# Patient Record
Sex: Male | Born: 1995 | Hispanic: No | Marital: Single | Smoking: Never smoker
Health system: Southern US, Community
[De-identification: ages and names within clinical notes are randomized; demographics above are authoritative.]

## PROBLEM LIST (undated history)

## (undated) DIAGNOSIS — K219 Gastro-esophageal reflux disease without esophagitis: Secondary | ICD-10-CM

## (undated) HISTORY — PX: BRAIN SURGERY: SHX531

## (undated) HISTORY — PX: NO PAST SURGERIES: SHX2092

## (undated) HISTORY — PX: WISDOM TOOTH EXTRACTION: SHX21

## (undated) HISTORY — PX: COLONOSCOPY: SHX174

---
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
## 2014-05-23 ENCOUNTER — Emergency Department (HOSPITAL_BASED_OUTPATIENT_CLINIC_OR_DEPARTMENT_OTHER): Payer: Self-pay

## 2014-05-23 ENCOUNTER — Encounter (HOSPITAL_BASED_OUTPATIENT_CLINIC_OR_DEPARTMENT_OTHER): Payer: Self-pay | Admitting: *Deleted

## 2014-05-23 ENCOUNTER — Emergency Department (HOSPITAL_BASED_OUTPATIENT_CLINIC_OR_DEPARTMENT_OTHER)
Admission: EM | Admit: 2014-05-23 | Discharge: 2014-05-23 | Disposition: A | Discharge status: Home / Self Care | Payer: Self-pay | Attending: Emergency Medicine | Admitting: Emergency Medicine

## 2014-05-23 DIAGNOSIS — Z880 Allergy status to penicillin: Secondary | ICD-10-CM | POA: Insufficient documentation

## 2014-05-23 DIAGNOSIS — S93401A Sprain of unspecified ligament of right ankle, initial encounter: Secondary | ICD-10-CM | POA: Insufficient documentation

## 2014-05-23 DIAGNOSIS — T1490XA Injury, unspecified, initial encounter: Secondary | ICD-10-CM

## 2014-05-23 DIAGNOSIS — Y9289 Other specified places as the place of occurrence of the external cause: Secondary | ICD-10-CM | POA: Insufficient documentation

## 2014-05-23 DIAGNOSIS — Y9367 Activity, basketball: Secondary | ICD-10-CM | POA: Insufficient documentation

## 2014-05-23 DIAGNOSIS — Y9389 Activity, other specified: Secondary | ICD-10-CM | POA: Insufficient documentation

## 2014-05-23 DIAGNOSIS — Y998 Other external cause status: Secondary | ICD-10-CM | POA: Insufficient documentation

## 2014-05-23 DIAGNOSIS — Z791 Long term (current) use of non-steroidal anti-inflammatories (NSAID): Secondary | ICD-10-CM | POA: Insufficient documentation

## 2014-05-23 DIAGNOSIS — X58XXXA Exposure to other specified factors, initial encounter: Secondary | ICD-10-CM | POA: Insufficient documentation

## 2014-05-23 MED ORDER — NAPROXEN 500 MG PO TABS: 500.0000 mg | ORAL_TABLET | Freq: Two times a day (BID) | ORAL | Status: DC

## 2014-05-23 NOTE — ED Provider Notes (Signed)
CSN: [id number]     Arrival date & time 05/23/14  1246 History   First MD Initiated Contact with Patient 05/23/14 1249     Chief Complaint  Patient presents with  . Foot Injury     (Consider location/radiation/quality/duration/timing/severity/associated sxs/prior Treatment) HPI Comments: Patient presents with complaint of right ankle and foot injury sustained yesterday at 3 PM while playing basketball. Patient states that he was going for layup when he came down he stepped on a brick and twisted his ankle. He denies other injury. Patient complains of pain, swelling, bruising to the area on the outside of the ankle. He is having difficulty bearing weight on the foot because of pain. He has been using crutches that he has had from previous injury. No other treatments prior to arrival. Onset of symptoms acute. Course is constant.  Patient is a 18 y.o. male presenting with foot injury. The history is provided by the patient.  Foot Injury Associated symptoms: no back pain and no neck pain     History reviewed. No pertinent past medical history. History reviewed. No pertinent past surgical history. History reviewed. No pertinent family history. History  Substance Use Topics  . Smoking status: Never Smoker   . Smokeless tobacco: Not on file  . Alcohol Use: No    Review of Systems  Constitutional: Negative for activity change.  Musculoskeletal: Positive for joint swelling and arthralgias. Negative for back pain, gait problem and neck pain.  Skin: Negative for wound.  Neurological: Negative for weakness and numbness.      Allergies  Penicillins  Home Medications   Prior to Admission medications   Medication Sig Start Date End Date Taking? Authorizing Provider  naproxen (NAPROSYN) 500 MG tablet Take 1 tablet (500 mg total) by mouth 2 (two) times daily. 05/23/14   Renne CriglerJoshua Mehlani Blankenburg, PA-C   BP 152/93 mmHg  Pulse 86  Temp(Src) 98.3 F (36.8 C)  Resp 16  Ht 5\' 11"  (1.803 m)  Wt 202 lb  (91.627 kg)  BMI 28.19 kg/m2  SpO2 99% Physical Exam  Constitutional: He appears well-developed and well-nourished.  HENT:  Head: Normocephalic and atraumatic.  Eyes: Conjunctivae are normal.  Neck: Normal range of motion. Neck supple.  Cardiovascular:  Pulses:      Dorsalis pedis pulses are 2+ on the right side, and 2+ on the left side.       Posterior tibial pulses are 2+ on the right side, and 2+ on the left side.  Pulmonary/Chest: No respiratory distress.  Musculoskeletal: He exhibits edema and tenderness.  Patient complains of pain with palpation of the lateral right ankle. Swelling over lateral malleolus. Mild laxity noted. He denies pain with palpation over the fibular head of the affected side. He denies pain in the hip of the affected side.  Neurological: He is alert.  Distal motor, sensation, and vascular intact.  Skin: Skin is warm and dry.  Psychiatric: He has a normal mood and affect.  Vitals reviewed.   ED Course  Procedures (including critical care time) Labs Review Labs Reviewed - No data to display  Imaging Review Dg Ankle Complete Right  05/23/2014   CLINICAL DATA:  Ankle injury, trauma  EXAM: RIGHT ANKLE - COMPLETE 3+ VIEW  COMPARISON:  None.  FINDINGS: There is no evidence of fracture, dislocation, or joint effusion. There is no evidence of arthropathy or other focal bone abnormality. Lateral greater than medial soft tissue swelling noted.  IMPRESSION: Lateral greater than medial soft tissue swelling which may  indicate ligamentous injury or occult fracture.   Electronically Signed   By: Christiana PellantGretchen  Green M.D.   On: 05/23/2014 13:23     EKG Interpretation None       1:43 PM Patient seen and examined.    Vital signs reviewed and are as follows: BP 152/93 mmHg  Pulse 86  Temp(Src) 98.3 F (36.8 C)  Resp 16  Ht 5\' 11"  (1.803 m)  Wt 202 lb (91.627 kg)  BMI 28.19 kg/m2  SpO2 99%  Patient was counseled on RICE protocol and told to rest injury, use ice for  no longer than 15 minutes every hour, compress the area, and elevate above the level of their heart as much as possible to reduce swelling. Questions answered. Patient verbalized understanding.    Encouraged to continue crutches. ASO given in emergency department. Patient encouraged to follow-up with orthopedic referral if no improvement in one week.   MDM   Final diagnoses:  Ankle sprain, right, initial encounter   Patient with ankle injury, foot is neurovascularly intact, x-rays are negative. Will treat as sprain. Orthopedic follow-up given if no improvement in one week.    Renne CriglerJoshua Tyra Michelle, PA-C 05/23/14 1346  Enid SkeensJoshua M Zavitz, MD 05/23/14 [phone]

## 2014-05-23 NOTE — Discharge Instructions (Signed)
Please read and follow all provided instructions.  Your diagnoses today include:  1. Ankle sprain, right, initial encounter   2. Injury    Tests performed today include:  An x-ray of your ankle - does NOT show any broken bones  Vital signs. See below for your results today.   Medications prescribed:   Naproxen - anti-inflammatory pain medication  Do not exceed 500mg  naproxen every 12 hours, take with food  You have been prescribed an anti-inflammatory medication or NSAID. Take with food. Take smallest effective dose for the shortest duration needed for your pain. Stop taking if you experience stomach pain or vomiting.   Take any prescribed medications only as directed.  Home care instructions:   Follow any educational materials contained in this packet  Follow R.I.C.E. Protocol:  R - rest your injury   I  - use ice on injury without applying directly to skin  C - compress injury with bandage or splint  E - elevate the injury as much as possible  Follow-up instructions: Please follow-up with your primary care provider or the provided orthopedic (bone specialist) if you continue to have significant pain or trouble walking in 1 week. In this case you may have a severe sprain that requires further care.   Return instructions:   Please return if your toes are numb or tingling, appear gray or blue, or you have severe pain (also elevate leg and loosen splint or wrap)  Please return to the Emergency Department if you experience worsening symptoms.   Please return if you have any other emergent concerns.  Additional Information:  Your vital signs today were: BP 152/93 mmHg   Pulse 86   Temp(Src) 98.3 F (36.8 C)   Resp 16   Ht 5\' 11"  (1.803 m)   Wt 202 lb (91.627 kg)   BMI 28.19 kg/m2   SpO2 99% If your blood pressure (BP) was elevated above 135/85 this visit, please have this repeated by your doctor within one month. -------------- Your caregiver has diagnosed you as  suffering from an ankle sprain. Ankle sprain occurs when the ligaments that hold the ankle joint together are stretched or torn. It may take 4 to 6 weeks to heal.  For Activity: If prescribed crutches, use crutches with non-weight bearing for the first few days. Then, you may walk on your ankle as the pain allows, or as instructed. Start gradually with weight bearing on the affected ankle. Once you can walk pain free, then try jogging. When you can run forwards, then you can try moving side-to-side. If you cannot walk without crutches in one week, you need a re-check. --------------

## 2014-05-23 NOTE — ED Notes (Signed)
Pt c/o right ankle injury x 1 day ago 

## 2016-11-04 ENCOUNTER — Emergency Department (HOSPITAL_BASED_OUTPATIENT_CLINIC_OR_DEPARTMENT_OTHER): Payer: Self-pay

## 2016-11-04 ENCOUNTER — Emergency Department (HOSPITAL_BASED_OUTPATIENT_CLINIC_OR_DEPARTMENT_OTHER)
Admission: EM | Admit: 2016-11-04 | Discharge: 2016-11-04 | Disposition: A | Discharge status: Home / Self Care | Payer: Self-pay | Attending: Emergency Medicine | Admitting: Emergency Medicine

## 2016-11-04 ENCOUNTER — Encounter (HOSPITAL_BASED_OUTPATIENT_CLINIC_OR_DEPARTMENT_OTHER): Payer: Self-pay | Admitting: *Deleted

## 2016-11-04 DIAGNOSIS — Y9367 Activity, basketball: Secondary | ICD-10-CM | POA: Insufficient documentation

## 2016-11-04 DIAGNOSIS — X501XXA Overexertion from prolonged static or awkward postures, initial encounter: Secondary | ICD-10-CM | POA: Insufficient documentation

## 2016-11-04 DIAGNOSIS — Y929 Unspecified place or not applicable: Secondary | ICD-10-CM | POA: Insufficient documentation

## 2016-11-04 DIAGNOSIS — Y999 Unspecified external cause status: Secondary | ICD-10-CM | POA: Insufficient documentation

## 2016-11-04 DIAGNOSIS — S8992XA Unspecified injury of left lower leg, initial encounter: Secondary | ICD-10-CM | POA: Insufficient documentation

## 2016-11-04 MED ORDER — IBUPROFEN 800 MG PO TABS: 800.0000 mg | ORAL_TABLET | Freq: Three times a day (TID) | ORAL | 0 refills | Status: DC | PRN

## 2016-11-04 MED ORDER — DOCUSATE SODIUM 100 MG PO CAPS: 100.0000 mg | ORAL_CAPSULE | Freq: Two times a day (BID) | ORAL | 0 refills | Status: DC

## 2016-11-04 MED ORDER — OXYCODONE-ACETAMINOPHEN 5-325 MG PO TABS: 1.0000 | ORAL_TABLET | Freq: Four times a day (QID) | ORAL | 0 refills | Status: DC | PRN

## 2016-11-04 MED ORDER — OXYCODONE-ACETAMINOPHEN 5-325 MG PO TABS
2.0000 | ORAL_TABLET | Freq: Once | ORAL | Status: AC
  Administered 2016-11-04: 2 via ORAL
  Filled 2016-11-04: qty 2

## 2016-11-04 MED ORDER — IBUPROFEN 800 MG PO TABS
800.0000 mg | ORAL_TABLET | Freq: Once | ORAL | Status: AC
  Administered 2016-11-04: 800 mg via ORAL
  Filled 2016-11-04: qty 1

## 2016-11-04 NOTE — Discharge Instructions (Signed)
You are being provided a prescription for opiates (also known as narcotics) for pain control.  Opiates can be addictive and should only be used when absolutely necessary for pain control when other alternatives do not work.  We recommend you only use them for the recommended amount of time and only as prescribed.  Please do not take with other sedative medications or alcohol.  Please do not drive, operate machinery, make important decisions while taking opiates.  Please note that these medications can be addictive and have high abuse potential.  Please keep these medications locked away from children, teenagers or any family members with history of substance abuse.     Narcotics may also make you constipated. This is why we are prescribing you a stool softener to take twice a day. If this does not help with bowel movements, you may use over-the-counter MiraLAX as well.   Your x-ray showed no fracture. I am concerned however for a possible anterior cruciate ligament injury and have recommended close follow-up with orthopedic doctor for further evaluation, possible MRI.

## 2016-11-04 NOTE — ED Provider Notes (Signed)
TIME SEEN: 3:48 AM  CHIEF COMPLAINT: Left knee pain  HPI: Patient is a 21 year old male with no significant past medical history who presents to the emergency department with left knee pain. Reports he was playing basketball prior to arrival and jumped up for a shot and then came down, planted his foot and twisted his left knee. States he felt a pop and had immediate swelling to the left knee and has now been able to ambulate since. No ankle or hip pain. Did not follow the ground or strike his head. No neck or back pain.  ROS: See HPI Constitutional: no fever  Eyes: no drainage  ENT: no runny nose   Cardiovascular:  no chest pain  Resp: no SOB  GI: no vomiting GU: no dysuria Integumentary: no rash  Allergy: no hives  Musculoskeletal: no leg swelling  Neurological: no slurred speech ROS otherwise negative  PAST MEDICAL HISTORY/PAST SURGICAL HISTORY:  History reviewed. No pertinent past medical history.  MEDICATIONS:  Prior to Admission medications   Medication Sig Start Date End Date Taking? Authorizing Provider  naproxen (NAPROSYN) 500 MG tablet Take 1 tablet (500 mg total) by mouth 2 (two) times daily. 05/23/14   Renne Crigler, PA-C    ALLERGIES:  Allergies  Allergen Reactions  . Penicillins     SOCIAL HISTORY:  Social History  Substance Use Topics  . Smoking status: Never Smoker  . Smokeless tobacco: Never Used  . Alcohol use No    FAMILY HISTORY: No family history on file.  EXAM: BP 134/64 (BP Location: Right Arm)   Pulse 79   Temp 98.3 F (36.8 C) (Oral)   Resp 16   Ht 6' (1.829 m)   Wt 225 lb (102.1 kg)   SpO2 98%   BMI 30.52 kg/m  CONSTITUTIONAL: Alert and oriented and responds appropriately to questions. Well-appearing; well-nourished; GCS 15 HEAD: Normocephalic; atraumatic EYES: Conjunctivae clear, PERRL, EOMI ENT: normal nose; no rhinorrhea; moist mucous membranes; pharynx without lesions noted; no dental injury; no septal hematoma NECK: Supple,  no meningismus, no LAD; no midline spinal tenderness, step-off or deformity; trachea midline CARD: RRR; S1 and S2 appreciated; no murmurs, no clicks, no rubs, no gallops RESP: Normal chest excursion without splinting or tachypnea; breath sounds clear and equal bilaterally; no wheezes, no rhonchi, no rales; no hypoxia or respiratory distress CHEST:  chest wall stable, no crepitus or ecchymosis or deformity, nontender to palpation; no flail chest ABD/GI: Normal bowel sounds; non-distended; soft, non-tender, no rebound, no guarding; no ecchymosis or other lesions noted PELVIS:  stable, nontender to palpation BACK:  The back appears normal and is non-tender to palpation, there is no CVA tenderness; no midline spinal tenderness, step-off or deformity EXT: Tender to palpation diffusely throughout the anterior left knee with associated mild-to-moderate joint effusion. He seems to have some ligamentous laxity with Lachman's test on the left. No other ligamentous laxity noted. 2+ DP pulses bilaterally. No tenderness over the left hip, left ankle. Patient is able to fully flex and extend this knee although it is painful. Normal ROM in all joints; otherwise extremities are non-tender to palpation; no edema; normal capillary refill; no cyanosis, otherwise no bony tenderness or bony deformity of patient's extremities, otherwise no joint effusion, compartments are soft, extremities are warm and well-perfused, no ecchymosis SKIN: Normal color for age and race; warm NEURO: Moves all extremities equally, sensation to light touch intact diffusely, cranial nerves II through XII intact, normal speech PSYCH: The patient's mood and manner are  appropriate. Grooming and personal hygiene are appropriate.  MEDICAL DECISION MAKING: Patient here with left knee injury. Concern for possible anterior cruciate ligament take a given mechanism and that he has some ligamentous laxity with Lachman's test compared to the right side. X-ray  shows no acute abnormality. We'll place him in a knee immobilizer and discharge with prescription for pain medication. We'll keep him nonweightbearing and provided crutches. Have recommended close follow-up with orthopedic surgeon as he may need an MRI of this joint. Discussed return precautions. He is neurovascular intact distally. No other sign of trauma on exam.   At this time, I do not feel there is any life-threatening condition present. I have reviewed and discussed all results (EKG, imaging, lab, urine as appropriate) and exam findings with patient/family. I have reviewed nursing notes and appropriate previous records.  I feel the patient is safe to be discharged home without further emergent workup and can continue workup as an outpatient as needed. Discussed usual and customary return precautions. Patient/family verbalize understanding and are comfortable with this plan.  Outpatient follow-up has been provided if needed. All questions have been answered.    SPLINT APPLICATION Authorized by: Raelyn NumberWARD, Theran Vandergrift N Consent: Verbal consent obtained. Risks and benefits: risks, benefits and alternatives were discussed Consent given by: patient Splint applied by: technician Location details: left leg Splint type: Knee immobilizer  Supplies used: Knee immobilizer  Post-procedure: The splinted body part was neurovascularly unchanged following the procedure. Patient tolerance: Patient tolerated the procedure well with no immediate complications.          Abia Monaco, Layla MawKristen N, DO 11/04/16 [phone]

## 2016-11-04 NOTE — ED Notes (Signed)
PMS intact before and after. Pt tolerated well. All questions answered. 

## 2016-11-08 ENCOUNTER — Other Ambulatory Visit: Payer: Self-pay | Admitting: Orthopedic Surgery

## 2016-11-08 DIAGNOSIS — S8992XA Unspecified injury of left lower leg, initial encounter: Secondary | ICD-10-CM

## 2016-11-13 ENCOUNTER — Ambulatory Visit: Payer: Self-pay | Attending: Internal Medicine | Admitting: Internal Medicine

## 2016-11-13 ENCOUNTER — Encounter: Payer: Self-pay | Admitting: Internal Medicine

## 2016-11-13 VITALS — BP 120/80 | HR 73 | Temp 97.4°F | Resp 16 | Wt 222.4 lb

## 2016-11-13 DIAGNOSIS — M25562 Pain in left knee: Secondary | ICD-10-CM | POA: Insufficient documentation

## 2016-11-13 DIAGNOSIS — S8992XA Unspecified injury of left lower leg, initial encounter: Secondary | ICD-10-CM | POA: Insufficient documentation

## 2016-11-13 DIAGNOSIS — Z880 Allergy status to penicillin: Secondary | ICD-10-CM | POA: Insufficient documentation

## 2016-11-13 DIAGNOSIS — M25462 Effusion, left knee: Secondary | ICD-10-CM | POA: Insufficient documentation

## 2016-11-13 NOTE — Progress Notes (Signed)
Patient ID: Karl Flores, male    DOB: 11-26-1995  MRN: [id number]  CC:  [email or web address]   Subjective:  Karl Flores is a 21 y.o. male who presents for new patient visit and follow-up ER visit. Mother is with him. Patient speaks [place]. His concerns today include: Patient was seen in the ER on 11/04/2016 with complaint of left knee pain. Pain started after he jumped up to make her short in basketball and then came down with a twisting of the knee. Knee was noted to be swollen and he did have some ligamentous laxity with Lachman's test.   -x-ray negative for fracture -Patient sent home with knee brace, crutches and pain medication. Told to follow up with orthopedics.  -swelling decreased slightly -saw ortho 5 days later and was told he most likely has torn ACL.   Told to use brace PRN, and slight wgh bearing with crutches.  Told he will most likely need surgery -MRI 11/21/2016 -Patient is uninsured  2.  BP noted to be elevated in the ER and also today. Fhx of BP in dad -no HA/dizziness/CP/SOB    Current Outpatient Prescriptions on File Prior to Visit  Medication Sig Dispense Refill  . docusate sodium (COLACE) 100 MG capsule Take 1 capsule (100 mg total) by mouth every 12 (twelve) hours. (Patient not taking: Reported on 11/13/2016) 60 capsule 0  . ibuprofen (ADVIL,MOTRIN) 800 MG tablet Take 1 tablet (800 mg total) by mouth every 8 (eight) hours as needed for mild pain. (Patient not taking: Reported on 11/13/2016) 30 tablet 0  . oxyCODONE-acetaminophen (PERCOCET/ROXICET) 5-325 MG tablet Take 1-2 tablets by mouth every 6 (six) hours as needed for severe pain. (Patient not taking: Reported on 11/13/2016) 20 tablet 0   No current facility-administered medications on file prior to visit.     Allergies  Allergen Reactions  . Penicillins      ROS: Review of Systems  Constitutional: Negative.   Cardiovascular: Negative for chest pain.  Neurological: Negative for dizziness and  light-headedness.     PHYSICAL EXAM: BP 120/80   Pulse 73   Temp 97.4 F (36.3 C) (Oral)   Resp 16   Wt 222 lb 6.4 oz (100.9 kg)   SpO2 98%   BMI 30.16 kg/m   Physical Exam  General appearance - alert, well appearing, young male and in no distress Mental status - normal mood, behavior, speech, dress, motor activity, and thought processes Chest - clear to auscultation, no wheezes, rales or rhonchi, symmetric air entry Heart - normal rate, regular rhythm, normal S1, S2, no murmurs, rubs, clicks or gallops Musculoskeletal - LT knee: mild edema. Mild point tenderness medial jt line.  Slight discomfort with passive flexion/extension Extremities - no edema in LE below knee  ASSESSMENT AND PLAN: 1. Knee injury, left, initial encounter -Assessed to have possible torn anterior cruciate ligament by orthopedics. He needs an appointment with financial counselor to [facility]. -Otherwise he will follow instructions provided by orthopedics.  2.  Repeat blood pressure reading was normal. DASH diet still advised. Patient was given the opportunity to ask questions.  Patient verbalized understanding of the plan and was able to repeat key elements of the plan.   No orders of the defined types were placed in this encounter.    Requested Prescriptions    No prescriptions requested or ordered in this encounter    Future Appointments Date Time Provider Department Center  11/14/2016 2:30 PM [facility] FINANCIAL COUNSELOR  2 [facility] None  11/21/2016 3:50 PM [facility] MR 3 [id number] [address] [facility]    Jonah Blue, MD, FACP

## 2016-11-13 NOTE — Patient Instructions (Signed)
Please give appointment with Ms. Karl Flores.   Normal blood pressure is 120/80. You can check your blood pressure about once a week to make sure it is staying 120/80 or lower.

## 2016-11-14 ENCOUNTER — Ambulatory Visit: Payer: Self-pay | Attending: Internal Medicine

## 2016-11-21 ENCOUNTER — Ambulatory Visit
Admission: RE | Admit: 2016-11-21 | Discharge: 2016-11-21 | Disposition: A | Discharge status: Home / Self Care | Payer: Self-pay | Source: Ambulatory Visit | Attending: Orthopedic Surgery | Admitting: Orthopedic Surgery

## 2016-11-21 DIAGNOSIS — S8992XA Unspecified injury of left lower leg, initial encounter: Secondary | ICD-10-CM

## 2016-11-28 ENCOUNTER — Encounter (INDEPENDENT_AMBULATORY_CARE_PROVIDER_SITE_OTHER): Payer: Self-pay | Admitting: Orthopedic Surgery

## 2016-11-28 ENCOUNTER — Ambulatory Visit (INDEPENDENT_AMBULATORY_CARE_PROVIDER_SITE_OTHER): Payer: Self-pay | Admitting: Orthopedic Surgery

## 2016-11-28 ENCOUNTER — Other Ambulatory Visit (INDEPENDENT_AMBULATORY_CARE_PROVIDER_SITE_OTHER): Payer: Self-pay | Admitting: Orthopedic Surgery

## 2016-11-28 DIAGNOSIS — M25562 Pain in left knee: Secondary | ICD-10-CM

## 2016-11-28 DIAGNOSIS — S83512A Sprain of anterior cruciate ligament of left knee, initial encounter: Secondary | ICD-10-CM

## 2016-11-28 MED ORDER — HYDROCODONE-ACETAMINOPHEN 5-325 MG PO TABS: 1.0000 | ORAL_TABLET | Freq: Three times a day (TID) | ORAL | 0 refills | Status: DC | PRN

## 2016-12-01 NOTE — Progress Notes (Signed)
Office Visit Note   Patient: Karl BlancoKhaled H Sollers           Date of Birth: 09/21/1995           MRN: [id number] Visit Date: 11/28/2016 Requested by: Marcine MatarJohnson, Deborah B, MD [address] [place], [place] [address] PCP: Marcine MatarJohnson, Deborah B, MD  Subjective: Chief Complaint  Patient presents with  . Left Knee - Injury    HPI: Complete is a patient who injured his left knee 11/04/2016 playing basketball.  He went to the emergency room.  Has had no prior injury.  [facility] which shows potential meniscal root avulsion laterally anterior cruciate ligament tear as well as some essential accessory ligament injury.  He reports symptomatic instability.  His mother and brother are at home.  He is going [place] in [place].  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  No family or personal history of DVT or pulmonary embolism              ROS: All systems reviewed are negative as they relate to the chief complaint within the history of present illness.  Patient denies  fevers or chills.   Assessment & Plan: Visit Diagnoses:  1. Acute pain of left knee     Plan: Impression is left knee anterior cruciate ligament tear with meniscal root avulsion and possible posterior lateral corner injury.  He does have increased external rotation on the left compared to the right at 30 of flexion.  Plan is anterior cruciate ligament reconstruction using semitendinosus autograft along with possible fibular based popliteal fibular ligament reconstruction.  Risks and benefits discussed including not limited to infection the stiffness nerve vessel damage as well as the potential for arthritis if meniscal root repair cannot be achieved.  Patient understands the risks and benefits and wishes to proceed with surgery.  All questions answered.  f  Follow-Up Instructions: No Follow-up on file.   Orders:  No orders of the defined types were placed in this encounter.  Meds ordered this  encounter  Medications  . HYDROcodone-acetaminophen (NORCO/VICODIN) 5-325 MG tablet    Sig: Take 1 tablet by mouth every 8 (eight) hours as needed for moderate pain.    Dispense:  30 tablet    Refill:  0      Procedures: No procedures performed   Clinical Data: No additional findings.  Objective: Vital Signs: There were no vitals taken for this visit.  Physical Exam:   Constitutional: Patient appears well-developed HEENT:  Head: Normocephalic Eyes:EOM are normal Neck: Normal range of motion Cardiovascular: Normal rate Pulmonary/chest: Effort normal Neurologic: Patient is alert Skin: Skin is warm Psychiatric: Patient has normal mood and affect    Ortho Exam: Orthopedic exam demonstrates antalgic gait to the left with mild left knee effusion he does have flexion to about 105 and extension nearly full active only full extension by about 5.  Collaterals feel stable at 0 and 30 to varus and valgus stress but he does have increased posterior lateral rotatory instability on the left knee at 30 of flexion compared to the right.  PCL is intact bilaterally but anterior cruciate ligament is out on that left hand side.  Extensor mechanism is intact.  Skin is intact in the left knee region.  Specialty Comments:  No specialty comments available.  Imaging: No results found.   PMFS History: There are no active problems to display for this patient.  No past medical history on file.  No family history on file.  No past surgical history on file. Social History   Occupational History  . Not on file.   Social History Main Topics  . Smoking status: Never Smoker  . Smokeless tobacco: Never Used  . Alcohol use No  . Drug use: No  . Sexual activity: No

## 2016-12-03 ENCOUNTER — Other Ambulatory Visit (HOSPITAL_COMMUNITY): Payer: Self-pay | Admitting: *Deleted

## 2016-12-03 ENCOUNTER — Encounter (HOSPITAL_COMMUNITY): Payer: Self-pay | Admitting: *Deleted

## 2016-12-03 MED ORDER — CLINDAMYCIN PHOSPHATE 900 MG/50ML IV SOLN
900.0000 mg | INTRAVENOUS | Status: AC
  Administered 2016-12-04: 900 mg via INTRAVENOUS
  Filled 2016-12-03: qty 50

## 2016-12-04 ENCOUNTER — Ambulatory Visit (HOSPITAL_COMMUNITY): Payer: Self-pay | Admitting: Anesthesiology

## 2016-12-04 ENCOUNTER — Encounter (HOSPITAL_COMMUNITY): Payer: Self-pay

## 2016-12-04 ENCOUNTER — Encounter (HOSPITAL_COMMUNITY)
Admission: RE | Disposition: A | Discharge status: Home / Self Care | Payer: Self-pay | Source: Ambulatory Visit | Attending: Orthopedic Surgery

## 2016-12-04 ENCOUNTER — Ambulatory Visit (HOSPITAL_COMMUNITY)
Admission: RE | Admit: 2016-12-04 | Discharge: 2016-12-04 | Disposition: A | Discharge status: Home / Self Care | Payer: Self-pay | Source: Ambulatory Visit | Attending: Orthopedic Surgery | Admitting: Orthopedic Surgery

## 2016-12-04 DIAGNOSIS — S83282A Other tear of lateral meniscus, current injury, left knee, initial encounter: Secondary | ICD-10-CM | POA: Insufficient documentation

## 2016-12-04 DIAGNOSIS — S83272D Complex tear of lateral meniscus, current injury, left knee, subsequent encounter: Secondary | ICD-10-CM

## 2016-12-04 DIAGNOSIS — S83512D Sprain of anterior cruciate ligament of left knee, subsequent encounter: Secondary | ICD-10-CM

## 2016-12-04 DIAGNOSIS — S83512A Sprain of anterior cruciate ligament of left knee, initial encounter: Secondary | ICD-10-CM | POA: Insufficient documentation

## 2016-12-04 DIAGNOSIS — Z880 Allergy status to penicillin: Secondary | ICD-10-CM | POA: Insufficient documentation

## 2016-12-04 DIAGNOSIS — S83522D Sprain of posterior cruciate ligament of left knee, subsequent encounter: Secondary | ICD-10-CM

## 2016-12-04 DIAGNOSIS — X58XXXA Exposure to other specified factors, initial encounter: Secondary | ICD-10-CM | POA: Insufficient documentation

## 2016-12-04 DIAGNOSIS — Z79891 Long term (current) use of opiate analgesic: Secondary | ICD-10-CM | POA: Insufficient documentation

## 2016-12-04 DIAGNOSIS — S83422A Sprain of lateral collateral ligament of left knee, initial encounter: Secondary | ICD-10-CM | POA: Insufficient documentation

## 2016-12-04 DIAGNOSIS — K219 Gastro-esophageal reflux disease without esophagitis: Secondary | ICD-10-CM | POA: Insufficient documentation

## 2016-12-04 HISTORY — DX: Gastro-esophageal reflux disease without esophagitis: K21.9

## 2016-12-04 HISTORY — PX: KNEE ARTHROSCOPY WITH ANTERIOR CRUCIATE LIGAMENT (ACL) REPAIR WITH HAMSTRING GRAFT: SHX5645

## 2016-12-04 HISTORY — PX: KNEE ARTHROSCOPY WITH MENISCAL REPAIR: SHX5653

## 2016-12-04 SURGERY — KNEE ARTHROSCOPY WITH ANTERIOR CRUCIATE LIGAMENT (ACL) REPAIR WITH HAMSTRING GRAFT
Anesthesia: General | Site: Knee | Laterality: Left

## 2016-12-04 MED ORDER — FENTANYL CITRATE (PF) 250 MCG/5ML IJ SOLN
INTRAMUSCULAR | Status: AC
Start: 2016-12-04 — End: 2016-12-04
  Filled 2016-12-04: qty 5

## 2016-12-04 MED ORDER — LACTATED RINGERS IV SOLN
INTRAVENOUS | Status: DC
  Administered 2016-12-04 (×3): via INTRAVENOUS

## 2016-12-04 MED ORDER — FENTANYL CITRATE (PF) 100 MCG/2ML IJ SOLN
100.0000 ug | Freq: Once | INTRAMUSCULAR | Status: AC
  Administered 2016-12-04: 100 ug via INTRAVENOUS
  Filled 2016-12-04 (×2): qty 2

## 2016-12-04 MED ORDER — LIDOCAINE 2% (20 MG/ML) 5 ML SYRINGE
INTRAMUSCULAR | Status: AC
  Filled 2016-12-04: qty 5

## 2016-12-04 MED ORDER — MIDAZOLAM HCL 2 MG/2ML IJ SOLN
INTRAMUSCULAR | Status: AC
  Filled 2016-12-04: qty 2

## 2016-12-04 MED ORDER — EPINEPHRINE PF 1 MG/ML IJ SOLN
INTRAMUSCULAR | Status: AC
  Filled 2016-12-04: qty 3

## 2016-12-04 MED ORDER — BUPIVACAINE HCL (PF) 0.25 % IJ SOLN
INTRAMUSCULAR | Status: AC
  Filled 2016-12-04: qty 30

## 2016-12-04 MED ORDER — ROCURONIUM BROMIDE 10 MG/ML (PF) SYRINGE
PREFILLED_SYRINGE | INTRAVENOUS | Status: AC
  Filled 2016-12-04: qty 5

## 2016-12-04 MED ORDER — HYDROMORPHONE HCL 1 MG/ML IJ SOLN
0.2500 mg | INTRAMUSCULAR | Status: DC | PRN
  Administered 2016-12-04 (×2): 0.5 mg via INTRAVENOUS

## 2016-12-04 MED ORDER — BUPIVACAINE-EPINEPHRINE (PF) 0.5% -1:200000 IJ SOLN
INTRAMUSCULAR | Status: AC
  Filled 2016-12-04: qty 30

## 2016-12-04 MED ORDER — DEXAMETHASONE SODIUM PHOSPHATE 10 MG/ML IJ SOLN
INTRAMUSCULAR | Status: DC | PRN
  Administered 2016-12-04: 10 mg via INTRAVENOUS

## 2016-12-04 MED ORDER — SODIUM CHLORIDE 0.9 % IR SOLN
Status: DC | PRN
  Administered 2016-12-04: 12000 mL

## 2016-12-04 MED ORDER — KETOROLAC TROMETHAMINE 30 MG/ML IJ SOLN
30.0000 mg | Freq: Once | INTRAMUSCULAR | Status: AC
  Administered 2016-12-04: 30 mg via INTRAVENOUS

## 2016-12-04 MED ORDER — ONDANSETRON HCL 4 MG/2ML IJ SOLN
INTRAMUSCULAR | Status: DC | PRN
  Administered 2016-12-04: 4 mg via INTRAVENOUS

## 2016-12-04 MED ORDER — MIDAZOLAM HCL 5 MG/5ML IJ SOLN
INTRAMUSCULAR | Status: DC | PRN
  Administered 2016-12-04: 2 mg via INTRAVENOUS

## 2016-12-04 MED ORDER — KETOROLAC TROMETHAMINE 30 MG/ML IJ SOLN
INTRAMUSCULAR | Status: AC
  Administered 2016-12-04: 30 mg via INTRAVENOUS
  Filled 2016-12-04: qty 1

## 2016-12-04 MED ORDER — ACETAMINOPHEN 10 MG/ML IV SOLN
1000.0000 mg | Freq: Once | INTRAVENOUS | Status: AC
  Administered 2016-12-04: 1000 mg via INTRAVENOUS

## 2016-12-04 MED ORDER — FENTANYL CITRATE (PF) 250 MCG/5ML IJ SOLN
INTRAMUSCULAR | Status: DC | PRN
  Administered 2016-12-04: 25 ug via INTRAVENOUS
  Administered 2016-12-04 (×6): 50 ug via INTRAVENOUS
  Administered 2016-12-04: 100 ug via INTRAVENOUS
  Administered 2016-12-04: 75 ug via INTRAVENOUS

## 2016-12-04 MED ORDER — ACETAMINOPHEN 10 MG/ML IV SOLN
INTRAVENOUS | Status: AC
  Administered 2016-12-04: 1000 mg via INTRAVENOUS
  Filled 2016-12-04: qty 100

## 2016-12-04 MED ORDER — EPINEPHRINE PF 1 MG/ML IJ SOLN
INTRAMUSCULAR | Status: AC
  Filled 2016-12-04: qty 1

## 2016-12-04 MED ORDER — MORPHINE SULFATE (PF) 4 MG/ML IV SOLN
INTRAVENOUS | Status: AC
  Filled 2016-12-04: qty 1

## 2016-12-04 MED ORDER — HYDROMORPHONE HCL 1 MG/ML IJ SOLN
INTRAMUSCULAR | Status: AC
  Administered 2016-12-04: 0.5 mg via INTRAVENOUS
  Filled 2016-12-04: qty 1

## 2016-12-04 MED ORDER — CHLORHEXIDINE GLUCONATE 4 % EX LIQD: 60.0000 mL | Freq: Once | CUTANEOUS | Status: DC

## 2016-12-04 MED ORDER — SUGAMMADEX SODIUM 200 MG/2ML IV SOLN
INTRAVENOUS | Status: DC | PRN
  Administered 2016-12-04: 200 mg via INTRAVENOUS

## 2016-12-04 MED ORDER — PROPOFOL 10 MG/ML IV BOLUS
INTRAVENOUS | Status: AC
  Filled 2016-12-04: qty 20

## 2016-12-04 MED ORDER — MIDAZOLAM HCL 2 MG/2ML IJ SOLN
2.0000 mg | Freq: Once | INTRAMUSCULAR | Status: AC
  Administered 2016-12-04: 2 mg via INTRAVENOUS
  Filled 2016-12-04 (×2): qty 2

## 2016-12-04 MED ORDER — LIDOCAINE 2% (20 MG/ML) 5 ML SYRINGE
INTRAMUSCULAR | Status: DC | PRN
  Administered 2016-12-04: 100 mg via INTRAVENOUS

## 2016-12-04 MED ORDER — ROCURONIUM BROMIDE 10 MG/ML (PF) SYRINGE
PREFILLED_SYRINGE | INTRAVENOUS | Status: DC | PRN
Start: 2016-12-04 — End: 2016-12-04
  Administered 2016-12-04 (×2): 20 mg via INTRAVENOUS
  Administered 2016-12-04: 50 mg via INTRAVENOUS
  Administered 2016-12-04: 20 mg via INTRAVENOUS
  Administered 2016-12-04 (×2): 10 mg via INTRAVENOUS

## 2016-12-04 MED ORDER — BUPIVACAINE HCL (PF) 0.25 % IJ SOLN
INTRAMUSCULAR | Status: DC | PRN
  Administered 2016-12-04: 20 mL

## 2016-12-04 MED ORDER — BUPIVACAINE-EPINEPHRINE (PF) 0.5% -1:200000 IJ SOLN
INTRAMUSCULAR | Status: DC | PRN
  Administered 2016-12-04: 10 mL

## 2016-12-04 MED ORDER — MORPHINE SULFATE (PF) 4 MG/ML IV SOLN
INTRAVENOUS | Status: DC | PRN
  Administered 2016-12-04: 8 ug

## 2016-12-04 MED ORDER — BUPIVACAINE-EPINEPHRINE (PF) 0.5% -1:200000 IJ SOLN
INTRAMUSCULAR | Status: DC | PRN
  Administered 2016-12-04: 30 mL via PERINEURAL

## 2016-12-04 MED ORDER — FENTANYL CITRATE (PF) 250 MCG/5ML IJ SOLN
INTRAMUSCULAR | Status: AC
  Filled 2016-12-04: qty 5

## 2016-12-04 MED ORDER — CLONIDINE HCL (ANALGESIA) 100 MCG/ML EP SOLN
150.0000 ug | Freq: Once | EPIDURAL | Status: AC
  Administered 2016-12-04: 100 ug via INTRA_ARTICULAR
  Filled 2016-12-04: qty 1.5

## 2016-12-04 MED ORDER — SODIUM CHLORIDE 0.9 % IR SOLN
Status: DC | PRN
  Administered 2016-12-04: 6000 mL

## 2016-12-04 MED ORDER — PROPOFOL 10 MG/ML IV BOLUS
INTRAVENOUS | Status: DC | PRN
  Administered 2016-12-04: 200 mg via INTRAVENOUS

## 2016-12-04 SURGICAL SUPPLY — 92 items
ALCOHOL 70% 16 OZ (MISCELLANEOUS) ×4 IMPLANT
ANCHOR BUTTON TIGHTROPE ACL RT (Orthopedic Implant) ×8 IMPLANT
ANCHOR SUT BIO SW 4.75X19.1 (Anchor) ×8 IMPLANT
BANDAGE ACE 4X5 VEL STRL LF (GAUZE/BANDAGES/DRESSINGS) ×4 IMPLANT
BANDAGE ACE 6X5 VEL STRL LF (GAUZE/BANDAGES/DRESSINGS) ×4 IMPLANT
BANDAGE ESMARK 6X9 LF (GAUZE/BANDAGES/DRESSINGS) ×2 IMPLANT
BLADE CLIPPER SURG (BLADE) ×4 IMPLANT
BLADE CUTTER GATOR 3.5 (BLADE) IMPLANT
BLADE GREAT WHITE 4.2 (BLADE) ×21 IMPLANT
BLADE GREAT WHITE 4.2MM (BLADE) ×7
BLADE SURG 10 STRL SS (BLADE) ×4 IMPLANT
BLADE SURG 15 STRL LF DISP TIS (BLADE) ×2 IMPLANT
BLADE SURG 15 STRL SS (BLADE) ×2
BNDG ESMARK 6X9 LF (GAUZE/BANDAGES/DRESSINGS) ×4
BUR OVAL 6.0 (BURR) IMPLANT
CLOSURE WOUND 1/2 X4 (GAUZE/BANDAGES/DRESSINGS) ×1
COVER MAYO STAND STRL (DRAPES) ×8 IMPLANT
COVER SURGICAL LIGHT HANDLE (MISCELLANEOUS) ×4 IMPLANT
CUFF TOURNIQUET SINGLE 34IN LL (TOURNIQUET CUFF) ×4 IMPLANT
CUFF TOURNIQUET SINGLE 44IN (TOURNIQUET CUFF) IMPLANT
DRAPE ARTHROSCOPY W/POUCH 114 (DRAPES) ×4 IMPLANT
DRAPE HALF SHEET 40X57 (DRAPES) IMPLANT
DRAPE INCISE IOBAN 66X45 STRL (DRAPES) ×4 IMPLANT
DRAPE U-SHAPE 47X51 STRL (DRAPES) ×4 IMPLANT
DRILL FLIPCUTTER II 8.0MM (INSTRUMENTS) ×2 IMPLANT
DRSG TEGADERM 4X4.75 (GAUZE/BANDAGES/DRESSINGS) ×20 IMPLANT
DURAPREP 26ML APPLICATOR (WOUND CARE) ×4 IMPLANT
ELECT REM PT RETURN 9FT ADLT (ELECTROSURGICAL) ×4
ELECTRODE REM PT RTRN 9FT ADLT (ELECTROSURGICAL) ×2 IMPLANT
FLIPCUTTER II 8.0MM (INSTRUMENTS) ×4
GAUZE SPONGE 4X4 12PLY STRL (GAUZE/BANDAGES/DRESSINGS) ×4 IMPLANT
GAUZE XEROFORM 1X8 LF (GAUZE/BANDAGES/DRESSINGS) IMPLANT
GAUZE XEROFORM 5X9 LF (GAUZE/BANDAGES/DRESSINGS) ×4 IMPLANT
GEL DBM ALLOFUSE 1CC INJECT (Bone Implant) ×8 IMPLANT
GLOVE BIOGEL PI IND STRL 7.5 (GLOVE) ×2 IMPLANT
GLOVE BIOGEL PI IND STRL 8 (GLOVE) ×2 IMPLANT
GLOVE BIOGEL PI INDICATOR 7.5 (GLOVE) ×2
GLOVE BIOGEL PI INDICATOR 8 (GLOVE) ×2
GLOVE ECLIPSE 7.0 STRL STRAW (GLOVE) ×4 IMPLANT
GLOVE SURG ORTHO 8.0 STRL STRW (GLOVE) ×4 IMPLANT
GOWN STRL REUS W/ TWL LRG LVL3 (GOWN DISPOSABLE) ×4 IMPLANT
GOWN STRL REUS W/ TWL XL LVL3 (GOWN DISPOSABLE) ×2 IMPLANT
GOWN STRL REUS W/TWL LRG LVL3 (GOWN DISPOSABLE) ×4
GOWN STRL REUS W/TWL XL LVL3 (GOWN DISPOSABLE) ×2
IMMOBILIZER KNEE 22 UNIV (SOFTGOODS) ×4 IMPLANT
KIT BASIN OR (CUSTOM PROCEDURE TRAY) ×4 IMPLANT
KIT BIO-TENODESIS 3X8 DISP (MISCELLANEOUS) ×2
KIT BIOCARTILAGE LG JOINT MIX (KITS) ×4 IMPLANT
KIT IMPLANT TIGHTROPE ABS OPEN (Anchor) IMPLANT
KIT INSRT BABSR STRL DISP BTN (MISCELLANEOUS) ×2 IMPLANT
KIT RETRO BUTTON TIGHTROPE ABS (Anchor) ×4 IMPLANT
KIT ROOM TURNOVER OR (KITS) ×4 IMPLANT
LOOP VESSEL MAXI BLUE (MISCELLANEOUS) ×4 IMPLANT
MANIFOLD NEPTUNE II (INSTRUMENTS) IMPLANT
NEEDLE 18GX1X1/2 (RX/OR ONLY) (NEEDLE) IMPLANT
NEEDLE HYPO 25GX1X1/2 BEV (NEEDLE) ×4 IMPLANT
NEEDLE SUT 2-0 SCORPION KNEE (NEEDLE) IMPLANT
NS IRRIG 1000ML POUR BTL (IV SOLUTION) ×4 IMPLANT
PACK ARTHROSCOPY DSU (CUSTOM PROCEDURE TRAY) ×4 IMPLANT
PAD ARMBOARD 7.5X6 YLW CONV (MISCELLANEOUS) ×8 IMPLANT
PADDING CAST COTTON 6X4 STRL (CAST SUPPLIES) IMPLANT
PENCIL BUTTON HOLSTER BLD 10FT (ELECTRODE) ×4 IMPLANT
PK GRAFTLINK AUTO IMPLANT SYST (Anchor) ×4 IMPLANT
SET ARTHROSCOPY TUBING (MISCELLANEOUS) ×2
SET ARTHROSCOPY TUBING LN (MISCELLANEOUS) ×2 IMPLANT
SOL PREP POV-IOD 4OZ 10% (MISCELLANEOUS) ×4 IMPLANT
SPONGE LAP 18X18 X RAY DECT (DISPOSABLE) ×4 IMPLANT
SPONGE LAP 4X18 X RAY DECT (DISPOSABLE) ×12 IMPLANT
STRIP CLOSURE SKIN 1/2X4 (GAUZE/BANDAGES/DRESSINGS) ×3 IMPLANT
SUCTION FRAZIER HANDLE 10FR (MISCELLANEOUS) ×2
SUCTION TUBE FRAZIER 10FR DISP (MISCELLANEOUS) ×2 IMPLANT
SUT ETHILON 3 0 FSL (SUTURE) ×4 IMPLANT
SUT ETHILON 3 0 PS 1 (SUTURE) IMPLANT
SUT FIBERWIRE 2-0 18 17.9 3/8 (SUTURE)
SUT MENISCAL KIT (KITS) IMPLANT
SUT MNCRL AB 3-0 PS2 18 (SUTURE) ×16 IMPLANT
SUT VIC AB 0 CT1 27 (SUTURE) ×2
SUT VIC AB 0 CT1 27XBRD ANBCTR (SUTURE) ×2 IMPLANT
SUT VIC AB 2-0 CT1 27 (SUTURE) ×12
SUT VIC AB 2-0 CT1 TAPERPNT 27 (SUTURE) ×12 IMPLANT
SUTURE FIBERWR 2-0 18 17.9 3/8 (SUTURE) IMPLANT
SYR 20ML ECCENTRIC (SYRINGE) ×4 IMPLANT
SYR CONTROL 10ML LL (SYRINGE) IMPLANT
SYR TB 1ML LUER SLIP (SYRINGE) ×4 IMPLANT
SYSTEM GRAFT IMPLANT AUTOGRAFT (Anchor) ×2 IMPLANT
TOWEL OR 17X24 6PK STRL BLUE (TOWEL DISPOSABLE) ×4 IMPLANT
TOWEL OR 17X26 10 PK STRL BLUE (TOWEL DISPOSABLE) ×4 IMPLANT
TUBE CONNECTING 12'X1/4 (SUCTIONS) ×1
TUBE CONNECTING 12X1/4 (SUCTIONS) ×3 IMPLANT
WAND HAND CNTRL MULTIVAC 90 (MISCELLANEOUS) IMPLANT
WATER STERILE IRR 1000ML POUR (IV SOLUTION) ×4 IMPLANT
WRAP KNEE MAXI GEL POST OP (GAUZE/BANDAGES/DRESSINGS) ×4 IMPLANT

## 2016-12-04 NOTE — Anesthesia Procedure Notes (Signed)
Anesthesia Regional Block: Adductor canal block   Pre-Anesthetic Checklist: ,, timeout performed, Correct Patient, Correct Site, Correct Laterality, Correct Procedure, Correct Position, site marked, Risks and benefits discussed, pre-op evaluation,  At surgeon's request and post-op pain management  Laterality: Left  Prep: Maximum Sterile Barrier Precautions used, chloraprep       Needles:  Injection technique: Single-shot  Needle Type: Echogenic Stimulator Needle     Needle Length: 9cm  Needle Gauge: 21     Additional Needles:   Procedures: ultrasound guided,,,,,,,,  Narrative:  Start time: 12/04/2016 10:21 AM End time: 12/04/2016 10:31 AM Injection made incrementally with aspirations every 5 mL. Anesthesiologist: Gaynelle AduFITZGERALD, Prudie Guthridge  Additional Notes: 2% Lidocaine skin wheel.

## 2016-12-04 NOTE — H&P (Signed)
Karl Flores is an 21 y.o. male.   Chief Complaint: Left knee pain HPI: Patient is a 21 year old patient with left knee pain.  He injured his left knee proximally 6 weeks ago.  MRI scan shows lateral meniscal tear and anterior cruciate ligament tear.  On my examination in the office the patient had some increased external rotation at approximately 30 of flexion on the left compared to the right.  He does report symptomatically instability in the left knee.  He desires surgical correction.  He denies any personal or family history of DVT or pulmonary embolism.  He does need to travel in September.  Past Medical History:  Diagnosis Date  . GERD (gastroesophageal reflux disease)    occ    Past Surgical History:  Procedure Laterality Date  . NO PAST SURGERIES      History reviewed. No pertinent family history. Social History:  reports that he has never smoked. He has never used smokeless tobacco. He reports that he does not drink alcohol or use drugs.  Allergies:  Allergies  Allergen Reactions  . Penicillins Other (See Comments)    Has patient had a PCN reaction causing immediate rash, facial/tongue/throat swelling, SOB or lightheadedness with hypotension: Unknown Has patient had a PCN reaction causing severe rash involving mucus membranes or skin necrosis: Unknown Has patient had a PCN reaction that required hospitalization: Unknown Has patient had a PCN reaction occurring within the last 10 years: No Unknown Childhood reaction  If all of the above answers are "NO", then may proceed with Cephalosporin use.      Medications Prior to Admission  Medication Sig Dispense Refill  . HYDROcodone-acetaminophen (NORCO/VICODIN) 5-325 MG tablet Take 1 tablet by mouth every 8 (eight) hours as needed for moderate pain. 30 tablet 0  . docusate sodium (COLACE) 100 MG capsule Take 1 capsule (100 mg total) by mouth every 12 (twelve) hours. (Patient not taking: Reported on 11/13/2016) 60 capsule 0  .  ibuprofen (ADVIL,MOTRIN) 800 MG tablet Take 1 tablet (800 mg total) by mouth every 8 (eight) hours as needed for mild pain. (Patient not taking: Reported on 11/13/2016) 30 tablet 0  . oxyCODONE-acetaminophen (PERCOCET/ROXICET) 5-325 MG tablet Take 1-2 tablets by mouth every 6 (six) hours as needed for severe pain. (Patient not taking: Reported on 11/13/2016) 20 tablet 0    No results found for this or any previous visit (from the past 48 hour(s)). No results found.  Review of Systems  Musculoskeletal: Positive for joint pain.  All other systems reviewed and are negative.   Blood pressure (!) 141/90, pulse 73, temperature 98.4 F (36.9 C), temperature source Oral, resp. rate 16, height 6' (1.829 m), weight 220 lb (99.8 kg), SpO2 100 %. Physical Exam  Constitutional: He appears well-developed.  HENT:  Head: Normocephalic.  Eyes: Pupils are equal, round, and reactive to light.  Neck: Normal range of motion.  Cardiovascular: Normal rate.   Respiratory: Effort normal.  Neurological: He is alert.  Skin: Skin is warm.  Psychiatric: He has a normal mood and affect.   Examination the left knee demonstrates reasonable range of motion with nearly full extension lacking only less than 5 as well as flexion to about 105.  Collaterals feel stable but there is increased posterior lateral rotational instability on the left compared to the right by about 15.  Anterior cruciate ligament is out the PCL is intact on the left-hand side.  Medial side also is intact.  Trace effusion is present.  Ankle  dorsiflexion plantar flexion intact with intact motor sensation and sensory function in the left foot.  No groin pain with internal/external rotation of the leg  Assessment/Plan Impression is left knee pain and instability with anterior cruciate ligament tear and lateral meniscal tear which may or may not be repairable as well as increased external rotation at 15 to 30 of flexion on the left compared to the  right.  Plan is arthroscopy with autograft anterior cruciate ligament reconstruction meniscal debridement versus repair and possible fibular based posterior lateral corner reconstruction using Karl Flores's.  Risks and benefits are discussed.  All questions answered.  Risks including not limited to infection or vessel damage knee stiffness as well as a period of nonweightbearing all discussed.  Patient understands and wishes to proceed.  Burnard Bunting, MD 12/04/2016, 12:10 PM

## 2016-12-04 NOTE — Transfer of Care (Signed)
Immediate Anesthesia Transfer of Care Note  Patient: Karl Flores  Procedure(s) Performed: Procedure(s): KNEE ARTHROSCOPY WITH ANTERIOR CRUCIATE LIGAMENT (ACL) REPAIR WITH HAMSTRING GRAFT WITH POSSIBLE POSTROLATERAL CORNER RECONSTRUCTION AND LATERAL MENISCAL REPAIR VERSUS RESECTION (Left) KNEE ARTHROSCOPY WITH MENISCAL REPAIR (Left)  Patient Location: PACU  Anesthesia Type:General, Regional and GA combined with regional for post-op pain  Level of Consciousness: awake, oriented, sedated, patient cooperative and responds to stimulation  Airway & Oxygen Therapy: Patient Spontanous Breathing and Patient connected to nasal cannula oxygen  Post-op Assessment: Report given to RN, Post -op Vital signs reviewed and stable, Patient moving all extremities and Patient moving all extremities X 4  Post vital signs: Reviewed and stable  Last Vitals:  Vitals:   12/04/16 0743  BP: (!) 141/90  Pulse: 73  Resp: 16  Temp: 36.9 C    Last Pain:  Vitals:   12/04/16 0743  TempSrc: Oral      Patients Stated Pain Goal: 5 (12/04/16 0743)  Complications: No apparent anesthesia complications

## 2016-12-04 NOTE — Anesthesia Preprocedure Evaluation (Signed)
Anesthesia Evaluation  Patient identified by MRN, date of birth, ID band Patient awake    Reviewed: Allergy & Precautions, H&P , NPO status , Patient's Chart, lab work & pertinent test results  Airway Mallampati: I  TM Distance: >3 FB Neck ROM: Full    Dental no notable dental hx. (+) Teeth Intact, Dental Advisory Given   Pulmonary neg pulmonary ROS,    Pulmonary exam normal breath sounds clear to auscultation       Cardiovascular negative cardio ROS   Rhythm:Regular Rate:Normal     Neuro/Psych negative neurological ROS  negative psych ROS   GI/Hepatic negative GI ROS, Neg liver ROS, Controlled,  Endo/Other  negative endocrine ROS  Renal/GU negative Renal ROS  negative genitourinary   Musculoskeletal   Abdominal   Peds  Hematology negative hematology ROS (+)   Anesthesia Other Findings   Reproductive/Obstetrics negative OB ROS                             Anesthesia Physical Anesthesia Plan  ASA: I  Anesthesia Plan: General   Post-op Pain Management:  Regional for Post-op pain   Induction: Intravenous  PONV Risk Score and Plan: 3 and Ondansetron, Dexamethasone, Propofol and Midazolam  Airway Management Planned: Oral ETT  Additional Equipment:   Intra-op Plan:   Post-operative Plan: Extubation in OR  Informed Consent: I have reviewed the patients History and Physical, chart, labs and discussed the procedure including the risks, benefits and alternatives for the proposed anesthesia with the patient or authorized representative who has indicated his/her understanding and acceptance.   Dental advisory given  Plan Discussed with: CRNA  Anesthesia Plan Comments:         Anesthesia Quick Evaluation

## 2016-12-04 NOTE — Brief Op Note (Signed)
12/04/2016  4:19 PM  PATIENT:  Marcille BlancoKhaled H Fergeson  21 y.o. male  PRE-OPERATIVE DIAGNOSIS:  LEFT KNEE ANTERIOR CRUICATE LIGAMENT TEAR, LATERAL MENISCUS TEAR, POSTEROLATERAL CORNER INJURY  POST-OPERATIVE DIAGNOSIS:  LEFT KNEE ANTERIOR CRUICATE LIGAMENT TEAR, LATERAL MENISCUS TEAR, POSTEROLATERAL CORNER INJURY  PROCEDURE:  Procedure(s): KNEE ARTHROSCOPY WITH ANTERIOR CRUCIATE LIGAMENT (ACL)  reconstruction WITH HAMSTRING GRAFT WITH  POSTROLATERAL CORNER RECONSTRUCTION AND LATERAL MENISCAL   RESECTION KNEE ARTHROSCOPY WITH MENISCAL REPAIR  SURGEON:  Surgeon(s): Cammy Copaean, Scott Britlee Skolnik, MD  ASSISTANT: April Green RNFA  ANESTHESIA:   general  EBL: 25 ml    Total I/O In: 2000 [I.V.:2000] Out: 5 [Blood:5]  BLOOD ADMINISTERED: none  DRAINS: none   LOCAL MEDICATIONS USED:  Marcaine morphine clonidine  SPECIMEN:  No Specimen  COUNTS:  YES  TOURNIQUET:   Total Tourniquet Time Documented: Thigh (Left) - 53 minutes Total: Thigh (Left) - 53 minutes   DICTATION: .Other Dictation: Dictation Number [id number] PLAN OF CARE: Discharge to home after PACU  PATIENT DISPOSITION:  PACU - hemodynamically stable

## 2016-12-04 NOTE — Anesthesia Postprocedure Evaluation (Signed)
Anesthesia Post Note  Patient: Karl Flores  Procedure(s) Performed: Procedure(s) (LRB): KNEE ARTHROSCOPY WITH ANTERIOR CRUCIATE LIGAMENT (ACL) REPAIR WITH HAMSTRING GRAFT WITH POSSIBLE POSTROLATERAL CORNER RECONSTRUCTION AND LATERAL MENISCAL REPAIR VERSUS RESECTION (Left) KNEE ARTHROSCOPY WITH MENISCAL REPAIR (Left)     Patient location during evaluation: PACU Anesthesia Type: General Level of consciousness: awake and alert Pain management: pain level controlled Vital Signs Assessment: post-procedure vital signs reviewed and stable Respiratory status: spontaneous breathing, nonlabored ventilation and respiratory function stable Cardiovascular status: blood pressure returned to baseline and stable Postop Assessment: no signs of nausea or vomiting Anesthetic complications: no    Last Vitals:  Vitals:   12/04/16 1745 12/04/16 1755  BP: (!) 153/71 (!) 152/86  Pulse: 74   Resp: (!) 25   Temp:  36.4 C    Last Pain:  Vitals:   12/04/16 1755  TempSrc:   PainSc: 6                  Natallie Ravenscroft,W. EDMOND

## 2016-12-04 NOTE — Anesthesia Procedure Notes (Signed)
Procedure Name: Intubation Date/Time: 12/04/2016 12:49 PM Performed by: Army FossaPULLIAM, Eleora Sutherland DANE Pre-anesthesia Checklist: Patient identified, Emergency Drugs available, Suction available and Patient being monitored Patient Re-evaluated:Patient Re-evaluated prior to inductionOxygen Delivery Method: Circle System Utilized Preoxygenation: Pre-oxygenation with 100% oxygen Intubation Type: IV induction Ventilation: Mask ventilation without difficulty Laryngoscope Size: Miller and 2 Grade View: Grade I Tube type: Oral Tube size: 7.5 mm Number of attempts: 1 Airway Equipment and Method: Stylet and Oral airway Placement Confirmation: ETT inserted through vocal cords under direct vision,  positive ETCO2 and breath sounds checked- equal and bilateral Secured at: 23 cm Tube secured with: Tape Dental Injury: Teeth and Oropharynx as per pre-operative assessment

## 2016-12-05 ENCOUNTER — Encounter (HOSPITAL_COMMUNITY): Payer: Self-pay | Admitting: Orthopedic Surgery

## 2016-12-05 ENCOUNTER — Telehealth (INDEPENDENT_AMBULATORY_CARE_PROVIDER_SITE_OTHER): Payer: Self-pay | Admitting: Radiology

## 2016-12-05 NOTE — Op Note (Signed)
NAME:  Karl Flores, Karl Flores                       ACCOUNT NO.:  MEDICAL RECORD NO.:  [id number]  LOCATION:                                 FACILITY:  PHYSICIAN:  Burnard Bunting, M.D.    DATE OF BIRTH:  [birth year]  DATE OF PROCEDURE: DATE OF DISCHARGE:                              OPERATIVE REPORT   PREOPERATIVE DIAGNOSES:  Left knee ACL tear, lateral meniscal tear, posterolateral corner injury.  POSTOPERATIVE DIAGNOSES:  Left knee ACL tear, lateral meniscal tear, posterolateral corner injury.  PROCEDURE:  Left knee ACL reconstruction, hamstring autograft 8 mm with partial lateral meniscectomy and fibular-based popliteofibular ligament reconstruction with gracilis autograft.  SURGEON:  Burnard Bunting, M.D.  ASSISTANT:  Karl Chilton Si, RNFA.  INDICATIONS:  Karl Flores is a 21 year old patient with left knee pain and instability, who presents for operative management after explanation of risks and benefits.  PROCEDURE IN DETAIL:  The patient was brought to the operating room where a general anesthetic was induced.  Preoperative antibiotics administered.  Time-out was called.  Left leg was examined under anesthesia, found to have full extension, full flexion, trace effusion, PCL intact, medial and lateral collateral ligaments stable to varus and valgus stress at 0 and 30 degrees.  The patient did have increased external rotation about 15 degrees at 30 degrees of flexion, left versus right.  This was not maintained as a side-to-side difference when the knee was flexed to 90 degrees.  ACL was out on the left, intact on the right.  Following examination under anesthesia, the leg was prescrubbed with alcohol and Betadine, allowed to air dry, prepped with DuraPrep solution, and draped in sterile manner.  Time-out was called. Semitendinosus graft was harvested with care being taken to avoid injury to the saphenous nerve.  Semitendinosus was then harvested and prepared on the back table by Karl Flores  using dual Endobutton, Arthrex technique to an 8-mm graft.  Concurrently, the harvest incision was irrigated, and then, the anterior inferolateral, anterior inferomedial portals were established using diagnostic arthroscopy.  Portals were created.  Diagnostic arthroscopy demonstrated intact medial compartment, articular cartilage, and meniscus and intact patellofemoral compartment. Intact lateral compartment, but with tear of the posterior horn lateral meniscus involving about 40% anterior-posterior width of the meniscus. The ACL was also torn.  PCL was intact.  Following arthroscopic assessment, the meniscal tear was debrided back to a stable rim.  There was nothing repairable about the meniscus.  The lateral meniscus was otherwise stable.  ACL stump debrided.  Notchplasty performed. Posterior back wall identified.  Guide was then placed, and an 8-mm FlipCutter was then utilized to drill a tunnel in the 3 o'clock position.  This created an excellent tunnel.  Tibial tunnel was then created in the posterior aspect of the native ACL footprint.  Graft was then passed, StimuBlast placed into each tunnel.  The graft was secured in full extension with good isometry noted.  At this time, repeat evaluation of the posterolateral corner was made, and there was still a side-to-side difference, left versus right by about 10 to 15 degrees.  I elected at this time to proceed with autograft popliteal  fibular ligament reconstruction because there was no varus recurvatum deformity. There was also no instability to varus stress at 0 and 30 degrees, but there was rotational instability.  Lateral approach to the knee was made.  Skin and subcutaneous tissue were sharply divided.  The fibular head was palpated.  The peroneal nerve was then identified and dissected free and protected at all times during the case.  The tissue window above the hamstring attachment was opened.  In addition, an incision was made  longitudinally through the iliotibial band into the capsule.  The popliteal hiatus was visualized.  This was drilled to 20 mm with a 5-mm drill bit.  Concurrently, the gracilis graft was harvested from the donor site on the left knee and prepared on the back table.  The tunnel was then created from the popliteal hiatus down to the posterior aspect of the fibular head.  With the peroneal nerve all protected, the tunnel was drilled from anterolateral to posteromedial.  The graft was then passed and secured with the knee in internal rotation and 30 degrees of flexion, and this was secured with a SwiveLock interference screw, 4.75 mm.  At this time, the tourniquet was released.  Thorough irrigation was performed.  The arthrotomy proximally over the popliteal hiatus was closed using #1 Vicryl suture.  The window of tissue between the inferior aspect of the iliotibial band and the hamstring tendon was also closed.  About 2 cm of the hamstring tendon from the anterior aspect of the fibular head was sutured into surrounding soft tissue for added backup fixation.  The incision was then closed using 0 Vicryl suture, 2- 0 Vicryl suture, and 3-0 Monocryl.  The knee joint thoroughly irrigated, and the portals were closed using 2-0 Vicryl and 3-0 nylon and then, the harvest incision was closed using 0 Vicryl suture, 2-0 Vicryl suture, and 3-0 Monocryl along with Steri-Strips.  Impervious dressing was placed.  The patient then placed into a knee immobilizer.  Bulky dressing applied.  He tolerated the procedure well without immediate complication.     Burnard BuntingG. Scott Dean, M.D.     GSD/MEDQ  D:  12/04/2016  T:  12/04/2016  Job:  [id number]

## 2016-12-05 NOTE — Telephone Encounter (Signed)
Patient calling today complaining of severe pain. Patient status post Knee scope ACL repair. He is taking oxycodone 1-2 1 po q 6 hours, robaxin and aspirin. He states this is not touching his pain at all, he is wanting percocet, I explained to him this is the same as oxycodone, he states that the percocet is what helps him not the oxycodone.. Can you please advise at [phone].

## 2016-12-05 NOTE — Telephone Encounter (Signed)
IC s/w patient and he stated he was doing ok now and did not need anything at this time.

## 2016-12-12 ENCOUNTER — Encounter (INDEPENDENT_AMBULATORY_CARE_PROVIDER_SITE_OTHER): Payer: Self-pay | Admitting: Orthopedic Surgery

## 2016-12-12 ENCOUNTER — Ambulatory Visit (INDEPENDENT_AMBULATORY_CARE_PROVIDER_SITE_OTHER): Payer: Self-pay | Admitting: Orthopedic Surgery

## 2016-12-12 DIAGNOSIS — Z9889 Other specified postprocedural states: Secondary | ICD-10-CM

## 2016-12-13 ENCOUNTER — Encounter: Payer: Self-pay | Admitting: Physical Therapy

## 2016-12-13 ENCOUNTER — Ambulatory Visit: Payer: Self-pay | Attending: Orthopedic Surgery | Admitting: Physical Therapy

## 2016-12-13 DIAGNOSIS — M25562 Pain in left knee: Secondary | ICD-10-CM | POA: Insufficient documentation

## 2016-12-13 DIAGNOSIS — R262 Difficulty in walking, not elsewhere classified: Secondary | ICD-10-CM | POA: Insufficient documentation

## 2016-12-13 DIAGNOSIS — R2242 Localized swelling, mass and lump, left lower limb: Secondary | ICD-10-CM | POA: Insufficient documentation

## 2016-12-13 DIAGNOSIS — M25662 Stiffness of left knee, not elsewhere classified: Secondary | ICD-10-CM | POA: Insufficient documentation

## 2016-12-13 NOTE — Progress Notes (Signed)
   Post-Op Visit Note   Patient: Karl Flores           Date of Birth: [birth year]           MRN: [id number] Visit Date: 12/12/2016 PCP: Marcine MatarJohnson, Deborah B, MD   Assessment & Plan:  Chief Complaint:  Chief Complaint  Patient presents with  . Left Knee - Routine Post Op   Visit Diagnoses:  1. S/P ACL repair     Plan: To lead is a patient who is now a week out left knee anterior cruciate ligament reconstruction with hamstring autograft along with fibular based posterior lateral corner reconstruction.  He's been doing well.  Taking oxycodone for pain.  He is on aspirin daily for DVT prophylaxis.  Sutures removed.  Incisions intact.  He is at 3369 on CPM machine.  [facility]  Okay for knee range of motion as well as partial weightbearing 50% in the knee immobilizer.  Related to that 3 times a week for 6 weeks.  In general his knee looks very good at this time.  He does have full extension.  Follow-up in 3 weeks for clinical recheck and advance to full weightbearing.  Follow-Up Instructions: Return in about 3 weeks (around 01/02/2017).   Orders:  Orders Placed This Encounter  Procedures  . Ambulatory referral to Physical Therapy   No orders of the defined types were placed in this encounter.   Imaging: No results found.  PMFS History: There are no active problems to display for this patient.  Past Medical History:  Diagnosis Date  . GERD (gastroesophageal reflux disease)    occ    No family history on file.  Past Surgical History:  Procedure Laterality Date  . KNEE ARTHROSCOPY WITH ANTERIOR CRUCIATE LIGAMENT (ACL) REPAIR WITH HAMSTRING GRAFT Left 12/04/2016   Procedure: KNEE ARTHROSCOPY WITH ANTERIOR CRUCIATE LIGAMENT (ACL) REPAIR WITH HAMSTRING GRAFT WITH POSSIBLE POSTROLATERAL CORNER RECONSTRUCTION AND LATERAL MENISCAL REPAIR VERSUS RESECTION;  Surgeon: Cammy Copaean, Scott Itxel Wickard, MD;  Location: [facility] OR;  Service: Orthopedics;  Laterality: Left;  . KNEE  ARTHROSCOPY WITH MENISCAL REPAIR Left 12/04/2016   Procedure: KNEE ARTHROSCOPY WITH MENISCAL REPAIR;  Surgeon: Cammy Copaean, Scott Jozee Hammer, MD;  [facility];  Service: Orthopedics;  Laterality: Left;  . NO PAST SURGERIES     Social History   Occupational History  . Not on file.   Social History Main Topics  . Smoking status: Never Smoker  . Smokeless tobacco: Never Used     Comment: hookah once a week  . Alcohol use No  . Drug use: No  . Sexual activity: No

## 2016-12-13 NOTE — Therapy (Signed)
[facility] [place] [address] [place], [place], [address] Phone: [phone]   Fax:  [phone]  Physical Therapy Evaluation  Patient Details  Name: Karl Flores MRN: [id number] Date of Birth: 01-01-1996 Referring Provider: August Saucer  Encounter Date: 12/13/2016      PT End of Session - 12/13/16 1142    Visit Number 1   Date for PT Re-Evaluation 02/12/17   PT Start Time 1022   PT Stop Time 1120   PT Time Calculation (min) 58 min   Activity Tolerance Patient limited by pain   Behavior During Therapy [facility] for tasks assessed/performed      Past Medical History:  Diagnosis Date  . GERD (gastroesophageal reflux disease)    occ    Past Surgical History:  Procedure Laterality Date  . KNEE ARTHROSCOPY WITH ANTERIOR CRUCIATE LIGAMENT (ACL) REPAIR WITH HAMSTRING GRAFT Left 12/04/2016   Procedure: KNEE ARTHROSCOPY WITH ANTERIOR CRUCIATE LIGAMENT (ACL) REPAIR WITH HAMSTRING GRAFT WITH POSSIBLE POSTROLATERAL CORNER RECONSTRUCTION AND LATERAL MENISCAL REPAIR VERSUS RESECTION;  Surgeon: Cammy Copa, MD;  [facility] OR;  Service: Orthopedics;  Laterality: Left;  . KNEE ARTHROSCOPY WITH MENISCAL REPAIR Left 12/04/2016   Procedure: KNEE ARTHROSCOPY WITH MENISCAL REPAIR;  Surgeon: Cammy Copa, MD;  Location: [facility] OR;  Service: Orthopedics;  Laterality: Left;  . NO PAST SURGERIES      There were no vitals filed for this visit.       Subjective Assessment - 12/13/16 1025    Subjective Patient reports that he hurt his left knee playing basketball about 5 weeks ago.  He underwent a left ACL repair with HS graft, reports a meniscus repair as well as a posteriolateral compartment repair.  Reports that he has been in a lot of pain, but saw the MD yesterday and the DR was pleased with the amount of work that they did.   Limitations Standing;Walking;House hold activities   Patient Stated Goals walk and have less pain   Currently  in Pain? Yes   Pain Score 2    Pain Location Knee   Pain Orientation Left   Pain Descriptors / Indicators Aching;Throbbing   Pain Type Surgical pain   Pain Onset In the past 7 days   Pain Frequency Constant   Aggravating Factors  standing, bending pain up to 8/10   Pain Relieving Factors ice and rest   Effect of Pain on Daily Activities difficulty iwth all activtiy            [facility] PT Assessment - 12/13/16 0001      Assessment   Medical Diagnosis s/p left ACL repair with meniscus repair    Referring Provider Dean   Onset Date/Surgical Date 12/04/16   Prior Therapy no     Precautions   Precautions None     Balance Screen   Has the patient fallen in the past 6 months No   Has the patient had a decrease in activity level because of a fear of falling?  No   Is the patient reluctant to leave their home because of a fear of falling?  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   Leisure played basketball, some gym stuff daily     Observation/Other Assessments-Edema    Edema  Circumferential     Circumferential Edema   Circumferential - Right 42.5cm mid patella   Circumferential - Left  46cm     ROM / Strength   AROM / PROM / Strength PROM;AROM;Strength     AROM   Overall AROM Comments very weak quads, difficulty with quad activation   AROM Assessment Site Knee   Right/Left Knee Left   Left Knee Extension 26   Left Knee Flexion 60     PROM   Overall PROM Comments very painful with flexion   PROM Assessment Site Knee   Right/Left Knee Left   Left Knee Extension 10   Left Knee Flexion 80     Strength   Overall Strength Comments not tested due to protocol     Palpation   Palpation comment scars are healing well, he is tender to these, swelling behind the patella     Ambulation/Gait   Gait Comments  using two crutches, he comes in non weightbearing, slow            Objective measurements completed on examination: See above findings.          [facility] Adult PT Treatment/Exercise - 12/13/16 0001      Modalities   Modalities Vasopneumatic     Vasopneumatic   Number Minutes Vasopneumatic  15 minutes   Vasopnuematic Location  Knee   Vasopneumatic Pressure Medium   Vasopneumatic Temperature  34                PT Education - 12/13/16 1141    Education provided Yes   Education Details went over the 50% weightbearing and gait with crutches, demonstrated on a scale of 75#, also started QS and SAQ for quad activation   Person(s) Educated Patient   Methods Demonstration;Explanation;Handout;Verbal cues   Comprehension Verbalized understanding;Returned demonstration          PT Short Term Goals - 12/13/16 1148      PT SHORT TERM GOAL #1   Title independent with initial HEP   Time 2   Period Weeks   Status New           PT Long Term Goals - 12/13/16 1148      PT LONG TERM GOAL #1   Title decrease pain 50%   Time 12   Period Weeks   Status New     PT LONG TERM GOAL #2   Title increase AROM of the left knee to 0-120 degrees flexion   Time 12   Period Weeks   Status New     PT LONG TERM GOAL #3   Title walk all distances without device with minimal deviation   Time 12   Period Weeks   Status New     PT LONG TERM GOAL #4   Title increase strength of the left knee to 4+/5   Time 12   Period Weeks   Status New     PT LONG TERM GOAL #5   Title go up and down stairs step over step   Time 12   Period Weeks   Status New                Plan - 12/13/16 1143    Clinical Impression Statement Patient injured his left knee while playing basketball about 5 weeks ago, he underwent a left knee ACL reconstruction with HS graft and a posteriolateral compartment reconstruction.  He is to be 50% weight bearing if he tolerates it.  He  had ROM of  23-65 degrees flexion.  Swelling around the knee.  Very poor quad activation   Clinical Presentation Evolving   Clinical Decision Making Low   Rehab Potential Good   PT Frequency 3x / week   PT Duration 12 weeks   PT Treatment/Interventions ADLs/Self Care Home Management;Cryotherapy;Electrical Stimulation;Gait training;Stair training;Functional mobility training;Patient/family education;Neuromuscular re-education;Balance training;Therapeutic exercise;Therapeutic activities;Manual techniques;Vasopneumatic Device   PT Next Visit Plan Follow ACL protocol however he is 50% WBing with the brace on as he had a posteriolaterel compartment reconstruction as well   Consulted and Agree with Plan of Care Patient      Patient will benefit from skilled therapeutic intervention in order to improve the following deficits and impairments:  Abnormal gait, Decreased activity tolerance, Decreased balance, Decreased mobility, Decreased strength, Increased edema, Pain, Decreased endurance, Decreased range of motion, Difficulty walking  Visit Diagnosis: Acute pain of left knee - Plan: PT plan of care cert/re-cert  Stiffness of left knee, not elsewhere classified - Plan: PT plan of care cert/re-cert  Localized swelling, mass and lump, left lower limb - Plan: PT plan of care cert/re-cert  Difficulty in walking, not elsewhere classified - Plan: PT plan of care cert/re-cert     Problem List There are no active problems to display for this patient.   Jearld Lesch., PT 12/13/2016, 11:56 AM  [facility]- [address] [place] [address] [place], [place], [address] Phone: [phone]   Fax:  [phone]  Name: Karl Flores MRN: [id number] Date of Birth: 15-Feb-1996

## 2016-12-17 ENCOUNTER — Encounter: Payer: Self-pay | Admitting: Physical Therapy

## 2016-12-17 ENCOUNTER — Ambulatory Visit: Payer: Self-pay | Attending: Orthopedic Surgery | Admitting: Physical Therapy

## 2016-12-17 DIAGNOSIS — R2242 Localized swelling, mass and lump, left lower limb: Secondary | ICD-10-CM | POA: Insufficient documentation

## 2016-12-17 DIAGNOSIS — M25562 Pain in left knee: Secondary | ICD-10-CM | POA: Insufficient documentation

## 2016-12-17 DIAGNOSIS — R262 Difficulty in walking, not elsewhere classified: Secondary | ICD-10-CM | POA: Insufficient documentation

## 2016-12-17 DIAGNOSIS — M25662 Stiffness of left knee, not elsewhere classified: Secondary | ICD-10-CM | POA: Insufficient documentation

## 2016-12-17 NOTE — Therapy (Signed)
[facility] [place] [address] [place], [place], [address] Phone: [phone]   Fax:  [phone]  Physical Therapy Treatment  Patient Details  Name: Karl Flores MRN: [id number] Date of Birth: 12/06/1995 Referring Provider: August Saucer  Encounter Date: 12/17/2016      PT End of Session - 12/17/16 1011    Visit Number 2   Date for PT Re-Evaluation 02/12/17   PT Start Time 0932   PT Stop Time 1025   PT Time Calculation (min) 53 min   Activity Tolerance Patient limited by pain;Patient tolerated treatment well   Behavior During Therapy [facility] for tasks assessed/performed      Past Medical History:  Diagnosis Date  . GERD (gastroesophageal reflux disease)    occ    Past Surgical History:  Procedure Laterality Date  . KNEE ARTHROSCOPY WITH ANTERIOR CRUCIATE LIGAMENT (ACL) REPAIR WITH HAMSTRING GRAFT Left 12/04/2016   Procedure: KNEE ARTHROSCOPY WITH ANTERIOR CRUCIATE LIGAMENT (ACL) REPAIR WITH HAMSTRING GRAFT WITH POSSIBLE POSTROLATERAL CORNER RECONSTRUCTION AND LATERAL MENISCAL REPAIR VERSUS RESECTION;  Surgeon: Cammy Copa, MD;  Location: [facility] OR;  Service: Orthopedics;  Laterality: Left;  . KNEE ARTHROSCOPY WITH MENISCAL REPAIR Left 12/04/2016   Procedure: KNEE ARTHROSCOPY WITH MENISCAL REPAIR;  Surgeon: Cammy Copa, MD;  Location: [facility] OR;  Service: Orthopedics;  Laterality: Left;  . NO PAST SURGERIES      There were no vitals filed for this visit.      Subjective Assessment - 12/17/16 0939    Subjective Pt reports compliance with HEP and that he is all right   Currently in Pain? Yes   Pain Score 4    Pain Location Knee   Pain Orientation Left                         [facility] Adult PT Treatment/Exercise - 12/17/16 0001      Exercises   Exercises Knee/Hip     Knee/Hip Exercises: Supine   Quad Sets 2 sets;Left;10 reps   Short Arc Quad Sets Left;2 sets;10 reps   Heel Slides 2 sets;15  reps;Left;AROM     Modalities   Modalities Vasopneumatic     Vasopneumatic   Number Minutes Vasopneumatic  15 minutes   Vasopnuematic Location  Knee   Vasopneumatic Pressure Medium   Vasopneumatic Temperature  34     Manual Therapy   Manual Therapy Passive ROM   Passive ROM L knee flex and ext                  PT Short Term Goals - 12/13/16 1148      PT SHORT TERM GOAL #1   Title independent with initial HEP   Time 2   Period Weeks   Status New           PT Long Term Goals - 12/13/16 1148      PT LONG TERM GOAL #1   Title decrease pain 50%   Time 12   Period Weeks   Status New     PT LONG TERM GOAL #2   Title increase AROM of the left knee to 0-120 degrees flexion   Time 12   Period Weeks   Status New     PT LONG TERM GOAL #3   Title walk all distances without device with minimal deviation   Time 12   Period Weeks   Status New     PT  LONG TERM GOAL #4   Title increase strength of the left knee to 4+/5   Time 12   Period Weeks   Status New     PT LONG TERM GOAL #5   Title go up and down stairs step over step   Time 12   Period Weeks   Status New               Plan - 12/17/16 1012    Clinical Impression Statement Pt demos good quad contraction with quad sets and SAQ but does reports pain. Sliding board and sheet used during heel slides. Does reports some pain with MT at end range of flexion.   Rehab Potential Good   PT Frequency 3x / week   PT Duration 12 weeks   PT Treatment/Interventions ADLs/Self Care Home Management;Cryotherapy;Electrical Stimulation;Gait training;Stair training;Functional mobility training;Patient/family education;Neuromuscular re-education;Balance training;Therapeutic exercise;Therapeutic activities;Manual techniques;Vasopneumatic Device   PT Next Visit Plan Follow ACL protocol however he is 50% WBing with the brace on as he had a posteriolaterel compartment reconstruction as well      Patient will benefit  from skilled therapeutic intervention in order to improve the following deficits and impairments:  Abnormal gait, Decreased activity tolerance, Decreased balance, Decreased mobility, Decreased strength, Increased edema, Pain, Decreased endurance, Decreased range of motion, Difficulty walking  Visit Diagnosis: Acute pain of left knee  Stiffness of left knee, not elsewhere classified  Localized swelling, mass and lump, left lower limb  Difficulty in walking, not elsewhere classified     Problem List There are no active problems to display for this patient.   Grayce Sessionsonald G Jeyden Coffelt, PTA 12/17/2016, 10:14 AM  [facility] [place] [place] [address] [place], [place], [address] Phone: [phone]   Fax:  [phone]  Name: Karl Flores MRN: [id number] Date of Birth: 01-14-1996

## 2016-12-20 ENCOUNTER — Encounter: Payer: Self-pay | Admitting: Physical Therapy

## 2016-12-20 ENCOUNTER — Ambulatory Visit: Payer: Self-pay | Admitting: Physical Therapy

## 2016-12-20 DIAGNOSIS — M25662 Stiffness of left knee, not elsewhere classified: Secondary | ICD-10-CM

## 2016-12-20 DIAGNOSIS — R262 Difficulty in walking, not elsewhere classified: Secondary | ICD-10-CM

## 2016-12-20 DIAGNOSIS — M25562 Pain in left knee: Secondary | ICD-10-CM

## 2016-12-20 DIAGNOSIS — R2242 Localized swelling, mass and lump, left lower limb: Secondary | ICD-10-CM

## 2016-12-20 NOTE — Therapy (Signed)
[facility] [place] [address] [place], [place], [address] Phone: [phone]   Fax:  [phone]  Physical Therapy Treatment  Patient Details  Name: Karl Flores MRN: [id number] Date of Birth: 10-Feb-1996 Referring Provider: August Saucer  Encounter Date: 12/20/2016      PT End of Session - 12/20/16 1226    Visit Number 3   Date for PT Re-Evaluation 02/12/17   PT Start Time 1145   PT Stop Time 1239   PT Time Calculation (min) 54 min   Activity Tolerance Patient tolerated treatment well   Behavior During Therapy [facility] for tasks assessed/performed      Past Medical History:  Diagnosis Date  . GERD (gastroesophageal reflux disease)    occ    Past Surgical History:  Procedure Laterality Date  . KNEE ARTHROSCOPY WITH ANTERIOR CRUCIATE LIGAMENT (ACL) REPAIR WITH HAMSTRING GRAFT Left 12/04/2016   Procedure: KNEE ARTHROSCOPY WITH ANTERIOR CRUCIATE LIGAMENT (ACL) REPAIR WITH HAMSTRING GRAFT WITH POSSIBLE POSTROLATERAL CORNER RECONSTRUCTION AND LATERAL MENISCAL REPAIR VERSUS RESECTION;  Surgeon: Cammy Copa, MD;  Location: [facility] OR;  Service: Orthopedics;  Laterality: Left;  . KNEE ARTHROSCOPY WITH MENISCAL REPAIR Left 12/04/2016   Procedure: KNEE ARTHROSCOPY WITH MENISCAL REPAIR;  Surgeon: Cammy Copa, MD;  Location: [facility] OR;  Service: Orthopedics;  Laterality: Left;  . NO PAST SURGERIES      There were no vitals filed for this visit.      Subjective Assessment - 12/20/16 1145    Subjective "Felt good after last time" "Walking is easier with the crutches"   Currently in Pain? No/denies   Pain Score 0-No pain            [facility] PT Assessment - 12/20/16 0001      AROM   AROM Assessment Site Knee   Right/Left Knee Left   Left Knee Flexion 101  Supine with pillow case and sliding board                     [facility] Adult PT Treatment/Exercise - 12/20/16 0001      Ambulation/Gait   Ambulation/Gait Yes   Ambulation/Gait Assistance 6: Modified independent (Device/Increase time)   Ambulation Distance (Feet) 200 Feet   Assistive device R Axillary Crutch;L Axillary Crutch   Gait Pattern Step-through pattern   Ambulation Surface Level;Indoor     Knee/Hip Exercises: Aerobic   Nustep L5 x 6 min      Knee/Hip Exercises: Supine   Quad Sets 2 sets;Left;10 reps   Short Arc Quad Sets Left;2 sets;10 reps   Heel Slides 2 sets;Left;AROM;10 reps   Straight Leg Raises Left;2 sets;10 reps     Knee/Hip Exercises: Sidelying   Hip ABduction AROM;Strengthening;Left;2 sets;10 reps   Hip ADduction Left;2 sets;5 reps     Modalities   Modalities Vasopneumatic     Vasopneumatic   Number Minutes Vasopneumatic  15 minutes   Vasopnuematic Location  Knee   Vasopneumatic Pressure Medium   Vasopneumatic Temperature  34     Manual Therapy   Manual Therapy Passive ROM   Passive ROM L knee flex and ext                  PT Short Term Goals - 12/13/16 1148      PT SHORT TERM GOAL #1   Title independent with initial HEP   Time 2   Period Weeks   Status New  PT Long Term Goals - 12/13/16 1148      PT LONG TERM GOAL #1   Title decrease pain 50%   Time 12   Period Weeks   Status New     PT LONG TERM GOAL #2   Title increase AROM of the left knee to 0-120 degrees flexion   Time 12   Period Weeks   Status New     PT LONG TERM GOAL #3   Title walk all distances without device with minimal deviation   Time 12   Period Weeks   Status New     PT LONG TERM GOAL #4   Title increase strength of the left knee to 4+/5   Time 12   Period Weeks   Status New     PT LONG TERM GOAL #5   Title go up and down stairs step over step   Time 12   Period Weeks   Status New               Plan - 12/20/16 1227    Clinical Impression Statement All supine interventions performed well. Pt often times amb swinging LLE and avoid weightbearing. Informed pt on the importance on  bearing some weight in LLE.    Rehab Potential Good   PT Frequency 3x / week   PT Duration 12 weeks   PT Treatment/Interventions ADLs/Self Care Home Management;Cryotherapy;Electrical Stimulation;Gait training;Stair training;Functional mobility training;Patient/family education;Neuromuscular re-education;Balance training;Therapeutic exercise;Therapeutic activities;Manual techniques;Vasopneumatic Device   PT Next Visit Plan Follow ACL protocol however he is 50% WBing with the brace on as he had a posteriolaterel compartment reconstruction as well      Patient will benefit from skilled therapeutic intervention in order to improve the following deficits and impairments:  Abnormal gait, Decreased activity tolerance, Decreased balance, Decreased mobility, Decreased strength, Increased edema, Pain, Decreased endurance, Decreased range of motion, Difficulty walking  Visit Diagnosis: Acute pain of left knee  Stiffness of left knee, not elsewhere classified  Localized swelling, mass and lump, left lower limb  Difficulty in walking, not elsewhere classified     Problem List There are no active problems to display for this patient.   Grayce Sessionsonald G Pemberton, PTA 12/20/2016, 12:31 PM  [facility] [place] [address] [place], [place], [address] Phone: [phone]   Fax:  [phone]  Name: Marcille BlancoKhaled H Heroux MRN: [id number] Date of Birth: Jul 21, 1995

## 2016-12-21 ENCOUNTER — Ambulatory Visit: Payer: Self-pay | Admitting: Physical Therapy

## 2016-12-24 ENCOUNTER — Ambulatory Visit: Payer: Self-pay | Admitting: Physical Therapy

## 2016-12-24 ENCOUNTER — Encounter: Payer: Self-pay | Admitting: Physical Therapy

## 2016-12-24 DIAGNOSIS — M25662 Stiffness of left knee, not elsewhere classified: Secondary | ICD-10-CM

## 2016-12-24 DIAGNOSIS — R2242 Localized swelling, mass and lump, left lower limb: Secondary | ICD-10-CM

## 2016-12-24 DIAGNOSIS — R262 Difficulty in walking, not elsewhere classified: Secondary | ICD-10-CM

## 2016-12-24 DIAGNOSIS — M25562 Pain in left knee: Secondary | ICD-10-CM

## 2016-12-24 NOTE — Therapy (Signed)
[facility] [place] [address] [place], [place], [address] Phone: [phone]   Fax:  [phone]  Physical Therapy Treatment  Patient Details  Name: Karl Flores MRN: [id number] Date of Birth: [birth year] Referring Provider: August Saucer  Encounter Date: 12/24/2016      PT End of Session - 12/24/16 1126    Visit Number 4   Date for PT Re-Evaluation 02/12/17   PT Start Time 1056   PT Stop Time 1153   PT Time Calculation (min) 57 min   Activity Tolerance Patient tolerated treatment well   Behavior During Therapy [facility] for tasks assessed/performed      Past Medical History:  Diagnosis Date  . GERD (gastroesophageal reflux disease)    occ    Past Surgical History:  Procedure Laterality Date  . KNEE ARTHROSCOPY WITH ANTERIOR CRUCIATE LIGAMENT (ACL) REPAIR WITH HAMSTRING GRAFT Left 12/04/2016   Procedure: KNEE ARTHROSCOPY WITH ANTERIOR CRUCIATE LIGAMENT (ACL) REPAIR WITH HAMSTRING GRAFT WITH POSSIBLE POSTROLATERAL CORNER RECONSTRUCTION AND LATERAL MENISCAL REPAIR VERSUS RESECTION;  Surgeon: Cammy Copa, MD;  Location: [facility] OR;  Service: Orthopedics;  Laterality: Left;  . KNEE ARTHROSCOPY WITH MENISCAL REPAIR Left 12/04/2016   Procedure: KNEE ARTHROSCOPY WITH MENISCAL REPAIR;  Surgeon: Cammy Copa, MD;  Location: [facility] OR;  Service: Orthopedics;  Laterality: Left;  . NO PAST SURGERIES      There were no vitals filed for this visit.      Subjective Assessment - 12/24/16 1101    Subjective Reports no pain, he is walking intoday without the brace on the leg   Currently in Pain? No/denies   Aggravating Factors  pain and soreness after treatment                         [facility] Adult PT Treatment/Exercise - 12/24/16 0001      Knee/Hip Exercises: Aerobic   Nustep L5 x 6 min      Knee/Hip Exercises: Standing   Heel Raises 2 sets;Both;10 reps   Hip Flexion Left;2 sets;10 reps   Hip Abduction 2 sets;Left;10  reps   Hip Extension Left;2 sets;10 reps   Other Standing Knee Exercises standing SLR, stnading 8" toe clears     Knee/Hip Exercises: Supine   Short Arc Quad Sets Left;2 sets;10 reps   Short Arc Quad Sets Limitations 2#   Heel Slides 2 sets;Left;AROM;10 reps   Other Supine Knee/Hip Exercises feet on ball K2C, bridges     Modalities   Modalities Vasopneumatic     Vasopneumatic   Number Minutes Vasopneumatic  15 minutes   Vasopnuematic Location  Knee   Vasopneumatic Pressure Medium   Vasopneumatic Temperature  34     Manual Therapy   Manual Therapy Passive ROM   Passive ROM L knee flex and ext                  PT Short Term Goals - 12/24/16 1128      PT SHORT TERM GOAL #1   Title independent with initial HEP   Status Achieved           PT Long Term Goals - 12/13/16 1148      PT LONG TERM GOAL #1   Title decrease pain 50%   Time 12   Period Weeks   Status New     PT LONG TERM GOAL #2   Title increase AROM of the left knee to 0-120 degrees  flexion   Time 12   Period Weeks   Status New     PT LONG TERM GOAL #3   Title walk all distances without device with minimal deviation   Time 12   Period Weeks   Status New     PT LONG TERM GOAL #4   Title increase strength of the left knee to 4+/5   Time 12   Period Weeks   Status New     PT LONG TERM GOAL #5   Title go up and down stairs step over step   Time 12   Period Weeks   Status New               Plan - 12/24/16 1126    Clinical Impression Statement Patient 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  He has to put forth great effort for the exercises due to weakness but is able to do them, no c/o pain just fatigue and weakness   PT Next Visit Plan Continue with working on mm activation.   Consulted and Agree with Plan of Care Patient      Patient will benefit from skilled therapeutic intervention in order to improve the following deficits and impairments:   Abnormal gait, Decreased activity tolerance, Decreased balance, Decreased mobility, Decreased strength, Increased edema, Pain, Decreased endurance, Decreased range of motion, Difficulty walking  Visit Diagnosis: Acute pain of left knee  Stiffness of left knee, not elsewhere classified  Localized swelling, mass and lump, left lower limb  Difficulty in walking, not elsewhere classified     Problem List There are no active problems to display for this patient.   Jearld LeschALBRIGHT,Annina Piotrowski W., PT 12/24/2016, 11:29 AM  [facility] [address] [address] [place], [place], [address] Phone: [phone]   Fax:  [phone]  Name: Marcille BlancoKhaled H Mcnamara MRN: [id number] Date of Birth: [birth year]

## 2016-12-26 ENCOUNTER — Encounter: Payer: Self-pay | Admitting: Physical Therapy

## 2016-12-26 ENCOUNTER — Ambulatory Visit: Payer: Self-pay | Admitting: Physical Therapy

## 2016-12-26 ENCOUNTER — Ambulatory Visit (INDEPENDENT_AMBULATORY_CARE_PROVIDER_SITE_OTHER): Payer: Self-pay | Admitting: Orthopedic Surgery

## 2016-12-26 DIAGNOSIS — M25562 Pain in left knee: Secondary | ICD-10-CM

## 2016-12-26 DIAGNOSIS — R262 Difficulty in walking, not elsewhere classified: Secondary | ICD-10-CM

## 2016-12-26 DIAGNOSIS — M25662 Stiffness of left knee, not elsewhere classified: Secondary | ICD-10-CM

## 2016-12-26 DIAGNOSIS — R2242 Localized swelling, mass and lump, left lower limb: Secondary | ICD-10-CM

## 2016-12-26 NOTE — Therapy (Signed)
[place], [place], [address] Phone: [phone]   Fax:  [phone]  Physical Therapy Treatment  Patient Details  Name: Karl Flores MRN: [id number] Date of Birth: October 10, 1995 Referring Provider: Marlou Sa  Encounter Date: 12/26/2016      PT End of Session - 12/26/16 1309    Visit Number 5   Date for PT Re-Evaluation 02/12/17   PT Start Time 1118   PT Stop Time 1210   PT Time Calculation (min) 52 min   Activity Tolerance Patient tolerated treatment well   Behavior During Therapy [facility] for tasks assessed/performed      Past Medical History:  Diagnosis Date  . GERD (gastroesophageal reflux disease)    occ    Past Surgical History:  Procedure Laterality Date  . KNEE ARTHROSCOPY WITH ANTERIOR CRUCIATE LIGAMENT (ACL) REPAIR WITH HAMSTRING GRAFT Left 12/04/2016   Procedure: KNEE ARTHROSCOPY WITH ANTERIOR CRUCIATE LIGAMENT (ACL) REPAIR WITH HAMSTRING GRAFT WITH POSSIBLE POSTROLATERAL CORNER RECONSTRUCTION AND LATERAL MENISCAL REPAIR VERSUS RESECTION;  Surgeon: Meredith Pel, MD;  Location: [place];  Service: Orthopedics;  Laterality: Left;  . KNEE ARTHROSCOPY WITH MENISCAL REPAIR Left 12/04/2016   Procedure: KNEE ARTHROSCOPY WITH MENISCAL REPAIR;  Surgeon: Meredith Pel, MD;  Location: [place];  Service: Orthopedics;  Laterality: Left;  . NO PAST SURGERIES      There were no vitals filed for this visit.      Subjective Assessment - 12/26/16 1120    Subjective Reports that he has been a little stiff.  He is late today, forgot what time appointment was.   Currently in Pain? Yes   Pain Score 1    Pain Location Knee   Pain Orientation Left   Aggravating Factors  bending the knee                         [facility] Adult PT Treatment/Exercise - 12/26/16 0001      Ambulation/Gait   Gait Comments worked on 50% weight bearing used a scale to see how much pressure it takes to get to  50% of his body weight     Knee/Hip Exercises: Stretches   Gastroc Stretch 3 reps;20 seconds     Knee/Hip Exercises: Aerobic   Recumbent Bike 5 minutes   Nustep L5 x 6 min    Other Aerobic UBE constant work 45 watts x 4 minutes     Knee/Hip Exercises: Standing   Heel Raises 2 sets;Both;10 reps   Hip Flexion Left;2 sets;10 reps   Hip Flexion Limitations 2#   Hip Abduction 2 sets;Left;10 reps   Abduction Limitations 2#   Hip Extension Left;2 sets;10 reps   Extension Limitations 2#   Other Standing Knee Exercises standing SLR, standing 8" toe clears     Knee/Hip Exercises: Supine   Other Supine Knee/Hip Exercises feet on ball K2C, bridges     Modalities   Modalities Vasopneumatic     Vasopneumatic   Number Minutes Vasopneumatic  15 minutes   Vasopnuematic Location  Knee   Vasopneumatic Pressure Medium   Vasopneumatic Temperature  34                  PT Short Term Goals - 12/24/16 1128      PT SHORT TERM GOAL #1   Title independent with initial HEP   Status Achieved           PT Long  Term Goals - 12/26/16 1310      PT LONG TERM GOAL #1   Title decrease pain 50%   Status Partially Met               Plan - 12/26/16 1309    Clinical Impression Statement Patient was late today, gives very good effort, his strength and ROM is improving   PT Next Visit Plan Continue with working on mm activation.  Sees MD next week   Consulted and Agree with Plan of Care Patient      Patient will benefit from skilled therapeutic intervention in order to improve the following deficits and impairments:  Abnormal gait, Decreased activity tolerance, Decreased balance, Decreased mobility, Decreased strength, Increased edema, Pain, Decreased endurance, Decreased range of motion, Difficulty walking  Visit Diagnosis: Acute pain of left knee  Stiffness of left knee, not elsewhere classified  Localized swelling, mass and lump, left lower limb  Difficulty in walking,  not elsewhere classified     Problem List There are no active problems to display for this patient.   Sumner Boast., PT 12/26/2016, 1:56 PM  [place], [place], [address] Phone: [phone]   Fax:  [phone]  Name: KELDEN LAVALLEE MRN: [id number] Date of Birth: [birth year]

## 2016-12-27 ENCOUNTER — Ambulatory Visit: Payer: Self-pay | Admitting: Physical Therapy

## 2016-12-27 ENCOUNTER — Encounter: Payer: Self-pay | Admitting: Physical Therapy

## 2016-12-27 DIAGNOSIS — R2242 Localized swelling, mass and lump, left lower limb: Secondary | ICD-10-CM

## 2016-12-27 DIAGNOSIS — M25562 Pain in left knee: Secondary | ICD-10-CM

## 2016-12-27 DIAGNOSIS — R262 Difficulty in walking, not elsewhere classified: Secondary | ICD-10-CM

## 2016-12-27 DIAGNOSIS — M25662 Stiffness of left knee, not elsewhere classified: Secondary | ICD-10-CM

## 2016-12-27 NOTE — Therapy (Signed)
[place], [place], [address] Phone: [phone]   Fax:  [phone]  Physical Therapy Treatment  Patient Details  Name: Karl Flores MRN: [id number] Date of Birth: Jan 15, 1996 Referring Provider: Marlou Sa  Encounter Date: 12/27/2016      PT End of Session - 12/27/16 1228    Visit Number 6   Date for PT Re-Evaluation 02/12/17   PT Start Time 1200   PT Stop Time 1241   PT Time Calculation (min) 41 min   Activity Tolerance Patient tolerated treatment well   Behavior During Therapy [facility] for tasks assessed/performed      Past Medical History:  Diagnosis Date  . GERD (gastroesophageal reflux disease)    occ    Past Surgical History:  Procedure Laterality Date  . KNEE ARTHROSCOPY WITH ANTERIOR CRUCIATE LIGAMENT (ACL) REPAIR WITH HAMSTRING GRAFT Left 12/04/2016   Procedure: KNEE ARTHROSCOPY WITH ANTERIOR CRUCIATE LIGAMENT (ACL) REPAIR WITH HAMSTRING GRAFT WITH POSSIBLE POSTROLATERAL CORNER RECONSTRUCTION AND LATERAL MENISCAL REPAIR VERSUS RESECTION;  Surgeon: Meredith Pel, MD;  Location: [place];  Service: Orthopedics;  Laterality: Left;  . KNEE ARTHROSCOPY WITH MENISCAL REPAIR Left 12/04/2016   Procedure: KNEE ARTHROSCOPY WITH MENISCAL REPAIR;  Surgeon: Meredith Pel, MD;  Location: [place];  Service: Orthopedics;  Laterality: Left;  . NO PAST SURGERIES      There were no vitals filed for this visit.      Subjective Assessment - 12/27/16 1203    Subjective ~15 minutes late, Feeling good no pain   Currently in Pain? No/denies   Pain Score 0-No pain                         [facility] Adult PT Treatment/Exercise - 12/27/16 0001      Knee/Hip Exercises: Aerobic   Recumbent Bike 5 minutes   Nustep L5 x 6 min      Knee/Hip Exercises: Standing   Hip Flexion Left;2 sets;10 reps   Hip Flexion Limitations 2#   Hip Abduction 2 sets;Left;10 reps   Abduction Limitations 2#   Hip Extension Left;2 sets;10 reps   Extension Limitations 2#   Other Standing Knee Exercises standing SLR, standing 8" toe clears     Modalities   Modalities Vasopneumatic     Vasopneumatic   Number Minutes Vasopneumatic  15 minutes   Vasopnuematic Location  Knee   Vasopneumatic Pressure Medium   Vasopneumatic Temperature  34                  PT Short Term Goals - 12/24/16 1128      PT SHORT TERM GOAL #1   Title independent with initial HEP   Status Achieved           PT Long Term Goals - 12/26/16 1310      PT LONG TERM GOAL #1   Title decrease pain 50%   Status Partially Met               Plan - 12/27/16 1229    Clinical Impression Statement Pt ~ 15 minutes for today's treatment, no reports of increase pain with today's exercises. Reports that his knee wants to stay straight with standing hip flexion instead of bending.   Rehab Potential Good   PT Frequency 3x / week   PT Duration 12 weeks   PT Treatment/Interventions ADLs/Self Care Home Management;Cryotherapy;Electrical Stimulation;Gait training;Stair training;Functional mobility training;Patient/family education;Neuromuscular re-education;Balance  training;Therapeutic exercise;Therapeutic activities;Manual techniques;Vasopneumatic Device   PT Next Visit Plan Continue with working on mm activation.  Sees MD next week      Patient will benefit from skilled therapeutic intervention in order to improve the following deficits and impairments:  Abnormal gait, Decreased activity tolerance, Decreased balance, Decreased mobility, Decreased strength, Increased edema, Pain, Decreased endurance, Decreased range of motion, Difficulty walking  Visit Diagnosis: Acute pain of left knee  Stiffness of left knee, not elsewhere classified  Localized swelling, mass and lump, left lower limb  Difficulty in walking, not elsewhere classified     Problem List There are no active problems to display for this  patient.   Scot Jun, PTA 12/27/2016, 12:34 PM  [address] [place], [place], [address] Phone: [phone]   Fax:  [phone]  Name: Karl Flores MRN: [id number] Date of Birth: 10-22-1995

## 2016-12-31 ENCOUNTER — Ambulatory Visit: Payer: Self-pay | Admitting: Physical Therapy

## 2016-12-31 ENCOUNTER — Encounter: Payer: Self-pay | Admitting: Physical Therapy

## 2016-12-31 DIAGNOSIS — R2242 Localized swelling, mass and lump, left lower limb: Secondary | ICD-10-CM

## 2016-12-31 DIAGNOSIS — M25562 Pain in left knee: Secondary | ICD-10-CM

## 2016-12-31 DIAGNOSIS — M25662 Stiffness of left knee, not elsewhere classified: Secondary | ICD-10-CM

## 2016-12-31 DIAGNOSIS — R262 Difficulty in walking, not elsewhere classified: Secondary | ICD-10-CM

## 2016-12-31 NOTE — Therapy (Signed)
[place] [address] [place], [place], [address] Phone: [phone]   Fax:  [phone]  Physical Therapy Treatment  Patient Details  Name: Karl Flores MRN: [id number] Date of Birth: December 17, 1995 Referring Provider: Marlou Sa  Encounter Date: 12/31/2016      PT End of Session - 12/31/16 1230    Visit Number 7   Date for PT Re-Evaluation 02/12/17   PT Start Time 1154   PT Stop Time 1238   PT Time Calculation (min) 44 min   Activity Tolerance Patient tolerated treatment well   Behavior During Therapy [facility] for tasks assessed/performed      Past Medical History:  Diagnosis Date  . GERD (gastroesophageal reflux disease)    occ    Past Surgical History:  Procedure Laterality Date  . KNEE ARTHROSCOPY WITH ANTERIOR CRUCIATE LIGAMENT (ACL) REPAIR WITH HAMSTRING GRAFT Left 12/04/2016   Procedure: KNEE ARTHROSCOPY WITH ANTERIOR CRUCIATE LIGAMENT (ACL) REPAIR WITH HAMSTRING GRAFT WITH POSSIBLE POSTROLATERAL CORNER RECONSTRUCTION AND LATERAL MENISCAL REPAIR VERSUS RESECTION;  Surgeon: Meredith Pel, MD;  Location: [place];  Service: Orthopedics;  Laterality: Left;  . KNEE ARTHROSCOPY WITH MENISCAL REPAIR Left 12/04/2016   Procedure: KNEE ARTHROSCOPY WITH MENISCAL REPAIR;  Surgeon: Meredith Pel, MD;  Location: [place];  Service: Orthopedics;  Laterality: Left;  . NO PAST SURGERIES      There were no vitals filed for this visit.      Subjective Assessment - 12/31/16 1155    Subjective ~8 minutes late "I feel good, I feel really good, I feel like I have made a lot of improvement"   Currently in Pain? No/denies   Pain Score 0-No pain                         [facility] Adult PT Treatment/Exercise - 12/31/16 0001      Knee/Hip Exercises: Aerobic   Recumbent Bike 5 minutes   Nustep L5 x 6 min      Knee/Hip Exercises: Standing   Hip Flexion Left;2 sets;10 reps   Hip Flexion Limitations 2#   Hip  Abduction 2 sets;Left;10 reps   Abduction Limitations 2#   Hip Extension Left;2 sets;10 reps   Extension Limitations 2#   Other Standing Knee Exercises Standing HC curls 2lb 2x10      Knee/Hip Exercises: Supine   Short Arc Quad Sets Left;2 sets;10 reps   Straight Leg Raises Left;2 sets;10 reps   Other Supine Knee/Hip Exercises Green Tband ankle DF x20     Modalities   Modalities Cryotherapy     Cryotherapy   Number Minutes Cryotherapy 10 Minutes   Cryotherapy Location Knee   Type of Cryotherapy Ice pack                  PT Short Term Goals - 12/24/16 1128      PT SHORT TERM GOAL #1   Title independent with initial HEP   Status Achieved           PT Long Term Goals - 12/26/16 1310      PT LONG TERM GOAL #1   Title decrease pain 50%   Status Partially Met               Plan - 12/31/16 1230    Clinical Impression Statement Pt ~ 9 minutes late for today's treatment. Reports overall improvement with ease of motion. able to complete all exercises,  does reports some pain with SAQ and SLR. Reports tightness with stranding HS curls.   Rehab Potential Good   PT Frequency 3x / week   PT Duration 12 weeks   PT Next Visit Plan Continue with working on mm activation.  Sees MD Wednesday      Patient will benefit from skilled therapeutic intervention in order to improve the following deficits and impairments:  Abnormal gait, Decreased activity tolerance, Decreased balance, Decreased mobility, Decreased strength, Increased edema, Pain, Decreased endurance, Decreased range of motion, Difficulty walking  Visit Diagnosis: Acute pain of left knee  Stiffness of left knee, not elsewhere classified  Localized swelling, mass and lump, left lower limb  Difficulty in walking, not elsewhere classified     Problem List There are no active problems to display for this patient.   Scot Jun, PTA 12/31/2016, 12:32 PM  [place] [address] [place], [place], [address] Phone: [phone]   Fax:  [phone]  Name: Karl Flores MRN: [id number] Date of Birth: 12-Feb-1996

## 2017-01-02 ENCOUNTER — Encounter (INDEPENDENT_AMBULATORY_CARE_PROVIDER_SITE_OTHER): Payer: Self-pay | Admitting: Orthopedic Surgery

## 2017-01-02 ENCOUNTER — Ambulatory Visit (INDEPENDENT_AMBULATORY_CARE_PROVIDER_SITE_OTHER): Payer: Self-pay | Admitting: Orthopedic Surgery

## 2017-01-02 ENCOUNTER — Encounter: Payer: Self-pay | Admitting: Physical Therapy

## 2017-01-02 ENCOUNTER — Ambulatory Visit: Payer: Self-pay | Admitting: Physical Therapy

## 2017-01-02 DIAGNOSIS — R262 Difficulty in walking, not elsewhere classified: Secondary | ICD-10-CM

## 2017-01-02 DIAGNOSIS — Z9889 Other specified postprocedural states: Secondary | ICD-10-CM

## 2017-01-02 DIAGNOSIS — M25562 Pain in left knee: Secondary | ICD-10-CM

## 2017-01-02 DIAGNOSIS — M25662 Stiffness of left knee, not elsewhere classified: Secondary | ICD-10-CM

## 2017-01-02 DIAGNOSIS — R2242 Localized swelling, mass and lump, left lower limb: Secondary | ICD-10-CM

## 2017-01-02 NOTE — Therapy (Signed)
[place] [address] [place], [place], [address] Phone: [phone]   Fax:  [phone]  Physical Therapy Treatment  Patient Details  Name: Karl Flores MRN: [id number] Date of Birth: 1996/03/31 Referring Provider: Marlou Sa  Encounter Date: 01/02/2017      PT End of Session - 01/02/17 0930    Visit Number 8   Date for PT Re-Evaluation 02/12/17   PT Start Time 0846   PT Stop Time 0939   PT Time Calculation (min) 53 min   Activity Tolerance Patient tolerated treatment well   Behavior During Therapy [facility] for tasks assessed/performed      Past Medical History:  Diagnosis Date  . GERD (gastroesophageal reflux disease)    occ    Past Surgical History:  Procedure Laterality Date  . KNEE ARTHROSCOPY WITH ANTERIOR CRUCIATE LIGAMENT (ACL) REPAIR WITH HAMSTRING GRAFT Left 12/04/2016   Procedure: KNEE ARTHROSCOPY WITH ANTERIOR CRUCIATE LIGAMENT (ACL) REPAIR WITH HAMSTRING GRAFT WITH POSSIBLE POSTROLATERAL CORNER RECONSTRUCTION AND LATERAL MENISCAL REPAIR VERSUS RESECTION;  Surgeon: Meredith Pel, MD;  Location: [place];  Service: Orthopedics;  Laterality: Left;  . KNEE ARTHROSCOPY WITH MENISCAL REPAIR Left 12/04/2016   Procedure: KNEE ARTHROSCOPY WITH MENISCAL REPAIR;  Surgeon: Meredith Pel, MD;  Location: [place];  Service: Orthopedics;  Laterality: Left;  . NO PAST SURGERIES      There were no vitals filed for this visit.      Subjective Assessment - 01/02/17 0849    Subjective "It is going good man"   Currently in Pain? No/denies   Pain Score 0-No pain            [facility] PT Assessment - 01/02/17 0001      AROM   AROM Assessment Site Knee   Right/Left Knee Left  Supine   Left Knee Extension 5   Left Knee Flexion 113                     [facility] Adult PT Treatment/Exercise - 01/02/17 0001      Knee/Hip Exercises: Aerobic   Recumbent Bike 6 minutes     Knee/Hip Exercises: Standing   Hip Flexion Left;2 sets;15 reps   Hip Flexion Limitations 2lb   Hip Abduction 2 sets;Left;15 reps   Abduction Limitations 2lb   Hip Extension Left;2 sets;15 reps   Extension Limitations 2lb   Other Standing Knee Exercises Standing HS curls 2lb 2x10    Other Standing Knee Exercises Standing TKE ball on wall 2x10      Knee/Hip Exercises: Supine   Quad Sets 2 sets;Left;10 reps   Short Arc Engineer, materials sets;15 reps   Straight Leg Raises Left;10 reps;3 sets     Modalities   Modalities Cryotherapy     Cryotherapy   Number Minutes Cryotherapy 10 Minutes   Cryotherapy Location Knee   Type of Cryotherapy Ice pack                  PT Short Term Goals - 12/24/16 1128      PT SHORT TERM GOAL #1   Title independent with initial HEP   Status Achieved           PT Long Term Goals - 01/02/17 0935      PT LONG TERM GOAL #1   Title decrease pain 50%   Status Achieved     PT LONG TERM GOAL #2   Title increase AROM of the  left knee to 0-120 degrees flexion   Status Partially Met               Plan - 01/02/17 0930    Clinical Impression Statement Pt able to complete all of today's interventions without reports of increase pain. Some difficulty creating quad contraction with supine quad sets, but able to achieve with standing TKE. Minimum lag with SLR. Good effort and strength and effort with all exercises.   Rehab Potential Good   PT Frequency 3x / week   PT Duration 12 weeks   PT Treatment/Interventions ADLs/Self Care Home Management;Cryotherapy;Electrical Stimulation;Gait training;Stair training;Functional mobility training;Patient/family education;Neuromuscular re-education;Balance training;Therapeutic exercise;Therapeutic activities;Manual techniques;Vasopneumatic Device   PT Next Visit Plan Continue with working on mm activation.  Sees MD Today      Patient will benefit from skilled therapeutic intervention in order to improve the following deficits and  impairments:  Abnormal gait, Decreased activity tolerance, Decreased balance, Decreased mobility, Decreased strength, Increased edema, Pain, Decreased endurance, Decreased range of motion, Difficulty walking  Visit Diagnosis: Acute pain of left knee  Stiffness of left knee, not elsewhere classified  Localized swelling, mass and lump, left lower limb  Difficulty in walking, not elsewhere classified     Problem List There are no active problems to display for this patient.   Scot Jun, PTA 01/02/2017, 9:36 AM  [address] [address] [place], [place], [address] Phone: [phone]   Fax:  [phone]  Name: Karl Flores MRN: [id number] Date of Birth: [birth year]

## 2017-01-04 ENCOUNTER — Encounter: Payer: Self-pay | Admitting: Physical Therapy

## 2017-01-04 ENCOUNTER — Ambulatory Visit: Payer: Self-pay | Admitting: Physical Therapy

## 2017-01-04 DIAGNOSIS — R2242 Localized swelling, mass and lump, left lower limb: Secondary | ICD-10-CM

## 2017-01-04 DIAGNOSIS — M25662 Stiffness of left knee, not elsewhere classified: Secondary | ICD-10-CM

## 2017-01-04 DIAGNOSIS — R262 Difficulty in walking, not elsewhere classified: Secondary | ICD-10-CM

## 2017-01-04 DIAGNOSIS — M25562 Pain in left knee: Secondary | ICD-10-CM

## 2017-01-04 NOTE — Therapy (Signed)
[address] [place], [place], [address] Phone: [phone]   Fax:  [phone]  Physical Therapy Treatment  Patient Details  Name: Karl Flores MRN: [id number] Date of Birth: [birth year] Referring Provider: Marlou Sa  Encounter Date: 01/04/2017      PT End of Session - 01/04/17 1146    Visit Number 9   Date for PT Re-Evaluation 02/12/17   PT Start Time 1058   PT Stop Time 1154   PT Time Calculation (min) 56 min      Past Medical History:  Diagnosis Date  . GERD (gastroesophageal reflux disease)    occ    Past Surgical History:  Procedure Laterality Date  . KNEE ARTHROSCOPY WITH ANTERIOR CRUCIATE LIGAMENT (ACL) REPAIR WITH HAMSTRING GRAFT Left 12/04/2016   Procedure: KNEE ARTHROSCOPY WITH ANTERIOR CRUCIATE LIGAMENT (ACL) REPAIR WITH HAMSTRING GRAFT WITH POSSIBLE POSTROLATERAL CORNER RECONSTRUCTION AND LATERAL MENISCAL REPAIR VERSUS RESECTION;  Surgeon: Meredith Pel, MD;  Location: [place];  Service: Orthopedics;  Laterality: Left;  . KNEE ARTHROSCOPY WITH MENISCAL REPAIR Left 12/04/2016   Procedure: KNEE ARTHROSCOPY WITH MENISCAL REPAIR;  Surgeon: Meredith Pel, MD;  Location: [place];  Service: Orthopedics;  Laterality: Left;  . NO PAST SURGERIES      There were no vitals filed for this visit.      Subjective Assessment - 01/04/17 1059    Subjective "Doctor told me that I should be putting more pressure on it"   Currently in Pain? No/denies   Pain Score 0-No pain                         [facility] Adult PT Treatment/Exercise - 01/04/17 0001      Ambulation/Gait   Ambulation/Gait Yes   Ambulation/Gait Assistance 4: Min guard   Ambulation Distance (Feet) 60 Feet   Assistive device R Axillary Crutch   Gait Pattern Step-through pattern   Ambulation Surface Level;Indoor   Gait Comments Pt very fearful to bear weight through LLE.      Knee/Hip Exercises: Aerobic   Recumbent  Bike 6 minutes L1    Nustep L 2 No UE x 6 min      Knee/Hip Exercises: Standing   Other Standing Knee Exercises Standing march 2x10 UE assist   Other Standing Knee Exercises WT shiftd fonr to bacb 2x10, side to side 2x10; Standing TKE red tband 2x10      Knee/Hip Exercises: Seated   Hamstring Curl 2 sets;10 reps;Left                  PT Short Term Goals - 12/24/16 1128      PT SHORT TERM GOAL #1   Title independent with initial HEP   Status Achieved           PT Long Term Goals - 01/02/17 0935      PT LONG TERM GOAL #1   Title decrease pain 50%   Status Achieved     PT LONG TERM GOAL #2   Title increase AROM of the left knee to 0-120 degrees flexion   Status Partially Met               Plan - 01/04/17 1140    Clinical Impression Statement Pt returns from MD with new order to advance WB status. Progressed to wt shifts but pt very fearful to bear with in LLE. Attempted shallow sit to stands from  elevated surface, pt was able to complete with minium compensation. Pt reports no increase in pain just not trusting LLE.   Rehab Potential Good   PT Frequency 3x / week   PT Duration 12 weeks   PT Next Visit Plan Progress WB status and gait      Patient will benefit from skilled therapeutic intervention in order to improve the following deficits and impairments:  Abnormal gait, Decreased activity tolerance, Decreased balance, Decreased mobility, Decreased strength, Increased edema, Pain, Decreased endurance, Decreased range of motion, Difficulty walking  Visit Diagnosis: Acute pain of left knee  Stiffness of left knee, not elsewhere classified  Localized swelling, mass and lump, left lower limb  Difficulty in walking, not elsewhere classified     Problem List There are no active problems to display for this patient.   Scot Jun, PTA 01/04/2017, 11:48 AM  [place], [place], [address] Phone: [phone]   Fax:  [phone]  Name: Karl Flores MRN: [id number] Date of Birth: 03/14/1996

## 2017-01-04 NOTE — Progress Notes (Signed)
   Post-Op Visit Note   Patient: Karl Flores           Date of Birth: 19-Apr-1996           MRN: [id number] Visit Date: 01/02/2017 PCP: Karl MatarJohnson, Deborah B, MD   Assessment & Plan:  Chief Complaint:  Chief Complaint  Patient presents with  . Left Knee - Routine Post Op   Visit Diagnoses: No diagnosis found.  Plan: Karl Flores is a 21 year old patient who is a month out left knee arthroscopy with hamstring anterior cruciate ligament reconstruction doing better.  Therapy 3 times a week.  He's progressed to weightbearing as tolerated.  On exam he has about a 5 flexion contracture on the left knee.  Graft is stable trace effusion is present, given him a heel lift and continue to work on full extension.  Heel at the on the right-hand side.  Gave him a hinge knee brace.  4 week return for clinical recheck.  Continue therapy.  Follow-Up Instructions: Return in about 4 weeks (around 01/30/2017).   Orders:  No orders of the defined types were placed in this encounter.  No orders of the defined types were placed in this encounter.   Imaging: No results found.  PMFS History: There are no active problems to display for this patient.  Past Medical History:  Diagnosis Date  . GERD (gastroesophageal reflux disease)    occ    No family history on file.  Past Surgical History:  Procedure Laterality Date  . KNEE ARTHROSCOPY WITH ANTERIOR CRUCIATE LIGAMENT (ACL) REPAIR WITH HAMSTRING GRAFT Left 12/04/2016   Procedure: KNEE ARTHROSCOPY WITH ANTERIOR CRUCIATE LIGAMENT (ACL) REPAIR WITH HAMSTRING GRAFT WITH POSSIBLE POSTROLATERAL CORNER RECONSTRUCTION AND LATERAL MENISCAL REPAIR VERSUS RESECTION;  Surgeon: Karl Copaean, Scott Gregory, MD;  [facility];  Service: Orthopedics;  Laterality: Left;  . KNEE ARTHROSCOPY WITH MENISCAL REPAIR Left 12/04/2016   Procedure: KNEE ARTHROSCOPY WITH MENISCAL REPAIR;  Surgeon: Karl Copaean, Scott Gregory, MD;  [facility];  Service: Orthopedics;  Laterality: Left;  . NO  PAST SURGERIES     Social History   Occupational History  . Not on file.   Social History Main Topics  . Smoking status: Never Smoker  . Smokeless tobacco: Never Used     Comment: hookah once a week  . Alcohol use No  . Drug use: No  . Sexual activity: No

## 2017-01-07 ENCOUNTER — Encounter: Payer: Self-pay | Admitting: Physical Therapy

## 2017-01-07 ENCOUNTER — Ambulatory Visit: Payer: Self-pay | Admitting: Physical Therapy

## 2017-01-07 DIAGNOSIS — R262 Difficulty in walking, not elsewhere classified: Secondary | ICD-10-CM

## 2017-01-07 DIAGNOSIS — M25662 Stiffness of left knee, not elsewhere classified: Secondary | ICD-10-CM

## 2017-01-07 DIAGNOSIS — M25562 Pain in left knee: Secondary | ICD-10-CM

## 2017-01-07 DIAGNOSIS — R2242 Localized swelling, mass and lump, left lower limb: Secondary | ICD-10-CM

## 2017-01-07 NOTE — Therapy (Signed)
[place] [address] [place], [place], [address] Phone: [phone]   Fax:  [phone]  Physical Therapy Treatment  Patient Details  Name: COLBEY WIRTANEN MRN: [id number] Date of Birth: September 23, 1995 Referring Provider: Marlou Sa  Encounter Date: 01/07/2017      PT End of Session - 01/07/17 0956    Visit Number 10   Date for PT Re-Evaluation 02/12/17   PT Start Time 0918   PT Stop Time 1003   PT Time Calculation (min) 45 min   Activity Tolerance Patient tolerated treatment well   Behavior During Therapy [facility] for tasks assessed/performed      Past Medical History:  Diagnosis Date  . GERD (gastroesophageal reflux disease)    occ    Past Surgical History:  Procedure Laterality Date  . KNEE ARTHROSCOPY WITH ANTERIOR CRUCIATE LIGAMENT (ACL) REPAIR WITH HAMSTRING GRAFT Left 12/04/2016   Procedure: KNEE ARTHROSCOPY WITH ANTERIOR CRUCIATE LIGAMENT (ACL) REPAIR WITH HAMSTRING GRAFT WITH POSSIBLE POSTROLATERAL CORNER RECONSTRUCTION AND LATERAL MENISCAL REPAIR VERSUS RESECTION;  Surgeon: Meredith Pel, MD;  Location: [facility];  Service: Orthopedics;  Laterality: Left;  . KNEE ARTHROSCOPY WITH MENISCAL REPAIR Left 12/04/2016   Procedure: KNEE ARTHROSCOPY WITH MENISCAL REPAIR;  Surgeon: Meredith Pel, MD;  Location: [facility];  Service: Orthopedics;  Laterality: Left;  . NO PAST SURGERIES      There were no vitals filed for this visit.      Subjective Assessment - 01/07/17 0919    Subjective "Feeling good, feeling real good"   Currently in Pain? No/denies   Pain Score 0-No pain                         [facility] Adult PT Treatment/Exercise - 01/07/17 0001      Knee/Hip Exercises: Standing   Other Standing Knee Exercises Standing march 2x10 UE assist   Other Standing Knee Exercises WT shifts front to bacl 2x10, side to side 2x10; Standing TKE red tband 2x10      Knee/Hip Exercises: Seated   Hamstring  Curl 2 sets;Left;15 reps   Sit to Sand 2 sets;10 reps;without UE support     Knee/Hip Exercises: Supine   Short Arc Quad Sets Left;2 sets;15 reps   Short Arc Quad Sets Limitations 2lb   Straight Leg Raises Left;10 reps;2 sets   Straight Leg Raises Limitations 2lb     Modalities   Modalities Cryotherapy     Cryotherapy   Number Minutes Cryotherapy 10 Minutes   Cryotherapy Location Knee   Type of Cryotherapy Ice pack                  PT Short Term Goals - 12/24/16 1128      PT SHORT TERM GOAL #1   Title independent with initial HEP   Status Achieved           PT Long Term Goals - 01/02/17 0935      PT LONG TERM GOAL #1   Title decrease pain 50%   Status Achieved     PT LONG TERM GOAL #2   Title increase AROM of the left knee to 0-120 degrees flexion   Status Partially Met               Plan - 01/07/17 0956    Clinical Impression Statement Pt requested a shorten therapy session today. Pt remains fearful and hesitant to bear full with in LLE.  Does gain a little more confidence as therapy progressed. encouragement required for all WT bearing activities. Good SLR with minium extensor lag.   Rehab Potential Good   PT Frequency 3x / week   PT Duration 12 weeks   PT Treatment/Interventions ADLs/Self Care Home Management;Cryotherapy;Electrical Stimulation;Gait training;Stair training;Functional mobility training;Patient/family education;Neuromuscular re-education;Balance training;Therapeutic exercise;Therapeutic activities;Manual techniques;Vasopneumatic Device   PT Next Visit Plan Progress WB status and gait      Patient will benefit from skilled therapeutic intervention in order to improve the following deficits and impairments:  Abnormal gait, Decreased activity tolerance, Decreased balance, Decreased mobility, Decreased strength, Increased edema, Pain, Decreased endurance, Decreased range of motion, Difficulty walking  Visit Diagnosis: Acute pain of  left knee  Stiffness of left knee, not elsewhere classified  Localized swelling, mass and lump, left lower limb  Difficulty in walking, not elsewhere classified     Problem List There are no active problems to display for this patient.   Scot Jun, PTA 01/07/2017, 9:58 AM  [address] [place], [place], [address] Phone: [phone]   Fax:  [phone]  Name: BRANDEN SHALLENBERGER MRN: [id number] Date of Birth: 1996/01/09

## 2017-01-09 ENCOUNTER — Encounter: Payer: Self-pay | Admitting: Physical Therapy

## 2017-01-09 ENCOUNTER — Ambulatory Visit: Payer: Self-pay | Admitting: Physical Therapy

## 2017-01-09 DIAGNOSIS — M25662 Stiffness of left knee, not elsewhere classified: Secondary | ICD-10-CM

## 2017-01-09 DIAGNOSIS — R2242 Localized swelling, mass and lump, left lower limb: Secondary | ICD-10-CM

## 2017-01-09 DIAGNOSIS — M25562 Pain in left knee: Secondary | ICD-10-CM

## 2017-01-09 DIAGNOSIS — R262 Difficulty in walking, not elsewhere classified: Secondary | ICD-10-CM

## 2017-01-09 NOTE — Therapy (Signed)
[address] [place], [place], [address] Phone: [phone]   Fax:  [phone]  Physical Therapy Treatment  Patient Details  Name: Karl Flores MRN: [id number] Date of Birth: 04/07/1996 Referring Provider: Marlou Sa  Encounter Date: 01/09/2017      PT End of Session - 01/09/17 1226    Visit Number 11   Date for PT Re-Evaluation 02/12/17   PT Start Time 5003   PT Stop Time 1237   PT Time Calculation (min) 52 min   Equipment Utilized During Treatment Gait belt   Activity Tolerance Patient tolerated treatment well   Behavior During Therapy [facility] for tasks assessed/performed      Past Medical History:  Diagnosis Date  . GERD (gastroesophageal reflux disease)    occ    Past Surgical History:  Procedure Laterality Date  . KNEE ARTHROSCOPY WITH ANTERIOR CRUCIATE LIGAMENT (ACL) REPAIR WITH HAMSTRING GRAFT Left 12/04/2016   Procedure: KNEE ARTHROSCOPY WITH ANTERIOR CRUCIATE LIGAMENT (ACL) REPAIR WITH HAMSTRING GRAFT WITH POSSIBLE POSTROLATERAL CORNER RECONSTRUCTION AND LATERAL MENISCAL REPAIR VERSUS RESECTION;  Surgeon: Meredith Pel, MD;  Location: [facility];  Service: Orthopedics;  Laterality: Left;  . KNEE ARTHROSCOPY WITH MENISCAL REPAIR Left 12/04/2016   Procedure: KNEE ARTHROSCOPY WITH MENISCAL REPAIR;  Surgeon: Meredith Pel, MD;  Location: [facility];  Service: Orthopedics;  Laterality: Left;  . NO PAST SURGERIES      There were no vitals filed for this visit.      Subjective Assessment - 01/09/17 1148    Subjective "feeling good"   Currently in Pain? No/denies   Pain Score 0-No pain                         [facility] Adult PT Treatment/Exercise - 01/09/17 0001      Ambulation/Gait   Ambulation/Gait Yes   Ambulation/Gait Assistance 4: Min guard   Ambulation Distance (Feet) 120 Feet   Assistive device None   Gait Pattern Step-to pattern;Step-through pattern;Decreased arm swing -  right;Decreased arm swing - left;Decreased step length - right;Decreased hip/knee flexion - left   Ambulation Surface Level;Indoor   Gait Comments Pt very fearful to bear weight through LLE.      Knee/Hip Exercises: Aerobic   Tread Mill LLE gait simulation.   Recumbent Bike 4 minutes L1   Nustep L 4 No UE x 7 min      Knee/Hip Exercises: Machines for Strengthening   Cybex Leg Press 20lb 2x10 45-0 deg; LLE only 20lb TKE x10      Knee/Hip Exercises: Seated   Sit to Sand 2 sets;10 reps;without UE support     Knee/Hip Exercises: Supine   Short Arc Quad Sets Left;2 sets;10 reps   Short Arc Quad Sets Limitations 3   Straight Leg Raises Left;10 reps;2 sets   Straight Leg Raises Limitations 3     Modalities   Modalities Cryotherapy     Cryotherapy   Number Minutes Cryotherapy 10 Minutes   Cryotherapy Location Knee   Type of Cryotherapy Ice pack                  PT Short Term Goals - 12/24/16 1128      PT SHORT TERM GOAL #1   Title independent with initial HEP   Status Achieved           PT Long Term Goals - 01/02/17 0935      PT  LONG TERM GOAL #1   Title decrease pain 50%   Status Achieved     PT LONG TERM GOAL #2   Title increase AROM of the left knee to 0-120 degrees flexion   Status Partially Met               Plan - 01/09/17 1228    Clinical Impression Statement Pt still fearful to walk on RLE. Gain confidence with CGA and gait belt. Cues not to take too big of step with LLE and to take a bigger step with RLE. Progressed to leg press with little ROM.    Rehab Potential Good   PT Frequency 3x / week   PT Duration 12 weeks   PT Next Visit Plan Stair training with crutch      Patient will benefit from skilled therapeutic intervention in order to improve the following deficits and impairments:  Abnormal gait, Decreased activity tolerance, Decreased balance, Decreased mobility, Decreased strength, Increased edema, Pain, Decreased endurance,  Decreased range of motion, Difficulty walking  Visit Diagnosis: Acute pain of left knee  Stiffness of left knee, not elsewhere classified  Localized swelling, mass and lump, left lower limb  Difficulty in walking, not elsewhere classified     Problem List There are no active problems to display for this patient.   Scot Jun, PTA 01/09/2017, 12:30 PM  [address] [place], [place], [address] Phone: [phone]   Fax:  [phone]  Name: FILIPPO PULS MRN: [id number] Date of Birth: 29-Jun-1995

## 2017-01-11 ENCOUNTER — Encounter: Payer: Self-pay | Admitting: Physical Therapy

## 2017-01-11 ENCOUNTER — Ambulatory Visit: Payer: Self-pay | Admitting: Physical Therapy

## 2017-01-11 DIAGNOSIS — R2242 Localized swelling, mass and lump, left lower limb: Secondary | ICD-10-CM

## 2017-01-11 DIAGNOSIS — M25562 Pain in left knee: Secondary | ICD-10-CM

## 2017-01-11 DIAGNOSIS — M25662 Stiffness of left knee, not elsewhere classified: Secondary | ICD-10-CM

## 2017-01-11 DIAGNOSIS — R262 Difficulty in walking, not elsewhere classified: Secondary | ICD-10-CM

## 2017-01-11 NOTE — Therapy (Signed)
[place] [address] [place], [place], [address] Phone: [phone]   Fax:  [phone]  Physical Therapy Treatment  Patient Details  Name: Karl Flores MRN: [id number] Date of Birth: 08-05-1995 Referring Provider: Marlou Sa  Encounter Date: 01/11/2017      PT End of Session - 01/11/17 1139    Visit Number 12   Date for PT Re-Evaluation 02/12/17   PT Start Time 1100   PT Stop Time 1155   PT Time Calculation (min) 55 min   Activity Tolerance Patient tolerated treatment well   Behavior During Therapy [facility] for tasks assessed/performed      Past Medical History:  Diagnosis Date  . GERD (gastroesophageal reflux disease)    occ    Past Surgical History:  Procedure Laterality Date  . KNEE ARTHROSCOPY WITH ANTERIOR CRUCIATE LIGAMENT (ACL) REPAIR WITH HAMSTRING GRAFT Left 12/04/2016   Procedure: KNEE ARTHROSCOPY WITH ANTERIOR CRUCIATE LIGAMENT (ACL) REPAIR WITH HAMSTRING GRAFT WITH POSSIBLE POSTROLATERAL CORNER RECONSTRUCTION AND LATERAL MENISCAL REPAIR VERSUS RESECTION;  Surgeon: Meredith Pel, MD;  Location: [facility];  Service: Orthopedics;  Laterality: Left;  . KNEE ARTHROSCOPY WITH MENISCAL REPAIR Left 12/04/2016   Procedure: KNEE ARTHROSCOPY WITH MENISCAL REPAIR;  Surgeon: Meredith Pel, MD;  Location: [facility];  Service: Orthopedics;  Laterality: Left;  . NO PAST SURGERIES      There were no vitals filed for this visit.      Subjective Assessment - 01/11/17 1102    Subjective "Feeling good"   Currently in Pain? No/denies   Pain Score 0-No pain                         [facility] Adult PT Treatment/Exercise - 01/11/17 0001      Ambulation/Gait   Ambulation/Gait Yes   Ambulation/Gait Assistance 4: Min guard   Ambulation Distance (Feet) 240 Feet   Assistive device None   Gait Pattern Step-to pattern;Step-through pattern;Decreased arm swing - right;Decreased arm swing - left;Decreased step  length - right;Decreased hip/knee flexion - left   Ambulation Surface Level;Indoor   Stairs Yes   Stairs Assistance 5: Supervision   Stairs Assistance Details (indicate cue type and reason) step to pattern, one axillary crutch R    Stair Management Technique One rail Right;With crutches   Number of Stairs 12   Height of Stairs 6   Gait Comments Pt very fearful to bear weight through LLE.      Knee/Hip Exercises: Aerobic   Recumbent Bike 6 minutes L2      Knee/Hip Exercises: Standing   Heel Raises Both;2 sets;15 reps;2 seconds  black bar    Step Down 2 sets;Right;Hand Hold: 1;Step Height: 4"  3 reps, 5 reps, repors some pain.    Other Standing Knee Exercises Standing TKE ball on wall 2x10     Vasopneumatic   Number Minutes Vasopneumatic  15 minutes   Vasopnuematic Location  Knee   Vasopneumatic Pressure Medium   Vasopneumatic Temperature  34                  PT Short Term Goals - 12/24/16 1128      PT SHORT TERM GOAL #1   Title independent with initial HEP   Status Achieved           PT Long Term Goals - 01/02/17 0935      PT LONG TERM GOAL #1   Title decrease pain  50%   Status Achieved     PT LONG TERM GOAL #2   Title increase AROM of the left knee to 0-120 degrees flexion   Status Partially Met               Plan - 01/11/17 1142    Clinical Impression Statement Pt remains very fearful to walk without AD. Today's treatment focused more on gait, cues given to perform heel strike and toe off. Pt walks with a very tense gait cues given to relax. Does reports some pain with step downs.    Rehab Potential Good   PT Frequency 3x / week   PT Duration 12 weeks   PT Treatment/Interventions ADLs/Self Care Home Management;Cryotherapy;Electrical Stimulation;Gait training;Stair training;Functional mobility training;Patient/family education;Neuromuscular re-education;Balance training;Therapeutic exercise;Therapeutic activities;Manual techniques;Vasopneumatic  Device   PT Next Visit Plan progress gait trial.      Patient will benefit from skilled therapeutic intervention in order to improve the following deficits and impairments:  Abnormal gait, Decreased activity tolerance, Decreased balance, Decreased mobility, Decreased strength, Increased edema, Pain, Decreased endurance, Decreased range of motion, Difficulty walking  Visit Diagnosis: Acute pain of left knee  Stiffness of left knee, not elsewhere classified  Localized swelling, mass and lump, left lower limb  Difficulty in walking, not elsewhere classified     Problem List There are no active problems to display for this patient.   Scot Jun, PTA 01/11/2017, 11:46 AM  [place] [address] [place], [place], [address] Phone: [phone]   Fax:  [phone]  Name: Karl Flores MRN: [id number] Date of Birth: [birth year]

## 2017-01-14 ENCOUNTER — Encounter: Payer: Self-pay | Admitting: Physical Therapy

## 2017-01-14 ENCOUNTER — Ambulatory Visit: Payer: Self-pay | Admitting: Physical Therapy

## 2017-01-14 DIAGNOSIS — R2242 Localized swelling, mass and lump, left lower limb: Secondary | ICD-10-CM

## 2017-01-14 DIAGNOSIS — R262 Difficulty in walking, not elsewhere classified: Secondary | ICD-10-CM

## 2017-01-14 DIAGNOSIS — M25662 Stiffness of left knee, not elsewhere classified: Secondary | ICD-10-CM

## 2017-01-14 DIAGNOSIS — M25562 Pain in left knee: Secondary | ICD-10-CM

## 2017-01-14 NOTE — Therapy (Signed)
[place], [place], [address] Phone: [phone]   Fax:  [phone]  Physical Therapy Treatment  Patient Details  Name: Karl Flores MRN: [id number] Date of Birth: 1995-09-08 Referring Provider: Marlou Sa  Encounter Date: 01/14/2017      PT End of Session - 01/14/17 1516    Visit Number 13   Date for PT Re-Evaluation 02/12/17   PT Start Time 1430   PT Stop Time 1529   PT Time Calculation (min) 59 min   Activity Tolerance Patient tolerated treatment well   Behavior During Therapy [facility] for tasks assessed/performed      Past Medical History:  Diagnosis Date  . GERD (gastroesophageal reflux disease)    occ    Past Surgical History:  Procedure Laterality Date  . KNEE ARTHROSCOPY WITH ANTERIOR CRUCIATE LIGAMENT (ACL) REPAIR WITH HAMSTRING GRAFT Left 12/04/2016   Procedure: KNEE ARTHROSCOPY WITH ANTERIOR CRUCIATE LIGAMENT (ACL) REPAIR WITH HAMSTRING GRAFT WITH POSSIBLE POSTROLATERAL CORNER RECONSTRUCTION AND LATERAL MENISCAL REPAIR VERSUS RESECTION;  Surgeon: Meredith Pel, MD;  Location: [facility];  Service: Orthopedics;  Laterality: Left;  . KNEE ARTHROSCOPY WITH MENISCAL REPAIR Left 12/04/2016   Procedure: KNEE ARTHROSCOPY WITH MENISCAL REPAIR;  Surgeon: Meredith Pel, MD;  Location: [facility];  Service: Orthopedics;  Laterality: Left;  . NO PAST SURGERIES      There were no vitals filed for this visit.      Subjective Assessment - 01/14/17 1433    Subjective "It is going good"   Currently in Pain? No/denies   Pain Score 0-No pain                         [facility] Adult PT Treatment/Exercise - 01/14/17 0001      High Level Balance   High Level Balance Comments SLS LLE 4 X 10'' some ue assist     Knee/Hip Exercises: Aerobic   Recumbent Bike 6 minutes L2      Knee/Hip Exercises: Machines for Strengthening   Cybex Knee Flexion 20lb 2x15    Cybex Leg Press 20lb 2x10 45-0  deg; LLE only 20lb TKE x10      Knee/Hip Exercises: Standing   Heel Raises Both;2 sets;15 reps;2 seconds  black bar    Forward Step Up Left;2 sets;10 reps;Hand Hold: 0;Step Height: 4"   Other Standing Knee Exercises wall sits 4 x15''   Other Standing Knee Exercises Standing TKE ball on wall 2x10     Vasopneumatic   Number Minutes Vasopneumatic  15 minutes   Vasopnuematic Location  Knee   Vasopneumatic Pressure Medium   Vasopneumatic Temperature  34                  PT Short Term Goals - 12/24/16 1128      PT SHORT TERM GOAL #1   Title independent with initial HEP   Status Achieved           PT Long Term Goals - 01/02/17 0935      PT LONG TERM GOAL #1   Title decrease pain 50%   Status Achieved     PT LONG TERM GOAL #2   Title increase AROM of the left knee to 0-120 degrees flexion   Status Partially Met               Plan - 01/14/17 1521    Clinical Impression Statement Pt enters clinic without  AD, amb with brace on LLE. Pt with better tolerance of WT bearing on LLE. Does report a little pain with SLS. Pt with good concentric and eccentric control with step ups. Visible shaking with wall sits for 10 sec.    Rehab Potential Good   PT Frequency 3x / week   PT Duration 12 weeks   PT Treatment/Interventions ADLs/Self Care Home Management;Cryotherapy;Electrical Stimulation;Gait training;Stair training;Functional mobility training;Patient/family education;Neuromuscular re-education;Balance training;Therapeutic exercise;Therapeutic activities;Manual techniques;Vasopneumatic Device   PT Next Visit Plan Progress per protocol      Patient will benefit from skilled therapeutic intervention in order to improve the following deficits and impairments:  Abnormal gait, Decreased activity tolerance, Decreased balance, Decreased mobility, Decreased strength, Increased edema, Pain, Decreased endurance, Decreased range of motion, Difficulty walking  Visit  Diagnosis: Stiffness of left knee, not elsewhere classified  Acute pain of left knee  Localized swelling, mass and lump, left lower limb  Difficulty in walking, not elsewhere classified     Problem List There are no active problems to display for this patient.   Scot Jun, PTA 01/14/2017, 3:24 PM  [address] [place], [place], [address] Phone: [phone]   Fax:  [phone]  Name: Karl Flores MRN: [id number] Date of Birth: Sep 11, 1995

## 2017-01-16 ENCOUNTER — Encounter: Payer: Self-pay | Admitting: Physical Therapy

## 2017-01-16 ENCOUNTER — Ambulatory Visit: Payer: Self-pay | Attending: Orthopedic Surgery | Admitting: Physical Therapy

## 2017-01-16 DIAGNOSIS — R262 Difficulty in walking, not elsewhere classified: Secondary | ICD-10-CM | POA: Insufficient documentation

## 2017-01-16 DIAGNOSIS — M25562 Pain in left knee: Secondary | ICD-10-CM | POA: Insufficient documentation

## 2017-01-16 DIAGNOSIS — R2242 Localized swelling, mass and lump, left lower limb: Secondary | ICD-10-CM | POA: Insufficient documentation

## 2017-01-16 DIAGNOSIS — M25662 Stiffness of left knee, not elsewhere classified: Secondary | ICD-10-CM | POA: Insufficient documentation

## 2017-01-16 NOTE — Therapy (Signed)
[place] [address] [place], [place], [address] Phone: [phone]   Fax:  [phone]  Physical Therapy Treatment  Patient Details  Name: Karl Flores MRN: [id number] Date of Birth: 02-19-1996 Referring Provider: Marlou Sa  Encounter Date: 01/16/2017      PT End of Session - 01/16/17 0925    Visit Number 14   Date for PT Re-Evaluation 02/12/17   PT Start Time 0845   PT Stop Time 0935   PT Time Calculation (min) 50 min   Activity Tolerance Patient tolerated treatment well   Behavior During Therapy [facility] for tasks assessed/performed      Past Medical History:  Diagnosis Date  . GERD (gastroesophageal reflux disease)    occ    Past Surgical History:  Procedure Laterality Date  . KNEE ARTHROSCOPY WITH ANTERIOR CRUCIATE LIGAMENT (ACL) REPAIR WITH HAMSTRING GRAFT Left 12/04/2016   Procedure: KNEE ARTHROSCOPY WITH ANTERIOR CRUCIATE LIGAMENT (ACL) REPAIR WITH HAMSTRING GRAFT WITH POSSIBLE POSTROLATERAL CORNER RECONSTRUCTION AND LATERAL MENISCAL REPAIR VERSUS RESECTION;  Surgeon: Meredith Pel, MD;  Location: [facility];  Service: Orthopedics;  Laterality: Left;  . KNEE ARTHROSCOPY WITH MENISCAL REPAIR Left 12/04/2016   Procedure: KNEE ARTHROSCOPY WITH MENISCAL REPAIR;  Surgeon: Meredith Pel, MD;  Location: [facility];  Service: Orthopedics;  Laterality: Left;  . NO PAST SURGERIES      There were no vitals filed for this visit.      Subjective Assessment - 01/16/17 0848    Subjective Pt reports that everything is good   Currently in Pain? No/denies   Pain Score 0-No pain                         [facility] Adult PT Treatment/Exercise - 01/16/17 0001      High Level Balance   High Level Balance Comments SLS LLE 4x 10''; SLS with ball toss 2x15     Knee/Hip Exercises: Aerobic   Recumbent Bike 6 minutes L2      Knee/Hip Exercises: Machines for Strengthening   Cybex Knee Flexion 25lb 2x15    Cybex  Leg Press 20lb 2x10 45-0 deg; LLE only 20lb TKE x10      Knee/Hip Exercises: Standing   Lateral Step Up 2 sets;Left;10 reps;Step Height: 4";Hand Hold: 0   Forward Step Up Left;2 sets;10 reps;Hand Hold: 0;Step Height: 4"   Step Down 2 sets;Right;Hand Hold: 1;Step Height: 4";10 reps     Modalities   Modalities Cryotherapy     Cryotherapy   Number Minutes Cryotherapy 10 Minutes   Cryotherapy Location Knee   Type of Cryotherapy Ice pack                  PT Short Term Goals - 12/24/16 1128      PT SHORT TERM GOAL #1   Title independent with initial HEP   Status Achieved           PT Long Term Goals - 01/16/17 0926      PT LONG TERM GOAL #2   Title increase AROM of the left knee to 0-120 degrees flexion   Status Partially Met     PT LONG TERM GOAL #3   Title walk all distances without device with minimal deviation   Status On-going     PT LONG TERM GOAL #4   Title increase strength of the left knee to 4+/5   Status On-going  Plan - 01/16/17 0927    Clinical Impression Statement Pt demos better confidence ambulation, continues to ambulate with a slight limp. Does report some R knee pain with step downs. Initial instability with SL activity but got better as he went on. All squatting interventions < 90 deg.   Rehab Potential Good   PT Frequency 3x / week   PT Duration 12 weeks   PT Treatment/Interventions ADLs/Self Care Home Management;Cryotherapy;Electrical Stimulation;Gait training;Stair training;Functional mobility training;Patient/family education;Neuromuscular re-education;Balance training;Therapeutic exercise;Therapeutic activities;Manual techniques;Vasopneumatic Device   PT Next Visit Plan Progress per protocol      Patient will benefit from skilled therapeutic intervention in order to improve the following deficits and impairments:  Abnormal gait, Decreased activity tolerance, Decreased balance, Decreased mobility, Decreased  strength, Increased edema, Pain, Decreased endurance, Decreased range of motion, Difficulty walking  Visit Diagnosis: Stiffness of left knee, not elsewhere classified  Acute pain of left knee  Difficulty in walking, not elsewhere classified  Localized swelling, mass and lump, left lower limb     Problem List There are no active problems to display for this patient.   Scot Jun, PTA 01/16/2017, 9:28 AM  [place], [place], [address] Phone: [phone]   Fax:  [phone]  Name: DEAVON PODGORSKI MRN: [id number] Date of Birth: 04-21-1996

## 2017-01-18 ENCOUNTER — Encounter: Payer: Self-pay | Admitting: Physical Therapy

## 2017-01-18 ENCOUNTER — Ambulatory Visit: Payer: Self-pay | Admitting: Physical Therapy

## 2017-01-18 DIAGNOSIS — R2242 Localized swelling, mass and lump, left lower limb: Secondary | ICD-10-CM

## 2017-01-18 DIAGNOSIS — M25562 Pain in left knee: Secondary | ICD-10-CM

## 2017-01-18 DIAGNOSIS — R262 Difficulty in walking, not elsewhere classified: Secondary | ICD-10-CM

## 2017-01-18 DIAGNOSIS — M25662 Stiffness of left knee, not elsewhere classified: Secondary | ICD-10-CM

## 2017-01-18 NOTE — Therapy (Signed)
[place] [place], [place], [address] Phone: [phone]   Fax:  [phone]  Physical Therapy Treatment  Patient Details  Name: Karl Flores MRN: [id number] Date of Birth: [birth year] Referring Provider: Marlou Sa  Encounter Date: 01/18/2017      PT End of Session - 01/18/17 1141    Visit Number 16   Date for PT Re-Evaluation 02/12/17   PT Start Time 1100   PT Stop Time 1154   PT Time Calculation (min) 54 min   Activity Tolerance Patient tolerated treatment well   Behavior During Therapy [facility] for tasks assessed/performed      Past Medical History:  Diagnosis Date  . GERD (gastroesophageal reflux disease)    occ    Past Surgical History:  Procedure Laterality Date  . KNEE ARTHROSCOPY WITH ANTERIOR CRUCIATE LIGAMENT (ACL) REPAIR WITH HAMSTRING GRAFT Left 12/04/2016   Procedure: KNEE ARTHROSCOPY WITH ANTERIOR CRUCIATE LIGAMENT (ACL) REPAIR WITH HAMSTRING GRAFT WITH POSSIBLE POSTROLATERAL CORNER RECONSTRUCTION AND LATERAL MENISCAL REPAIR VERSUS RESECTION;  Surgeon: Meredith Pel, MD;  Location: [place];  Service: Orthopedics;  Laterality: Left;  . KNEE ARTHROSCOPY WITH MENISCAL REPAIR Left 12/04/2016   Procedure: KNEE ARTHROSCOPY WITH MENISCAL REPAIR;  Surgeon: Meredith Pel, MD;  Location: [place];  Service: Orthopedics;  Laterality: Left;  . NO PAST SURGERIES      There were no vitals filed for this visit.      Subjective Assessment - 01/18/17 1107    Subjective Pt reports that things are going really good.   Currently in Pain? No/denies   Pain Score 0-No pain                         [facility] Adult PT Treatment/Exercise - 01/18/17 0001      High Level Balance   High Level Balance Comments SLS on BOSU and toe taps   HHA X1     Knee/Hip Exercises: Machines for Strengthening   Cybex Leg Press 20lb 2x15 45-0 deg; LLE only 20lb TKE x10      Knee/Hip Exercises: Standing   Lateral Step Up 2 sets;Left;10 reps;Hand Hold: 0;Step Height: 6"   Forward Step Up Left;2 sets;10 reps;Hand Hold: 0;Step Height: 6"   Walking with Sports Cord 20lb 2x10   Other Standing Knee Exercises SLS on airex with ball toss x10     Vasopneumatic   Number Minutes Vasopneumatic  15 minutes   Vasopnuematic Location  Knee   Vasopneumatic Pressure Medium   Vasopneumatic Temperature  34                  PT Short Term Goals - 12/24/16 1128      PT SHORT TERM GOAL #1   Title independent with initial HEP   Status Achieved           PT Long Term Goals - 01/16/17 0926      PT LONG TERM GOAL #2   Title increase AROM of the left knee to 0-120 degrees flexion   Status Partially Met     PT LONG TERM GOAL #3   Title walk all distances without device with minimal deviation   Status On-going     PT LONG TERM GOAL #4   Title increase strength of the left knee to 4+/5   Status On-going               Plan - 01/18/17 1141  Clinical Impression Statement Pt able to advance to more balance activities with BOSU and airex. Pt has also progressed to six inch step up with minium compensation. Pt still walk with a slight limp.   Rehab Potential Good   PT Frequency 3x / week   PT Duration 12 weeks   PT Next Visit Plan Progress per protocol      Patient will benefit from skilled therapeutic intervention in order to improve the following deficits and impairments:  Abnormal gait, Decreased activity tolerance, Decreased balance, Decreased mobility, Decreased strength, Increased edema, Pain, Decreased endurance, Decreased range of motion, Difficulty walking  Visit Diagnosis: Acute pain of left knee  Difficulty in walking, not elsewhere classified  Localized swelling, mass and lump, left lower limb  Stiffness of left knee, not elsewhere classified     Problem List There are no active problems to display for this patient.   Scot Jun, PTA 01/18/2017, 11:44  AM  [address] [place], [place], [address] Phone: [phone]   Fax:  [phone]  Name: Karl Flores MRN: [id number] Date of Birth: [birth year]

## 2017-01-21 ENCOUNTER — Ambulatory Visit: Payer: Self-pay | Admitting: Physical Therapy

## 2017-01-21 ENCOUNTER — Encounter: Payer: Self-pay | Admitting: Physical Therapy

## 2017-01-21 DIAGNOSIS — R262 Difficulty in walking, not elsewhere classified: Secondary | ICD-10-CM

## 2017-01-21 DIAGNOSIS — M25662 Stiffness of left knee, not elsewhere classified: Secondary | ICD-10-CM

## 2017-01-21 DIAGNOSIS — R2242 Localized swelling, mass and lump, left lower limb: Secondary | ICD-10-CM

## 2017-01-21 DIAGNOSIS — M25562 Pain in left knee: Secondary | ICD-10-CM

## 2017-01-21 NOTE — Therapy (Signed)
[place] [address] [place], [place], [address] Phone: [phone]   Fax:  [phone]  Physical Therapy Treatment  Patient Details  Name: Karl Flores MRN: [id number] Date of Birth: 1996/06/03 Referring Provider: Marlou Sa  Encounter Date: 01/21/2017      PT End of Session - 01/21/17 1231    Visit Number 17   Date for PT Re-Evaluation 02/12/17   PT Start Time 1149   PT Stop Time 1245   PT Time Calculation (min) 56 min   Activity Tolerance Patient tolerated treatment well   Behavior During Therapy [facility] for tasks assessed/performed      Past Medical History:  Diagnosis Date  . GERD (gastroesophageal reflux disease)    occ    Past Surgical History:  Procedure Laterality Date  . KNEE ARTHROSCOPY WITH ANTERIOR CRUCIATE LIGAMENT (ACL) REPAIR WITH HAMSTRING GRAFT Left 12/04/2016   Procedure: KNEE ARTHROSCOPY WITH ANTERIOR CRUCIATE LIGAMENT (ACL) REPAIR WITH HAMSTRING GRAFT WITH POSSIBLE POSTROLATERAL CORNER RECONSTRUCTION AND LATERAL MENISCAL REPAIR VERSUS RESECTION;  Surgeon: Meredith Pel, MD;  Location: [place];  Service: Orthopedics;  Laterality: Left;  . KNEE ARTHROSCOPY WITH MENISCAL REPAIR Left 12/04/2016   Procedure: KNEE ARTHROSCOPY WITH MENISCAL REPAIR;  Surgeon: Meredith Pel, MD;  Location: [place];  Service: Orthopedics;  Laterality: Left;  . NO PAST SURGERIES      There were no vitals filed for this visit.      Subjective Assessment - 01/21/17 1151    Subjective "Feeling good man"   Currently in Pain? No/denies   Pain Score 0-No pain                         [facility] Adult PT Treatment/Exercise - 01/21/17 0001      Knee/Hip Exercises: Aerobic   Elliptical I5 R5 x4mn   Recumbent Bike 6 minutes L2      Knee/Hip Exercises: Machines for Strengthening   Cybex Knee Flexion 25lb 2x15    Cybex Leg Press 20lb 2x15 45-0 deg; LLE only 20lb TKE 2x15      Knee/Hip Exercises:  Standing   Hip ADduction Left;1 set;15 reps   Hip ADduction Limitations 5   Hip Abduction Left;15 reps;1 set;Knee straight   Abduction Limitations 5   Lateral Step Up 2 sets;Left;10 reps;Hand Hold: 0;Step Height: 6"   Forward Step Up Left;2 sets;10 reps;Hand Hold: 0;Step Height: 6"  5lb explosive    Other Standing Knee Exercises mini lunges at sink 3x5 each    Other Standing Knee Exercises SLS on airex with ball toss 2x10     Vasopneumatic   Number Minutes Vasopneumatic  15 minutes   Vasopnuematic Location  Knee   Vasopneumatic Pressure Medium   Vasopneumatic Temperature  34                  PT Short Term Goals - 12/24/16 1128      PT SHORT TERM GOAL #1   Title independent with initial HEP   Status Achieved           PT Long Term Goals - 01/16/17 0926      PT LONG TERM GOAL #2   Title increase AROM of the left knee to 0-120 degrees flexion   Status Partially Met     PT LONG TERM GOAL #3   Title walk all distances without device with minimal deviation   Status On-going     PT  LONG TERM GOAL #4   Title increase strength of the left knee to 4+/5   Status On-going               Plan - 01/21/17 1232    Clinical Impression Statement Pt tolerated today's today's treatment well. Progressed to partial lunges with little pain. Some instability with SL on airex with ball toss.   Rehab Potential Good   PT Frequency 3x / week   PT Duration 12 weeks   PT Treatment/Interventions ADLs/Self Care Home Management;Cryotherapy;Electrical Stimulation;Gait training;Stair training;Functional mobility training;Patient/family education;Neuromuscular re-education;Balance training;Therapeutic exercise;Therapeutic activities;Manual techniques;Vasopneumatic Device   PT Next Visit Plan Progress per protocol      Patient will benefit from skilled therapeutic intervention in order to improve the following deficits and impairments:  Abnormal gait, Decreased activity tolerance,  Decreased balance, Decreased mobility, Decreased strength, Increased edema, Pain, Decreased endurance, Decreased range of motion, Difficulty walking  Visit Diagnosis: Acute pain of left knee  Difficulty in walking, not elsewhere classified  Localized swelling, mass and lump, left lower limb  Stiffness of left knee, not elsewhere classified     Problem List There are no active problems to display for this patient.   Scot Jun, PTA 01/21/2017, 12:35 PM  [address] [place], [place], [address] Phone: [phone]   Fax:  [phone]  Name: ZHYON ANTENUCCI MRN: [id number] Date of Birth: 1996/01/01

## 2017-01-23 ENCOUNTER — Ambulatory Visit: Payer: Self-pay | Admitting: Physical Therapy

## 2017-01-23 ENCOUNTER — Encounter: Payer: Self-pay | Admitting: Physical Therapy

## 2017-01-23 DIAGNOSIS — M25662 Stiffness of left knee, not elsewhere classified: Secondary | ICD-10-CM

## 2017-01-23 DIAGNOSIS — R262 Difficulty in walking, not elsewhere classified: Secondary | ICD-10-CM

## 2017-01-23 DIAGNOSIS — R2242 Localized swelling, mass and lump, left lower limb: Secondary | ICD-10-CM

## 2017-01-23 DIAGNOSIS — M25562 Pain in left knee: Secondary | ICD-10-CM

## 2017-01-23 NOTE — Therapy (Signed)
[place] [address] [place], [place], [address] Phone: [phone]   Fax:  [phone]  Physical Therapy Treatment  Patient Details  Name: Karl Flores MRN: [id number] Date of Birth: 10-Apr-1996 Referring Provider: Marlou Sa  Encounter Date: 01/23/2017      PT End of Session - 01/23/17 1228    Visit Number 18   Date for PT Re-Evaluation 02/12/17   PT Start Time 6962   PT Stop Time 1239   PT Time Calculation (min) 54 min   Activity Tolerance Patient tolerated treatment well   Behavior During Therapy [facility] for tasks assessed/performed      Past Medical History:  Diagnosis Date  . GERD (gastroesophageal reflux disease)    occ    Past Surgical History:  Procedure Laterality Date  . KNEE ARTHROSCOPY WITH ANTERIOR CRUCIATE LIGAMENT (ACL) REPAIR WITH HAMSTRING GRAFT Left 12/04/2016   Procedure: KNEE ARTHROSCOPY WITH ANTERIOR CRUCIATE LIGAMENT (ACL) REPAIR WITH HAMSTRING GRAFT WITH POSSIBLE POSTROLATERAL CORNER RECONSTRUCTION AND LATERAL MENISCAL REPAIR VERSUS RESECTION;  Surgeon: Meredith Pel, MD;  Location: [place];  Service: Orthopedics;  Laterality: Left;  . KNEE ARTHROSCOPY WITH MENISCAL REPAIR Left 12/04/2016   Procedure: KNEE ARTHROSCOPY WITH MENISCAL REPAIR;  Surgeon: Meredith Pel, MD;  Location: [place];  Service: Orthopedics;  Laterality: Left;  . NO PAST SURGERIES      There were no vitals filed for this visit.      Subjective Assessment - 01/23/17 1152    Subjective "It feels good"   Currently in Pain? No/denies   Pain Score 0-No pain                         [facility] Adult PT Treatment/Exercise - 01/23/17 0001      Knee/Hip Exercises: Aerobic   Elliptical I10 R5 x63mn   Recumbent Bike 5 minutes L2      Knee/Hip Exercises: Standing   Lateral Step Up 2 sets;Left;10 reps;Hand Hold: 0;Step Height: 8"   Forward Step Up Left;2 sets;10 reps;Hand Hold: 0;Step Height: 8"   Walking  with Sports Cord 40lb 2x10   Other Standing Knee Exercises mini lunges at sink 3x5 each    Other Standing Knee Exercises SLS on airex with ball toss 3x15     Knee/Hip Exercises: Seated   Long Arc Quad Left;3 sets;15 reps;Weights  90-30   Long Arc Quad Weight 2 lbs.     Vasopneumatic   Number Minutes Vasopneumatic  15 minutes   Vasopnuematic Location  Knee   Vasopneumatic Pressure Medium   Vasopneumatic Temperature  34                  PT Short Term Goals - 12/24/16 1128      PT SHORT TERM GOAL #1   Title independent with initial HEP   Status Achieved           PT Long Term Goals - 01/16/17 0926      PT LONG TERM GOAL #2   Title increase AROM of the left knee to 0-120 degrees flexion   Status Partially Met     PT LONG TERM GOAL #3   Title walk all distances without device with minimal deviation   Status On-going     PT LONG TERM GOAL #4   Title increase strength of the left knee to 4+/5   Status On-going  Plan - 01/23/17 1229    Clinical Impression Statement Pt completed all of today's exercises, does reports some pain with mini lunge. Instability with SLS. fatigues quick with today's activity.   Rehab Potential Good   PT Frequency 3x / week   PT Duration 12 weeks   PT Treatment/Interventions ADLs/Self Care Home Management;Cryotherapy;Electrical Stimulation;Gait training;Stair training;Functional mobility training;Patient/family education;Neuromuscular re-education;Balance training;Therapeutic exercise;Therapeutic activities;Manual techniques;Vasopneumatic Device   PT Next Visit Plan Progress per protocol      Patient will benefit from skilled therapeutic intervention in order to improve the following deficits and impairments:  Abnormal gait, Decreased activity tolerance, Decreased balance, Decreased mobility, Decreased strength, Increased edema, Pain, Decreased endurance, Decreased range of motion, Difficulty walking  Visit  Diagnosis: Acute pain of left knee  Difficulty in walking, not elsewhere classified  Localized swelling, mass and lump, left lower limb  Stiffness of left knee, not elsewhere classified     Problem List There are no active problems to display for this patient.   Scot Jun, PTA 01/23/2017, 12:31 PM  [address] [place], [place], [address] Phone: [phone]   Fax:  [phone]  Name: Karl Flores MRN: [id number] Date of Birth: 1996-01-13

## 2017-01-25 ENCOUNTER — Ambulatory Visit: Payer: Self-pay | Admitting: Physical Therapy

## 2017-01-30 ENCOUNTER — Encounter: Payer: Self-pay | Admitting: Rehabilitation

## 2017-01-30 ENCOUNTER — Ambulatory Visit: Payer: Self-pay | Admitting: Rehabilitation

## 2017-01-30 DIAGNOSIS — R2242 Localized swelling, mass and lump, left lower limb: Secondary | ICD-10-CM

## 2017-01-30 DIAGNOSIS — M25562 Pain in left knee: Secondary | ICD-10-CM

## 2017-01-30 DIAGNOSIS — M25662 Stiffness of left knee, not elsewhere classified: Secondary | ICD-10-CM

## 2017-01-30 DIAGNOSIS — R262 Difficulty in walking, not elsewhere classified: Secondary | ICD-10-CM

## 2017-01-30 NOTE — Therapy (Signed)
[address] [place], [place], [address] Phone: [phone]   Fax:  [phone]  Physical Therapy Treatment  Patient Details  Name: Karl Flores MRN: [id number] Date of Birth: 27-May-1996 Referring Provider: Marlou Sa  Encounter Date: 01/30/2017      PT End of Session - 01/30/17 1109    Visit Number 19   Date for PT Re-Evaluation 02/12/17   PT Start Time 1020   PT Stop Time 1117   PT Time Calculation (min) 57 min   Activity Tolerance Patient tolerated treatment well   Behavior During Therapy [facility] for tasks assessed/performed      Past Medical History:  Diagnosis Date  . GERD (gastroesophageal reflux disease)    occ    Past Surgical History:  Procedure Laterality Date  . KNEE ARTHROSCOPY WITH ANTERIOR CRUCIATE LIGAMENT (ACL) REPAIR WITH HAMSTRING GRAFT Left 12/04/2016   Procedure: KNEE ARTHROSCOPY WITH ANTERIOR CRUCIATE LIGAMENT (ACL) REPAIR WITH HAMSTRING GRAFT WITH POSSIBLE POSTROLATERAL CORNER RECONSTRUCTION AND LATERAL MENISCAL REPAIR VERSUS RESECTION;  Surgeon: Meredith Pel, MD;  Location: [place];  Service: Orthopedics;  Laterality: Left;  . KNEE ARTHROSCOPY WITH MENISCAL REPAIR Left 12/04/2016   Procedure: KNEE ARTHROSCOPY WITH MENISCAL REPAIR;  Surgeon: Meredith Pel, MD;  Location: [place];  Service: Orthopedics;  Laterality: Left;  . NO PAST SURGERIES      There were no vitals filed for this visit.      Subjective Assessment - 01/30/17 1026    Subjective no complaints   Currently in Pain? No/denies                         [facility] Adult PT Treatment/Exercise - 01/30/17 0001      Knee/Hip Exercises: Aerobic   Elliptical I10 R5 x20mn   Recumbent Bike 5 minutes L2      Knee/Hip Exercises: Standing   Lateral Step Up 2 sets;Left;10 reps;Hand Hold: 0;Step Height: 8"   Forward Step Up Left;2 sets;10 reps;Hand Hold: 0;Step Height: 8"   Walking with Sports Cord 40lb 2x10  all directions   Other Standing Knee Exercises SLS ball toss at rebounder 3x10, march on rebounder no hands     Knee/Hip Exercises: Seated   Long Arc Quad 3 sets;15 reps   Long Arc Quad Weight 5 lbs.     Vasopneumatic   Number Minutes Vasopneumatic  15 minutes   Vasopnuematic Location  Knee   Vasopneumatic Pressure Medium   Vasopneumatic Temperature  34                  PT Short Term Goals - 12/24/16 1128      PT SHORT TERM GOAL #1   Title independent with initial HEP   Status Achieved           PT Long Term Goals - 01/16/17 0926      PT LONG TERM GOAL #2   Title increase AROM of the left knee to 0-120 degrees flexion   Status Partially Met     PT LONG TERM GOAL #3   Title walk all distances without device with minimal deviation   Status On-going     PT LONG TERM GOAL #4   Title increase strength of the left knee to 4+/5   Status On-going               Plan - 01/30/17 1110    Clinical Impression Statement Tolerated all  well with instability and apprehension present with SL stance  and lateral mobility.  Also quad and hamstring weakness evident.     PT Treatment/Interventions ADLs/Self Care Home Management;Cryotherapy;Electrical Stimulation;Gait training;Stair training;Functional mobility training;Patient/family education;Neuromuscular re-education;Balance training;Therapeutic exercise;Therapeutic activities;Manual techniques;Vasopneumatic Device   PT Next Visit Plan Progress per prtocol      Patient will benefit from skilled therapeutic intervention in order to improve the following deficits and impairments:  Abnormal gait, Decreased activity tolerance, Decreased balance, Decreased mobility, Decreased strength, Increased edema, Pain, Decreased endurance, Decreased range of motion, Difficulty walking  Visit Diagnosis: Acute pain of left knee  Difficulty in walking, not elsewhere classified  Localized swelling, mass and lump, left lower  limb  Stiffness of left knee, not elsewhere classified     Problem List There are no active problems to display for this patient.   Stark Bray, DPT, CMP 01/30/2017, 11:15 AM  [address] [place], [place], [address] Phone: [phone]   Fax:  [phone]  Name: KALEEM SARTWELL MRN: [id number] Date of Birth: 1995-11-22

## 2017-02-01 ENCOUNTER — Ambulatory Visit: Payer: Self-pay | Admitting: Physical Therapy

## 2017-02-01 DIAGNOSIS — M25662 Stiffness of left knee, not elsewhere classified: Secondary | ICD-10-CM

## 2017-02-01 DIAGNOSIS — M25562 Pain in left knee: Secondary | ICD-10-CM

## 2017-02-01 DIAGNOSIS — R2242 Localized swelling, mass and lump, left lower limb: Secondary | ICD-10-CM

## 2017-02-01 DIAGNOSIS — R262 Difficulty in walking, not elsewhere classified: Secondary | ICD-10-CM

## 2017-02-01 NOTE — Therapy (Signed)
[place], [place], [address] Phone: [phone]   Fax:  [phone]  Physical Therapy Treatment  Patient Details  Name: Karl Flores MRN: [id number] Date of Birth: 03/04/1996 Referring Provider: Marlou Sa  Encounter Date: 02/01/2017      PT End of Session - 02/01/17 1111    Visit Number 20   Date for PT Re-Evaluation 02/12/17   PT Start Time 1020   PT Stop Time 1117   PT Time Calculation (min) 57 min   Equipment Utilized During Treatment Gait belt   Activity Tolerance Patient tolerated treatment well      Past Medical History:  Diagnosis Date   GERD (gastroesophageal reflux disease)    occ    Past Surgical History:  Procedure Laterality Date   KNEE ARTHROSCOPY WITH ANTERIOR CRUCIATE LIGAMENT (ACL) REPAIR WITH HAMSTRING GRAFT Left 12/04/2016   Procedure: KNEE ARTHROSCOPY WITH ANTERIOR CRUCIATE LIGAMENT (ACL) REPAIR WITH HAMSTRING GRAFT WITH POSSIBLE POSTROLATERAL CORNER RECONSTRUCTION AND LATERAL MENISCAL REPAIR VERSUS RESECTION;  Surgeon: Meredith Pel, MD;  Location: [place];  Service: Orthopedics;  Laterality: Left;   KNEE ARTHROSCOPY WITH MENISCAL REPAIR Left 12/04/2016   Procedure: KNEE ARTHROSCOPY WITH MENISCAL REPAIR;  Surgeon: Meredith Pel, MD;  Location: [place];  Service: Orthopedics;  Laterality: Left;   NO PAST SURGERIES      There were no vitals filed for this visit.      Subjective Assessment - 02/01/17 1025    Subjective No pain.  Feeling steadier up on his feet.                           [place] Adult PT Treatment/Exercise - 02/01/17 0001      Knee/Hip Exercises: Aerobic   Nustep L 4 No UE x 7 min      Knee/Hip Exercises: Standing   Forward Lunges Left;2 sets;10 reps   Forward Lunges Limitations ant, ant lat   Forward Step Up Left;2 sets;10 reps;Step Height: 8"   Step Down 2 sets;10 reps;Left   Functional Squat 2 sets;10 reps   SLS with  Vectors ant, 2x10     Knee/Hip Exercises: Supine   Bridges Limitations 5"x10 w/ hamstring   Other Supine Knee/Hip Exercises hip abd/ er [id number] b     Modalities   Modalities Cryotherapy     Vasopneumatic   Number Minutes Vasopneumatic  15 minutes   Vasopnuematic Location  Knee   Vasopneumatic Pressure Medium   Vasopneumatic Temperature  34                  PT Short Term Goals - 12/24/16 1128      PT SHORT TERM GOAL #1   Title independent with initial HEP   Status Achieved           PT Long Term Goals - 01/16/17 0926      PT LONG TERM GOAL #2   Title increase AROM of the left knee to 0-120 degrees flexion   Status Partially Met     PT LONG TERM GOAL #3   Title walk all distances without device with minimal deviation   Status On-going     PT LONG TERM GOAL #4   Title increase strength of the left knee to 4+/5   Status On-going               Plan - 02/01/17 1116  Clinical Impression Statement Did well with CKC PREs today.  Challenged with eccentric control for step down and vector reaches.  Obvious quad strength deficit noted with CKC PREs      Patient will benefit from skilled therapeutic intervention in order to improve the following deficits and impairments:     Visit Diagnosis: Acute pain of left knee  Difficulty in walking, not elsewhere classified  Localized swelling, mass and lump, left lower limb  Stiffness of left knee, not elsewhere classified     Problem List There are no active problems to display for this patient.   Olean Ree, PTA 02/01/2017, 11:46 AM  [place], [place], [address] Phone: [phone]   Fax:  [phone]  Name: AZREAL STTHOMAS MRN: [id number] Date of Birth: 1996/06/15

## 2017-02-06 ENCOUNTER — Encounter: Payer: Self-pay | Admitting: Physical Therapy

## 2017-02-06 ENCOUNTER — Encounter (INDEPENDENT_AMBULATORY_CARE_PROVIDER_SITE_OTHER): Payer: Self-pay | Admitting: Orthopedic Surgery

## 2017-02-06 ENCOUNTER — Ambulatory Visit (INDEPENDENT_AMBULATORY_CARE_PROVIDER_SITE_OTHER): Payer: Self-pay | Admitting: Orthopedic Surgery

## 2017-02-06 ENCOUNTER — Ambulatory Visit: Payer: Self-pay | Admitting: Physical Therapy

## 2017-02-06 DIAGNOSIS — R2242 Localized swelling, mass and lump, left lower limb: Secondary | ICD-10-CM

## 2017-02-06 DIAGNOSIS — M25662 Stiffness of left knee, not elsewhere classified: Secondary | ICD-10-CM

## 2017-02-06 DIAGNOSIS — M25562 Pain in left knee: Secondary | ICD-10-CM

## 2017-02-06 DIAGNOSIS — R262 Difficulty in walking, not elsewhere classified: Secondary | ICD-10-CM

## 2017-02-06 DIAGNOSIS — Z9889 Other specified postprocedural states: Secondary | ICD-10-CM

## 2017-02-06 MED ORDER — OXYCODONE HCL 5 MG PO TABS: 5.0000 mg | ORAL_TABLET | Freq: Two times a day (BID) | ORAL | 0 refills | Status: DC | PRN

## 2017-02-06 NOTE — Therapy (Signed)
[place] [address] [place], [place], [address] Phone: [phone]   Fax:  [phone]  Physical Therapy Treatment  Patient Details  Name: Karl Flores MRN: [id number] Date of Birth: 02-Dec-1995 Referring Provider: Marlou Sa  Encounter Date: 02/06/2017      PT End of Session - 02/06/17 1646    Visit Number 21   PT Start Time 0354   PT Stop Time 1700   PT Time Calculation (min) 53 min   Activity Tolerance Patient tolerated treatment well   Behavior During Therapy [facility] for tasks assessed/performed      Past Medical History:  Diagnosis Date  . GERD (gastroesophageal reflux disease)    occ    Past Surgical History:  Procedure Laterality Date  . KNEE ARTHROSCOPY WITH ANTERIOR CRUCIATE LIGAMENT (ACL) REPAIR WITH HAMSTRING GRAFT Left 12/04/2016   Procedure: KNEE ARTHROSCOPY WITH ANTERIOR CRUCIATE LIGAMENT (ACL) REPAIR WITH HAMSTRING GRAFT WITH POSSIBLE POSTROLATERAL CORNER RECONSTRUCTION AND LATERAL MENISCAL REPAIR VERSUS RESECTION;  Surgeon: Meredith Pel, MD;  Location: [place];  Service: Orthopedics;  Laterality: Left;  . KNEE ARTHROSCOPY WITH MENISCAL REPAIR Left 12/04/2016   Procedure: KNEE ARTHROSCOPY WITH MENISCAL REPAIR;  Surgeon: Meredith Pel, MD;  Location: [place];  Service: Orthopedics;  Laterality: Left;  . NO PAST SURGERIES      There were no vitals filed for this visit.      Subjective Assessment - 02/06/17 1608    Subjective "Feels good" Pt reports that he seen the MD today and he was pleased.   Currently in Pain? No/denies   Pain Score 0-No pain                         [facility] Adult PT Treatment/Exercise - 02/06/17 0001      High Level Balance   High Level Balance Comments SLS 3 way with rebound ball toss x10      Knee/Hip Exercises: Aerobic   Recumbent Bike 5 minutes L3     Knee/Hip Exercises: Machines for Strengthening   Cybex Knee Flexion LLE 15lb 2x15    Cybex  Leg Press 40lb 2x15 65-0 deg; LLE only 20lb TKE 2x15      Knee/Hip Exercises: Plyometrics   Broad Jump 2 sets;10 reps  Front/bat side/side     Knee/Hip Exercises: Standing   Forward Step Up Left;2 sets;10 reps;Step Height: 8"  explosive Green tband resistance    Other Standing Knee Exercises BOSU step up and over 2x10     Knee/Hip Exercises: Seated   Long Arc Quad 15 reps;2 sets;Left   Long Arc Quad Weight 5 lbs.   Sit to Sand 2 sets;10 reps;without UE support     Modalities   Modalities Cryotherapy     Vasopneumatic   Number Minutes Vasopneumatic  15 minutes   Vasopnuematic Location  Knee   Vasopneumatic Pressure Medium   Vasopneumatic Temperature  34                  PT Short Term Goals - 12/24/16 1128      PT SHORT TERM GOAL #1   Title independent with initial HEP   Status Achieved           PT Long Term Goals - 01/16/17 0926      PT LONG TERM GOAL #2   Title increase AROM of the left knee to 0-120 degrees flexion   Status Partially Met  PT LONG TERM GOAL #3   Title walk all distances without device with minimal deviation   Status On-going     PT LONG TERM GOAL #4   Title increase strength of the left knee to 4+/5   Status On-going               Plan - 02/06/17 1648    Clinical Impression Statement Progressed to com small bilateral jumping, pt with a little compensation and uncertainly. Pt challenged with rebound ball toss standing perpendicular to trampoline.   Rehab Potential Good   PT Frequency 3x / week   PT Duration 12 weeks   PT Treatment/Interventions ADLs/Self Care Home Management;Cryotherapy;Electrical Stimulation;Gait training;Stair training;Functional mobility training;Patient/family education;Neuromuscular re-education;Balance training;Therapeutic exercise;Therapeutic activities;Manual techniques;Vasopneumatic Device   PT Next Visit Plan Progress per prtocol      Patient will benefit from skilled therapeutic  intervention in order to improve the following deficits and impairments:  Abnormal gait, Decreased activity tolerance, Decreased balance, Decreased mobility, Decreased strength, Increased edema, Pain, Decreased endurance, Decreased range of motion, Difficulty walking  Visit Diagnosis: Acute pain of left knee  Localized swelling, mass and lump, left lower limb  Stiffness of left knee, not elsewhere classified  Difficulty in walking, not elsewhere classified     Problem List There are no active problems to display for this patient.   Scot Jun, PTA 02/06/2017, 4:51 PM  [address] [place], [place], [address] Phone: [phone]   Fax:  [phone]  Name: Karl Flores MRN: [id number] Date of Birth: 16-Jun-1996

## 2017-02-08 ENCOUNTER — Ambulatory Visit: Payer: Self-pay | Admitting: Physical Therapy

## 2017-02-10 NOTE — Progress Notes (Signed)
   Post-Op Visit Note   Patient: Karl Flores           Date of Birth: [birth year]           MRN: [id number] Visit Date: 02/06/2017 PCP: Marcine Matar, MD   Assessment & Plan:  Chief Complaint:  Chief Complaint  Patient presents with  . Left Knee - Routine Post Op   Visit Diagnoses: No diagnosis found.  Plan: Complete is a 21 year old patient who is now 2 months out left knee anterior cruciate ligament reconstruction posterior lateral corner reconstruction.  He is doing well.  He is in physical therapy to 3 times a week.  He's doing the gym plus home exercise program.  He is off medications.  On exam he has excellent range of motion.  Quad strength with about 2 cm of atrophy stable graft good rotational stability plan is to refill his pain meds and see him back in 2 months.  No running and no pivoting.  Continue with strengthening exercises.  Follow-Up Instructions: Return in about 8 weeks (around 04/03/2017).   Orders:  No orders of the defined types were placed in this encounter.  Meds ordered this encounter  Medications  . oxyCODONE (OXY IR/ROXICODONE) 5 MG immediate release tablet    Sig: Take 1 tablet (5 mg total) by mouth every 12 (twelve) hours as needed for severe pain.    Dispense:  30 tablet    Refill:  0    Imaging: No results found.  PMFS History: There are no active problems to display for this patient.  Past Medical History:  Diagnosis Date  . GERD (gastroesophageal reflux disease)    occ    No family history on file.  Past Surgical History:  Procedure Laterality Date  . KNEE ARTHROSCOPY WITH ANTERIOR CRUCIATE LIGAMENT (ACL) REPAIR WITH HAMSTRING GRAFT Left 12/04/2016   Procedure: KNEE ARTHROSCOPY WITH ANTERIOR CRUCIATE LIGAMENT (ACL) REPAIR WITH HAMSTRING GRAFT WITH POSSIBLE POSTROLATERAL CORNER RECONSTRUCTION AND LATERAL MENISCAL REPAIR VERSUS RESECTION;  Surgeon: Cammy Copa, MD;  Location: [facility];  Service: Orthopedics;  Laterality: Left;    . KNEE ARTHROSCOPY WITH MENISCAL REPAIR Left 12/04/2016   Procedure: KNEE ARTHROSCOPY WITH MENISCAL REPAIR;  Surgeon: Cammy Copa, MD;  Location: [facility];  Service: Orthopedics;  Laterality: Left;  . NO PAST SURGERIES     Social History   Occupational History  . Not on file.   Social History Main Topics  . Smoking status: Never Smoker  . Smokeless tobacco: Never Used     Comment: hookah once a week  . Alcohol use No  . Drug use: No  . Sexual activity: No

## 2017-02-11 ENCOUNTER — Ambulatory Visit: Payer: Self-pay | Admitting: Physical Therapy

## 2017-02-13 ENCOUNTER — Ambulatory Visit: Payer: Self-pay | Admitting: Physical Therapy

## 2017-02-13 ENCOUNTER — Encounter: Payer: Self-pay | Admitting: Physical Therapy

## 2017-02-13 DIAGNOSIS — M25662 Stiffness of left knee, not elsewhere classified: Secondary | ICD-10-CM

## 2017-02-13 DIAGNOSIS — R262 Difficulty in walking, not elsewhere classified: Secondary | ICD-10-CM

## 2017-02-13 DIAGNOSIS — R2242 Localized swelling, mass and lump, left lower limb: Secondary | ICD-10-CM

## 2017-02-13 DIAGNOSIS — M25562 Pain in left knee: Secondary | ICD-10-CM

## 2017-02-13 NOTE — Therapy (Signed)
[place] [address] [place], [place], [address] Phone: [phone]   Fax:  [phone]  Physical Therapy Treatment  Patient Details  Name: SKIPPER DACOSTA MRN: [id number] Date of Birth: Nov 14, 1995 Referring Provider: Marlou Sa  Encounter Date: 02/13/2017      PT End of Session - 02/13/17 1347    Visit Number 22   Date for PT Re-Evaluation 02/12/17   PT Start Time 1300   PT Stop Time 1400   PT Time Calculation (min) 60 min   Activity Tolerance Patient tolerated treatment well   Behavior During Therapy [facility] for tasks assessed/performed      Past Medical History:  Diagnosis Date  . GERD (gastroesophageal reflux disease)    occ    Past Surgical History:  Procedure Laterality Date  . KNEE ARTHROSCOPY WITH ANTERIOR CRUCIATE LIGAMENT (ACL) REPAIR WITH HAMSTRING GRAFT Left 12/04/2016   Procedure: KNEE ARTHROSCOPY WITH ANTERIOR CRUCIATE LIGAMENT (ACL) REPAIR WITH HAMSTRING GRAFT WITH POSSIBLE POSTROLATERAL CORNER RECONSTRUCTION AND LATERAL MENISCAL REPAIR VERSUS RESECTION;  Surgeon: Meredith Pel, MD;  Location: [place];  Service: Orthopedics;  Laterality: Left;  . KNEE ARTHROSCOPY WITH MENISCAL REPAIR Left 12/04/2016   Procedure: KNEE ARTHROSCOPY WITH MENISCAL REPAIR;  Surgeon: Meredith Pel, MD;  Location: [place];  Service: Orthopedics;  Laterality: Left;  . NO PAST SURGERIES      There were no vitals filed for this visit.      Subjective Assessment - 02/13/17 1301    Subjective "Awesome and feeling good right now, but my knee has been killing me" Pt reports taking a quick step Saturday to hold the door for someone and felt a sharp pain in his L knee.   Currently in Pain? No/denies   Pain Score 0-No pain                         [facility] Adult PT Treatment/Exercise - 02/13/17 0001      Knee/Hip Exercises: Aerobic   Elliptical I10 R10 x33mn frd/ 2rev   Recumbent Bike 6 minutes L2     Knee/Hip Exercises: Machines for Strengthening   Cybex Knee Extension 5lb 2x15   Cybex Knee Flexion LLE 20lb 2x15    Cybex Leg Press 20lb plyo 2x20      Knee/Hip Exercises: Standing   Other Standing Knee Exercises Lateral hopping with rotation x5 each, lateral hopping x10; fast feet 2x20; bilat 4 square 2x5; SLS LLE medial pull step over 2x10    Other Standing Knee Exercises Jogging in place, high knees, but kicks 30 sec each; SL DL 7lb 2x5      Vasopneumatic   Number Minutes Vasopneumatic  15 minutes   Vasopnuematic Location  Knee   Vasopneumatic Pressure Medium   Vasopneumatic Temperature  34                  PT Short Term Goals - 12/24/16 1128      PT SHORT TERM GOAL #1   Title independent with initial HEP   Status Achieved           PT Long Term Goals - 02/13/17 1354      PT LONG TERM GOAL #3   Title walk all distances without device with minimal deviation   Status Partially Met     PT LONG TERM GOAL #4   Title increase strength of the left knee to 4+/5   Status On-going  PT LONG TERM GOAL #5   Title go up and down stairs step over step   Status On-going               Plan - 02/13/17 1348    Clinical Impression Statement Again pt hesitate with single and bilateral jumping, with no reports of increase pain. Good strength demo ed with machine level interventions. He does reports some pulling in the back if his L knee with SL dead lifts.    Rehab Potential Good   PT Frequency 3x / week   PT Duration 12 weeks   PT Treatment/Interventions ADLs/Self Care Home Management;Cryotherapy;Electrical Stimulation;Gait training;Stair training;Functional mobility training;Patient/family education;Neuromuscular re-education;Balance training;Therapeutic exercise;Therapeutic activities;Manual techniques;Vasopneumatic Device   PT Next Visit Plan Progress per prtocol      Patient will benefit from skilled therapeutic intervention in order to improve the  following deficits and impairments:  Abnormal gait, Decreased activity tolerance, Decreased balance, Decreased mobility, Decreased strength, Increased edema, Pain, Decreased endurance, Decreased range of motion, Difficulty walking  Visit Diagnosis: Acute pain of left knee  Localized swelling, mass and lump, left lower limb  Stiffness of left knee, not elsewhere classified  Difficulty in walking, not elsewhere classified     Problem List There are no active problems to display for this patient.   Scot Jun, PTA 02/13/2017, 1:55 PM  [address] [place], [place], [address] Phone: [phone]   Fax:  [phone]  Name: VALLIE TETERS MRN: [id number] Date of Birth: 14-Jun-1996

## 2017-02-15 ENCOUNTER — Ambulatory Visit: Payer: Self-pay | Admitting: Physical Therapy

## 2017-02-19 ENCOUNTER — Ambulatory Visit: Payer: Self-pay | Attending: Orthopedic Surgery | Admitting: Physical Therapy

## 2017-02-19 ENCOUNTER — Encounter: Payer: Self-pay | Admitting: Physical Therapy

## 2017-02-19 DIAGNOSIS — M25562 Pain in left knee: Secondary | ICD-10-CM

## 2017-02-19 DIAGNOSIS — M25662 Stiffness of left knee, not elsewhere classified: Secondary | ICD-10-CM

## 2017-02-19 DIAGNOSIS — R262 Difficulty in walking, not elsewhere classified: Secondary | ICD-10-CM

## 2017-02-19 DIAGNOSIS — R2242 Localized swelling, mass and lump, left lower limb: Secondary | ICD-10-CM

## 2017-02-19 NOTE — Therapy (Signed)
[address] [place], [place], [address] Phone: [phone]   Fax:  [phone]  Physical Therapy Treatment  Patient Details  Name: Karl Flores MRN: [id number] Date of Birth: 1995/07/03 Referring Provider: Marlou Sa  Encounter Date: 02/19/2017      PT End of Session - 02/19/17 1013    Visit Number 23   PT Start Time 0928   PT Stop Time 1027   PT Time Calculation (min) 59 min   Activity Tolerance Patient tolerated treatment well   Behavior During Therapy [facility] for tasks assessed/performed      Past Medical History:  Diagnosis Date  . GERD (gastroesophageal reflux disease)    occ    Past Surgical History:  Procedure Laterality Date  . KNEE ARTHROSCOPY WITH ANTERIOR CRUCIATE LIGAMENT (ACL) REPAIR WITH HAMSTRING GRAFT Left 12/04/2016   Procedure: KNEE ARTHROSCOPY WITH ANTERIOR CRUCIATE LIGAMENT (ACL) REPAIR WITH HAMSTRING GRAFT WITH POSSIBLE POSTROLATERAL CORNER RECONSTRUCTION AND LATERAL MENISCAL REPAIR VERSUS RESECTION;  Surgeon: Meredith Pel, MD;  Location: [place];  Service: Orthopedics;  Laterality: Left;  . KNEE ARTHROSCOPY WITH MENISCAL REPAIR Left 12/04/2016   Procedure: KNEE ARTHROSCOPY WITH MENISCAL REPAIR;  Surgeon: Meredith Pel, MD;  Location: [facility];  Service: Orthopedics;  Laterality: Left;  . NO PAST SURGERIES      There were no vitals filed for this visit.      Subjective Assessment - 02/19/17 0930    Subjective Pt reports that things are going fine   Currently in Pain? No/denies   Pain Score 0-No pain            [facility] PT Assessment - 02/19/17 0001      AROM   AROM Assessment Site Knee   Right/Left Knee Left   Left Knee Extension 0   Left Knee Flexion 125     Strength   Overall Strength Comments L knee ext 4+/5, Flex 4/5                     [facility] Adult PT Treatment/Exercise - 02/19/17 0001      Ambulation/Gait   Stairs Yes   Stairs Assistance 7:  Independent   Stair Management Technique No rails;Alternating pattern   Number of Stairs 24   Height of Stairs 6   Gait Comments Some eccentric load weakness with LLE     Knee/Hip Exercises: Aerobic   Elliptical I15 R5 x51mn frd/ 3rev     Knee/Hip Exercises: Machines for Strengthening   Cybex Knee Extension 5lb 2x15   Cybex Knee Flexion LLE 20lb 2x15    Cybex Leg Press 30lb plyo 2x20      Knee/Hip Exercises: Standing   Heel Raises 2 sets;2 seconds;Left;10 reps   Other Standing Knee Exercises 3 way lateral hoping x4 each, Lateral hoping with rotation x 5 each, Lateral bouncing in place; high knees, butt kicks, jogginf in place 2x15 sec each    Other Standing Knee Exercises quick feet; Ali shuffle; Lateral quick feet      Vasopneumatic   Number Minutes Vasopneumatic  15 minutes   Vasopnuematic Location  Knee   Vasopneumatic Pressure Medium   Vasopneumatic Temperature  34                  PT Short Term Goals - 12/24/16 1128      PT SHORT TERM GOAL #1   Title independent with initial HEP   Status Achieved  PT Long Term Goals - 02/19/17 1013      PT LONG TERM GOAL #1   Title decrease pain 50%   Status Achieved     PT LONG TERM GOAL #4   Title increase strength of the left knee to 4+/5   Status Partially Met     PT LONG TERM GOAL #5   Title go up and down stairs step over step   Status Achieved               Plan - 02/19/17 1023    Clinical Impression Statement PT very fatigue during today's session, he reports only sleeping 3 hours last night. Most LTG's met, pt demos some eccentric load weakness with stair negotiation. Pt reports some pain and fatigue with all hopping interventions. PT reports that he is leaving for 3 weeks on vacation.    Rehab Potential Good   PT Frequency 3x / week   PT Duration 12 weeks   PT Treatment/Interventions ADLs/Self Care Home Management;Cryotherapy;Electrical Stimulation;Gait training;Stair  training;Functional mobility training;Patient/family education;Neuromuscular re-education;Balance training;Therapeutic exercise;Therapeutic activities;Manual techniques;Vasopneumatic Device   PT Next Visit Plan Progress per prtocol, Pt reports that he has a MD appointment when he return dn my return to therapy.      Patient will benefit from skilled therapeutic intervention in order to improve the following deficits and impairments:  Abnormal gait, Decreased activity tolerance, Decreased balance, Decreased mobility, Decreased strength, Increased edema, Pain, Decreased endurance, Decreased range of motion, Difficulty walking  Visit Diagnosis: Acute pain of left knee  Localized swelling, mass and lump, left lower limb  Stiffness of left knee, not elsewhere classified  Difficulty in walking, not elsewhere classified     Problem List There are no active problems to display for this patient.   Scot Jun, PTA 02/19/2017, 10:24 AM  [place] [address] [place], [place], [address] Phone: [phone]   Fax:  [phone]  Name: Karl Flores MRN: [id number] Date of Birth: [birth year]

## 2017-03-26 ENCOUNTER — Ambulatory Visit: Payer: Self-pay | Admitting: Physical Therapy

## 2017-03-26 ENCOUNTER — Encounter: Payer: Self-pay | Admitting: Physical Therapy

## 2017-03-26 ENCOUNTER — Ambulatory Visit: Payer: Self-pay | Attending: Orthopedic Surgery | Admitting: Physical Therapy

## 2017-03-26 DIAGNOSIS — M25662 Stiffness of left knee, not elsewhere classified: Secondary | ICD-10-CM | POA: Insufficient documentation

## 2017-03-26 DIAGNOSIS — M25562 Pain in left knee: Secondary | ICD-10-CM | POA: Insufficient documentation

## 2017-03-26 DIAGNOSIS — R262 Difficulty in walking, not elsewhere classified: Secondary | ICD-10-CM | POA: Insufficient documentation

## 2017-03-26 DIAGNOSIS — R2242 Localized swelling, mass and lump, left lower limb: Secondary | ICD-10-CM | POA: Insufficient documentation

## 2017-03-26 NOTE — Therapy (Addendum)
[place] [address] [place], [place], [address] Phone: [phone]   Fax:  [phone]  Physical Therapy Treatment  Patient Details  Name: Karl Flores MRN: [id number] Date of Birth: 08-Nov-1995 Referring Provider: Marlou Sa  Encounter Date: 03/26/2017      PT End of Session - 03/26/17 1348    Visit Number 24   PT Start Time 0174   PT Stop Time 1348   PT Time Calculation (min) 41 min   Activity Tolerance Patient tolerated treatment well   Behavior During Therapy [facility] for tasks assessed/performed      Past Medical History:  Diagnosis Date  . GERD (gastroesophageal reflux disease)    occ    Past Surgical History:  Procedure Laterality Date  . KNEE ARTHROSCOPY WITH ANTERIOR CRUCIATE LIGAMENT (ACL) REPAIR WITH HAMSTRING GRAFT Left 12/04/2016   Procedure: KNEE ARTHROSCOPY WITH ANTERIOR CRUCIATE LIGAMENT (ACL) REPAIR WITH HAMSTRING GRAFT WITH POSSIBLE POSTROLATERAL CORNER RECONSTRUCTION AND LATERAL MENISCAL REPAIR VERSUS RESECTION;  Surgeon: Meredith Pel, MD;  Location: [place];  Service: Orthopedics;  Laterality: Left;  . KNEE ARTHROSCOPY WITH MENISCAL REPAIR Left 12/04/2016   Procedure: KNEE ARTHROSCOPY WITH MENISCAL REPAIR;  Surgeon: Meredith Pel, MD;  Location: [place];  Service: Orthopedics;  Laterality: Left;  . NO PAST SURGERIES      There were no vitals filed for this visit.      Subjective Assessment - 03/26/17 1311    Subjective "Im feeling good man not pain at all"   Currently in Pain? No/denies   Pain Score 0-No pain                         [facility] Adult PT Treatment/Exercise - 03/26/17 0001      Ambulation/Gait   Gait Comments light jogging in hall, pt with a mild limp, demos eccentric load weakness with LLE     Knee/Hip Exercises: Aerobic   Elliptical I15 R5 x27mn frd/ 3rev   Tread Mill light jogging up to 555m x5 for 10 sec     Knee/Hip Exercises: Machines for  Strengthening   Cybex Knee Extension 5lb 2x15   Cybex Knee Flexion LLE 25lb 2x10    Cybex Leg Press 40lb 2x15; LLE 20lb 2x10      Knee/Hip Exercises: Standing   Forward Step Up Left;2 sets;10 reps;Hand Hold: 0;Step Height: 8"  explosive holding 8lb dumbbells    Functional Squat 15 reps;3 sets                  PT Short Term Goals - 12/24/16 1128      PT SHORT TERM GOAL #1   Title independent with initial HEP   Status Achieved           PT Long Term Goals - 02/19/17 1013      PT LONG TERM GOAL #1   Title decrease pain 50%   Status Achieved     PT LONG TERM GOAL #4   Title increase strength of the left knee to 4+/5   Status Partially Met     PT LONG TERM GOAL #5   Title go up and down stairs step over step   Status Achieved               Plan - 03/26/17 1349    Clinical Impression Statement Pt returns to therapy after a month from visiting his [place]. Pt LLE is very  weak noted with light jogging. Pt with visible shaking of RLE noted with leg extensions. LLE weak with eccentric loads causing him to land hard on LLE when running/ jogging    Rehab Potential Good   PT Frequency 3x / week   PT Duration 12 weeks   PT Treatment/Interventions ADLs/Self Care Home Management;Cryotherapy;Electrical Stimulation;Gait training;Stair training;Functional mobility training;Patient/family education;Neuromuscular re-education;Balance training;Therapeutic exercise;Therapeutic activities;Manual techniques;Vasopneumatic Device   PT Next Visit Plan 16 wks post ACL, LLE proprioception and strengthening      Patient will benefit from skilled therapeutic intervention in order to improve the following deficits and impairments:  Abnormal gait, Decreased activity tolerance, Decreased balance, Decreased mobility, Decreased strength, Increased edema, Pain, Decreased endurance, Decreased range of motion, Difficulty walking  Visit Diagnosis: Acute pain of left  knee  Difficulty in walking, not elsewhere classified  Stiffness of left knee, not elsewhere classified  Localized swelling, mass and lump, left lower limb     Problem List There are no active problems to display for this patient.  PHYSICAL THERAPY DISCHARGE SUMMARY  Visits from Start of Care: 24  Plan: Patient agrees to discharge.  Patient goals were not met. Patient is being discharged due to being pleased with the current functional level.  ?????     Scot Jun, PTA 03/26/2017, 1:52 PM  [address] [place], [place], [address] Phone: [phone]   Fax:  [phone]  Name: DOMANIQUE LUCKETT MRN: [id number] Date of Birth: 1996/05/09

## 2017-04-02 ENCOUNTER — Emergency Department (HOSPITAL_BASED_OUTPATIENT_CLINIC_OR_DEPARTMENT_OTHER): Payer: Self-pay

## 2017-04-02 ENCOUNTER — Encounter (HOSPITAL_BASED_OUTPATIENT_CLINIC_OR_DEPARTMENT_OTHER): Payer: Self-pay | Admitting: Emergency Medicine

## 2017-04-02 ENCOUNTER — Emergency Department (HOSPITAL_BASED_OUTPATIENT_CLINIC_OR_DEPARTMENT_OTHER)
Admission: EM | Admit: 2017-04-02 | Discharge: 2017-04-02 | Disposition: A | Discharge status: Home / Self Care | Payer: Self-pay | Attending: Emergency Medicine | Admitting: Emergency Medicine

## 2017-04-02 DIAGNOSIS — R0789 Other chest pain: Secondary | ICD-10-CM | POA: Insufficient documentation

## 2017-04-02 DIAGNOSIS — K219 Gastro-esophageal reflux disease without esophagitis: Secondary | ICD-10-CM | POA: Insufficient documentation

## 2017-04-02 LAB — BASIC METABOLIC PANEL
Anion gap: 9 (ref 5–15)
BUN: 20 mg/dL (ref 6–20)
CO2: 27 mmol/L (ref 22–32)
Calcium: 9.5 mg/dL (ref 8.9–10.3)
Chloride: 103 mmol/L (ref 101–111)
Creatinine, Ser: 1.12 mg/dL (ref 0.61–1.24)
GFR calc non Af Amer: >60 mL/min (ref >60)
Glucose, Bld: 113 mg/dL — ABNORMAL HIGH (ref 65–99)
POTASSIUM: 3.5 mmol/L (ref 3.5–5.1)
Sodium: 139 mmol/L (ref 135–145)

## 2017-04-02 LAB — CBC WITH DIFFERENTIAL/PLATELET
Basophils Absolute: 0 10*3/uL (ref 0.0–0.1)
Basophils Relative: 0 %
EOS PCT: 3 %
Eosinophils Absolute: 0.2 10*3/uL (ref 0.0–0.7)
HCT: 42.4 % (ref 39.0–52.0)
Hemoglobin: 13.8 g/dL (ref 13.0–17.0)
LYMPHS ABS: 3.4 10*3/uL (ref 0.7–4.0)
LYMPHS PCT: 56 %
MCH: 27.8 pg (ref 26.0–34.0)
MCHC: 32.5 g/dL (ref 30.0–36.0)
MCV: 85.5 fL (ref 78.0–100.0)
Monocytes Absolute: 0.5 10*3/uL (ref 0.1–1.0)
Monocytes Relative: 8 %
Neutro Abs: 2.1 10*3/uL (ref 1.7–7.7)
Neutrophils Relative %: 33 %
PLATELETS: 242 10*3/uL (ref 150–400)
RBC: 4.96 MIL/uL (ref 4.22–5.81)
RDW: 13.9 % (ref 11.5–15.5)
WBC: 6.2 10*3/uL (ref 4.0–10.5)

## 2017-04-02 LAB — TROPONIN I: Troponin I: <0.03 ng/mL (ref <0.03)

## 2017-04-02 NOTE — ED Triage Notes (Signed)
Pt reports chest pain while laying in bed tonight. Pt reports slight shob and nausea. Pt has hx of GERD but has not taken any medication for this.

## 2017-04-02 NOTE — ED Provider Notes (Signed)
[facility] EMERGENCY DEPARTMENT Provider Note   CSN: [id number] Arrival date & time: 04/02/17  0150     History   Chief Complaint Chief Complaint  Patient presents with  . Chest Pain    HPI Karl Flores is a 21 y.o. male.  Patient is a 21 year old male with past medical history of GERD and anterior cruciate ligament repair. He presents today for evaluation of chest discomfort and dizziness. This began earlier this evening. He states he has been having episodes similar to this for the past year. He tells me it seems to occur when he eats red meat. He denies any exertional symptoms. He denies any fevers, chills, or productive cough. This evening he began to feel dizzy and asked his mother to bring him here to be evaluated.   The history is provided by the patient.  Chest Pain   This is a new problem. Episode onset: one year. Episode frequency: intermittently. The problem has been gradually worsening. The pain is associated with walking. The pain is present in the substernal region. The pain is moderate. The quality of the pain is described as pressure-like.    Past Medical History:  Diagnosis Date  . GERD (gastroesophageal reflux disease)    occ    There are no active problems to display for this patient.   Past Surgical History:  Procedure Laterality Date  . KNEE ARTHROSCOPY WITH ANTERIOR CRUCIATE LIGAMENT (ACL) REPAIR WITH HAMSTRING GRAFT Left 12/04/2016   Procedure: KNEE ARTHROSCOPY WITH ANTERIOR CRUCIATE LIGAMENT (ACL) REPAIR WITH HAMSTRING GRAFT WITH POSSIBLE POSTROLATERAL CORNER RECONSTRUCTION AND LATERAL MENISCAL REPAIR VERSUS RESECTION;  Surgeon: Cammy Copa, MD;  Location: [facility] OR;  Service: Orthopedics;  Laterality: Left;  . KNEE ARTHROSCOPY WITH MENISCAL REPAIR Left 12/04/2016   Procedure: KNEE ARTHROSCOPY WITH MENISCAL REPAIR;  Surgeon: Cammy Copa, MD;  [facility];  Service: Orthopedics;  Laterality: Left;  . NO PAST SURGERIES          Home Medications    Prior to Admission medications   Not on File    Family History No family history on file.  Social History Social History  Substance Use Topics  . Smoking status: Never Smoker  . Smokeless tobacco: Never Used     Comment: hookah once a week  . Alcohol use No     Allergies   Penicillins   Review of Systems Review of Systems  Cardiovascular: Positive for chest pain.  All other systems reviewed and are negative.    Physical Exam Updated Vital Signs BP (!) 171/96 (BP Location: Right Arm)   Pulse 97   Temp 97.9 F (36.6 C) (Oral)   Resp 18   Ht 6' (1.829 m)   Wt 99.8 kg (220 lb)   SpO2 100%   BMI 29.84 kg/m   Physical Exam  Constitutional: He is oriented to person, place, and time. He appears well-developed and well-nourished. No distress.  HENT:  Head: Normocephalic and atraumatic.  Mouth/Throat: Oropharynx is clear and moist.  Neck: Normal range of motion. Neck supple.  Cardiovascular: Normal rate and regular rhythm.  Exam reveals no friction rub.   No murmur heard. Pulmonary/Chest: Effort normal and breath sounds normal. No respiratory distress. He has no wheezes. He has no rales.  Abdominal: Soft. Bowel sounds are normal. He exhibits no distension. There is no tenderness.  Musculoskeletal: Normal range of motion. He exhibits no edema.  Neurological: He is alert and oriented to person, place, and time. Coordination  normal.  Skin: Skin is warm and dry. He is not diaphoretic.  Nursing note and vitals reviewed.    ED Treatments / Results  Labs (all labs ordered are listed, but only abnormal results are displayed) Labs Reviewed  BASIC METABOLIC PANEL  CBC WITH DIFFERENTIAL/PLATELET  TROPONIN I    EKG  EKG Interpretation  Date/Time:  Tuesday April 02 2017 01:57:11 EDT Ventricular Rate:  97 PR Interval:    QRS Duration: 101 QT Interval:  359 QTC Calculation: 456 R Axis:   83 Text Interpretation:  Sinus rhythm  Borderline T wave abnormalities Confirmed by Geoffery Lyons ([id number]) on 04/02/2017 1:59:46 AM       Radiology No results found.  Procedures Procedures (including critical care time)  Medications Ordered in ED Medications - No data to display   Initial Impression / Assessment and Plan / ED Course  I have reviewed the triage vital signs and the nursing notes.  Pertinent labs & imaging results that were available during my care of the patient were reviewed by me and considered in my medical decision making (see chart for details).  Patient presents with complaints of chest discomfort that has been ongoing for approximately one year. His workup today reveals no obvious physical cause. He does report due to stress in his life with work and the passing of his father approximately 18 months ago. I do feel as though there is likely a stress/anxiety component. I see no indication for further workup at this time. I've advised him to follow-up with a primary Dr. In the near future for reevaluation if his symptoms persist.  Final Clinical Impressions(s) / ED Diagnoses   Final diagnoses:  None    New Prescriptions New Prescriptions   No medications on file     Geoffery Lyons, MD 04/02/17 [phone]

## 2017-04-02 NOTE — Discharge Instructions (Signed)
Follow-up with your primary Dr. If not improving in the next 2 weeks, and return to the ER symptoms significantly worsen or change.

## 2017-04-02 NOTE — ED Notes (Signed)
ED Provider at bedside. 

## 2017-04-10 ENCOUNTER — Encounter (INDEPENDENT_AMBULATORY_CARE_PROVIDER_SITE_OTHER): Payer: Self-pay | Admitting: Orthopedic Surgery

## 2017-04-10 ENCOUNTER — Ambulatory Visit (INDEPENDENT_AMBULATORY_CARE_PROVIDER_SITE_OTHER): Payer: Self-pay | Admitting: Orthopedic Surgery

## 2017-04-10 DIAGNOSIS — Z9889 Other specified postprocedural states: Secondary | ICD-10-CM

## 2017-04-10 NOTE — Progress Notes (Signed)
   Office Visit Note   Patient: Karl Flores           Date of Birth: 07-05-1995           MRN: [id number] Visit Date: 04/10/2017 Requested by: Marcine MatarJohnson, Deborah B, MD [address] [place], [place] [address] PCP: Marcine MatarJohnson, Deborah B, MD  Subjective: Chief Complaint  Patient presents with  . Left Knee - Follow-up    HPI: Patient is 4 months out left knee arthroscopy with meniscal repair in posterior lateral corner reconstruction.  Patient is doing well with not any problems.  Did well on his trip overseas.  He has been working on a home exercise program for strengthening.              ROS: All systems reviewed are negative as they relate to the chief complaint within the history of present illness.  Patient denies  fevers or chills.   Assessment & Plan: Visit Diagnoses:  1. S/P ACL repair     Plan: Impression is patient is doing well following anterior cruciate ligament reconstruction.  Has stable graft with good endpoint.  Continue quad strengthening hamstring strengthening and straight ahead running but no cutting and pivoting.  Four-month return for clinical recheck and likely release.  Follow-Up Instructions: Return in about 4 months (around 08/11/2017).   Orders:  No orders of the defined types were placed in this encounter.  No orders of the defined types were placed in this encounter.     Procedures: No procedures performed   Clinical Data: No additional findings.  Objective: Vital Signs: There were no vitals taken for this visit.  Physical Exam:   Constitutional: Patient appears well-developed HEENT:  Head: Normocephalic Eyes:EOM are normal Neck: Normal range of motion Cardiovascular: Normal rate Pulmonary/chest: Effort normal Neurologic: Patient is alert Skin: Skin is warm Psychiatric: Patient has normal mood and affect    Ortho Exam: Orthopedic exam demonstrates full active and passive range of motion of the left knee with stable graft 2 mm meter  anterior drawer with solid endpoint and symmetric external rotation at 15 of abduction and no effusion.  Does have about 2 cm of quad atrophy left versus right  Specialty Comments:  No specialty comments available.  Imaging: No results found.   PMFS History: There are no active problems to display for this patient.  Past Medical History:  Diagnosis Date  . GERD (gastroesophageal reflux disease)    occ    No family history on file.  Past Surgical History:  Procedure Laterality Date  . KNEE ARTHROSCOPY WITH ANTERIOR CRUCIATE LIGAMENT (ACL) REPAIR WITH HAMSTRING GRAFT Left 12/04/2016   Procedure: KNEE ARTHROSCOPY WITH ANTERIOR CRUCIATE LIGAMENT (ACL) REPAIR WITH HAMSTRING GRAFT WITH POSSIBLE POSTROLATERAL CORNER RECONSTRUCTION AND LATERAL MENISCAL REPAIR VERSUS RESECTION;  Surgeon: Cammy Copaean, Scott Gwendalynn Eckstrom, MD;  Location: [facility] OR;  Service: Orthopedics;  Laterality: Left;  . KNEE ARTHROSCOPY WITH MENISCAL REPAIR Left 12/04/2016   Procedure: KNEE ARTHROSCOPY WITH MENISCAL REPAIR;  Surgeon: Cammy Copaean, Scott Sande Pickert, MD;  [facility];  Service: Orthopedics;  Laterality: Left;  . NO PAST SURGERIES     Social History   Occupational History  . Not on file.   Social History Main Topics  . Smoking status: Never Smoker  . Smokeless tobacco: Never Used     Comment: hookah once a week  . Alcohol use No  . Drug use: No  . Sexual activity: No

## 2017-04-14 ENCOUNTER — Emergency Department (HOSPITAL_BASED_OUTPATIENT_CLINIC_OR_DEPARTMENT_OTHER)
Admission: EM | Admit: 2017-04-14 | Discharge: 2017-04-14 | Disposition: A | Discharge status: Home / Self Care | Payer: Self-pay | Attending: Physician Assistant | Admitting: Physician Assistant

## 2017-04-14 ENCOUNTER — Emergency Department (HOSPITAL_BASED_OUTPATIENT_CLINIC_OR_DEPARTMENT_OTHER): Payer: Self-pay

## 2017-04-14 ENCOUNTER — Encounter (HOSPITAL_BASED_OUTPATIENT_CLINIC_OR_DEPARTMENT_OTHER): Payer: Self-pay | Admitting: *Deleted

## 2017-04-14 DIAGNOSIS — F410 Panic disorder [episodic paroxysmal anxiety] without agoraphobia: Secondary | ICD-10-CM

## 2017-04-14 DIAGNOSIS — F419 Anxiety disorder, unspecified: Secondary | ICD-10-CM | POA: Insufficient documentation

## 2017-04-14 LAB — CBC WITH DIFFERENTIAL/PLATELET
Basophils Absolute: 0 10*3/uL (ref 0.0–0.1)
Basophils Relative: 0 %
EOS PCT: 2 %
Eosinophils Absolute: 0.1 10*3/uL (ref 0.0–0.7)
HCT: 39.5 % (ref 39.0–52.0)
Hemoglobin: 13 g/dL (ref 13.0–17.0)
LYMPHS ABS: 1.7 10*3/uL (ref 0.7–4.0)
LYMPHS PCT: 44 %
MCH: 28.2 pg (ref 26.0–34.0)
MCHC: 32.9 g/dL (ref 30.0–36.0)
MCV: 85.7 fL (ref 78.0–100.0)
MONOS PCT: 10 %
Monocytes Absolute: 0.4 10*3/uL (ref 0.1–1.0)
Neutro Abs: 1.7 10*3/uL (ref 1.7–7.7)
Neutrophils Relative %: 44 %
PLATELETS: 186 10*3/uL (ref 150–400)
RBC: 4.61 MIL/uL (ref 4.22–5.81)
RDW: 13.5 % (ref 11.5–15.5)
WBC: 3.8 10*3/uL — AB (ref 4.0–10.5)

## 2017-04-14 LAB — COMPREHENSIVE METABOLIC PANEL
ALBUMIN: 4.3 g/dL (ref 3.5–5.0)
ALT: 16 U/L — ABNORMAL LOW (ref 17–63)
AST: 17 U/L (ref 15–41)
Alkaline Phosphatase: 42 U/L (ref 38–126)
Anion gap: 7 (ref 5–15)
BUN: 16 mg/dL (ref 6–20)
CHLORIDE: 104 mmol/L (ref 101–111)
CO2: 27 mmol/L (ref 22–32)
CREATININE: 0.76 mg/dL (ref 0.61–1.24)
Calcium: 9 mg/dL (ref 8.9–10.3)
GFR calc Af Amer: >60 mL/min (ref >60)
Glucose, Bld: 99 mg/dL (ref 65–99)
POTASSIUM: 3.9 mmol/L (ref 3.5–5.1)
SODIUM: 138 mmol/L (ref 135–145)
Total Bilirubin: 0.3 mg/dL (ref 0.3–1.2)
Total Protein: 6.8 g/dL (ref 6.5–8.1)

## 2017-04-14 LAB — TROPONIN I: Troponin I: <0.03 ng/mL (ref <0.03)

## 2017-04-14 LAB — D-DIMER, QUANTITATIVE (NOT AT ARMC)

## 2017-04-14 MED ORDER — LORAZEPAM 1 MG PO TABS: 0.5000 mg | ORAL_TABLET | Freq: Three times a day (TID) | ORAL | 0 refills | Status: DC | PRN

## 2017-04-14 NOTE — ED Triage Notes (Addendum)
Pt c/o general h/a that started around 2am. Describes pain as a throbbing type pain that comes and goes. Denies any n/v or blurred vision. Also states difficult to take a deep breath. States he had this same issue 10 days ago and was seen here for same symptoms. States h/a has improved. resp even and unlabored at present. Pt states he traveled back from [place] 1 month ago. States he is breathing easier when sitting forward. Pt does not have a ride home.

## 2017-04-14 NOTE — ED Notes (Signed)
md with pt

## 2017-04-14 NOTE — ED Notes (Signed)
D/C instructions given at length to pt. Voiced understanding. States he feels better. Denies any pain or sob.

## 2017-04-14 NOTE — Discharge Instructions (Signed)
As we discussed you need to follow-up with her primary care physician.  You likely need to be on medication to help with your anxiety.  Phone number for the [facility] located on the second floor this building is [phone] we will also give you the cardiology's number as requested.  To find a primary care or specialty doctor please call [phone] or [phone] to access "[facility] Find a Doctor Service."  You may also go on the [facility] website at [email or web address]  There are also multiple [facility] practices throughout the [place] that are frequently accepting new patients. You may find a clinic that is close to your home and contact them.  [facility] -  [address] [place] [phone]  [facility] in [place] (also locations in [place] and [place]) -  [address] [place] [address] [phone]  [facility] -  [address]  [place] [address] [phone]

## 2017-04-14 NOTE — ED Provider Notes (Signed)
[facility] EMERGENCY DEPARTMENT Provider Note   CSN: [id number] Arrival date & time: 04/14/17  0234     History   Chief Complaint No chief complaint on file.   HPI Karl Flores is a 21 y.o. male.  HPI   Is a 21 year old male presenting with shortness of breath and chest pain.  Patient had the same presentation about 10 days ago. He reports pain over the last several years.   Patient had negative troponin and labs at that time.  Patient reports that he had a recent trip to [place], 3 weeks ago with a long plane flight.  However patient really does seem to be anxious.  He reports similar feelings that he had tonight when he entered 1 of the small planes.  He is concerned because his father died from an MI 18 months ago.  He is asking whether he there are any tests to confirm that he does not have any blockages in his heart.  Patient feels very overwhelmed and anxious.  He feels like he does not cannot control his symptoms and he constantly thinks about them.  Past Medical History:  Diagnosis Date  . GERD (gastroesophageal reflux disease)    occ    There are no active problems to display for this patient.   Past Surgical History:  Procedure Laterality Date  . KNEE ARTHROSCOPY WITH ANTERIOR CRUCIATE LIGAMENT (ACL) REPAIR WITH HAMSTRING GRAFT Left 12/04/2016   Procedure: KNEE ARTHROSCOPY WITH ANTERIOR CRUCIATE LIGAMENT (ACL) REPAIR WITH HAMSTRING GRAFT WITH POSSIBLE POSTROLATERAL CORNER RECONSTRUCTION AND LATERAL MENISCAL REPAIR VERSUS RESECTION;  Surgeon: Cammy Copa, MD;  Location: [facility] OR;  Service: Orthopedics;  Laterality: Left;  . KNEE ARTHROSCOPY WITH MENISCAL REPAIR Left 12/04/2016   Procedure: KNEE ARTHROSCOPY WITH MENISCAL REPAIR;  Surgeon: Cammy Copa, MD;  Location: [facility] OR;  Service: Orthopedics;  Laterality: Left;  . NO PAST SURGERIES         Home Medications    Prior to Admission medications   Not on File    Family History No family  history on file.  Social History Social History  Substance Use Topics  . Smoking status: Never Smoker  . Smokeless tobacco: Never Used     Comment: hookah once a week  . Alcohol use No     Allergies   Penicillins   Review of Systems Review of Systems  Constitutional: Negative for activity change and fever.  Respiratory: Positive for shortness of breath.   Cardiovascular: Positive for chest pain and palpitations.  Gastrointestinal: Negative for abdominal pain.  Psychiatric/Behavioral: Positive for agitation and sleep disturbance. Negative for suicidal ideas. The patient is nervous/anxious.      Physical Exam Updated Vital Signs BP 139/76   Pulse 81   Temp 98.3 F (36.8 C)   Resp (!) 22   Ht 6' (1.829 m)   Wt 98.9 kg (218 lb)   SpO2 98%   BMI 29.57 kg/m   Physical Exam  Constitutional: He is oriented to person, place, and time. He appears well-nourished.  HENT:  Head: Normocephalic.  Eyes: Conjunctivae are normal.  Cardiovascular: Normal rate and regular rhythm.   Pulmonary/Chest: Effort normal and breath sounds normal. No respiratory distress. He has no wheezes.  Abdominal: Soft. Bowel sounds are normal. He exhibits no distension. There is no tenderness.  Neurological: He is oriented to person, place, and time.  Skin: Skin is warm and dry. He is not diaphoretic.  Psychiatric:  anxious  ED Treatments / Results  Labs (all labs ordered are listed, but only abnormal results are displayed) Labs Reviewed  D-DIMER, QUANTITATIVE (NOT AT [facility])  COMPREHENSIVE METABOLIC PANEL  CBC WITH DIFFERENTIAL/PLATELET  TROPONIN I    EKG  EKG Interpretation  Date/Time:  Sunday April 14 2017 05:25:30 EDT Ventricular Rate:  69 PR Interval:    QRS Duration: 95 QT Interval:  423 QTC Calculation: 454 R Axis:   65 Text Interpretation:  Sinus rhythm RSR' in V1 or V2, probably normal variant ST elev, probable normal early repol pattern no evidence of ischemia.   Confirmed by Bary CastillaMackuen, Courteney ([id number]) on 04/14/2017 5:44:57 AM       Radiology No results found.  Procedures Procedures (including critical care time)  Medications Ordered in ED Medications - No data to display   Initial Impression / Assessment and Plan / ED Course  I have reviewed the triage vital signs and the nursing notes.  Pertinent labs & imaging results that were available during my care of the patient were reviewed by me and considered in my medical decision making (see chart for details).     Is a 21 year old male presenting with shortness of breath and chest pain.  Patient had the same presentation about 10 days ago.  Patient had negative troponin and labs at that time.  Patient reports that he had a recent trip to [place], 3 weeks ago with a long plane flight.  However patient really does seem to be anxious.  He reports similar feelings that he had tonight when he entered 1 of the small planes.  He is concerned because his father died from an MI 18 months ago.  He is asking whether he there are any tests to confirm that he does not have any blockages in his heart.  No htn, hld or DM. Patient feels very overwhelmed and anxious.  He feels like he does not cannot control his symptoms and he constantly thinks about them.  4:24 AM Will send d-dimer.  Otherwise I think this is likely anxiety based.  We will have him follow-up with primary care upstairs to get started on an SSRI.  Denies SI.  6:32 AM EKG shows T wave inversion V1 V2.  This is new from EKG last week, discussed with cardiology.  They do not think this represents ischemia.  It is nonspecific.  Given the patient is having his episodic pain associated with shortness of breath and panicky feelings I think this like it likely represents anxiety. BER on ekg, not likely to reprresent pericarditis given episodic nature for the last 2-3 years per patient.   Will give him a small prescription in case he has anxiety attacks  again.  We had a long discussion about how he needs to follow-up with her primary care provider for SSRI.  Final Clinical Impressions(s) / ED Diagnoses   Final diagnoses:  None    New Prescriptions New Prescriptions   No medications on file     Abelino DerrickMackuen, Courteney Lyn, MD 04/14/17 [phone]

## 2017-08-14 ENCOUNTER — Ambulatory Visit (INDEPENDENT_AMBULATORY_CARE_PROVIDER_SITE_OTHER): Payer: Self-pay | Admitting: Orthopedic Surgery

## 2017-08-19 ENCOUNTER — Ambulatory Visit: Payer: Medicaid Other | Attending: Internal Medicine

## 2017-09-05 ENCOUNTER — Ambulatory Visit: Payer: Self-pay | Attending: Internal Medicine | Admitting: Physician Assistant

## 2017-09-05 VITALS — BP 122/75 | HR 64 | Temp 98.2°F | Resp 16 | Ht 72.0 in | Wt 235.8 lb

## 2017-09-05 DIAGNOSIS — Z880 Allergy status to penicillin: Secondary | ICD-10-CM | POA: Insufficient documentation

## 2017-09-05 DIAGNOSIS — K0889 Other specified disorders of teeth and supporting structures: Secondary | ICD-10-CM

## 2017-09-05 NOTE — Progress Notes (Signed)
Patient ID: Karl Flores, male   DOB: 02/07/1996, 22 y.o.   MRN: [id number]   Karl Flores, is a 22 y.o. male  WUJ:[id number]  [id number]  DOB - Jun 27, 1995  Subjective:  Chief Complaint and HPI: Karl Flores is a 22 y.o. male here today for L upper wisdom tooth pain.  Pain has been intermittent over the years but worse lately.  No f/c.  He has orange card.  He has been using CBD oil for pain  ROS:   Constitutional:  No f/c, No night sweats, No unexplained weight loss. EENT:  No vision changes, No blurry vision, No hearing changes. No other mouth, throat, or ear problems.  Respiratory: No cough, No SOB Cardiac: No CP, no palpitations GI:  No abd pain, No N/V/D. GU: No Urinary s/sx Musculoskeletal: No joint pain Neuro: No headache, no dizziness, no motor weakness.  Skin: No rash Endocrine:  No polydipsia. No polyuria.  Psych: Denies SI/HI  No problems updated.  ALLERGIES: Allergies  Allergen Reactions  . Penicillins Other (See Comments)    Has patient had a PCN reaction causing immediate rash, facial/tongue/throat swelling, SOB or lightheadedness with hypotension: Unknown Has patient had a PCN reaction causing severe rash involving mucus membranes or skin necrosis: Unknown Has patient had a PCN reaction that required hospitalization: Unknown Has patient had a PCN reaction occurring within the last 10 years: No Unknown Childhood reaction  If all of the above answers are "NO", then may proceed with Cephalosporin use.      PAST MEDICAL HISTORY: Past Medical History:  Diagnosis Date  . GERD (gastroesophageal reflux disease)    occ    MEDICATIONS AT HOME: Prior to Admission medications   Medication Sig Start Date End Date Taking? Authorizing Provider  LORazepam (ATIVAN) 1 MG tablet Take 0.5 tablets (0.5 mg total) by mouth every 8 (eight) hours as needed for anxiety. 04/14/17   Mackuen, Courteney Lyn, MD     Objective:  EXAM:   Vitals:   09/05/17 1420  BP: 122/75    Pulse: 64  Resp: 16  Temp: 98.2 F (36.8 C)  TempSrc: Oral  SpO2: 98%  Weight: 235 lb 12.8 oz (107 kg)  Height: 6' (1.829 m)    General appearance : A&OX3. NAD. Non-toxic-appearing HEENT: Atraumatic and Normocephalic.  PERRLA. EOM intact.   Mouth-MMM, post pharynx WNL w/o erythema, No PND.  Tooth #15 causing pain.  Lower 2 wisdom teeth partially erupted through skin.  Top 2 do not appear to have broken through the skin.  There is no sign of abscess.   Neck: supple, no JVD. No cervical lymphadenopathy. No thyromegaly Chest/Lungs:  Breathing-non-labored, Good air entry bilaterally, breath sounds normal without rales, rhonchi, or wheezing  CVS: S1 S2 regular, no murmurs, gallops, rubs  Extremities: Bilateral Lower Ext shows no edema, both legs are warm to touch with = pulse throughout Neurology:  CN II-XII grossly intact, Non focal.   Psych:  TP linear. J/I WNL. Normal speech. Appropriate eye contact and affect.  Skin:  No Rash  Data Review No results found for: HGBA1C   Assessment & Plan   1. Tooth pain Likely needs wisdom teeth extraction-dental resources provided and referral placed.  Advised ibuprofen and salt water gargles for pain - Ambulatory referral to Dentistry  Patient have been counseled extensively about nutrition and exercise  Return in about 3 months (around 12/06/2017) for assign PCP; fasting bloodwork.  The patient was given clear instructions to go to ER or return  to medical center if symptoms don't improve, worsen or new problems develop. The patient verbalized understanding. The patient was told to call to get lab results if they haven't heard anything in the next week.     Georgian CoAngela Jae Bruck, PA-C [facility] [place], [place] [phone]   09/05/2017, 2:42 PM

## 2017-09-05 NOTE — Progress Notes (Signed)
Pt. Is here for a dental referral for his wisdom tooth removal. Pt. Stated his top left tooth is bothering him.

## 2017-12-06 ENCOUNTER — Ambulatory Visit: Payer: Self-pay | Attending: Internal Medicine | Admitting: Internal Medicine

## 2017-12-06 ENCOUNTER — Encounter: Payer: Self-pay | Admitting: Internal Medicine

## 2017-12-06 VITALS — BP 125/85 | HR 56 | Temp 98.3°F | Resp 16 | Wt 224.6 lb

## 2017-12-06 DIAGNOSIS — R0789 Other chest pain: Secondary | ICD-10-CM | POA: Insufficient documentation

## 2017-12-06 DIAGNOSIS — Z7689 Persons encountering health services in other specified circumstances: Secondary | ICD-10-CM | POA: Insufficient documentation

## 2017-12-06 DIAGNOSIS — R030 Elevated blood-pressure reading, without diagnosis of hypertension: Secondary | ICD-10-CM | POA: Insufficient documentation

## 2017-12-06 DIAGNOSIS — Z880 Allergy status to penicillin: Secondary | ICD-10-CM | POA: Insufficient documentation

## 2017-12-06 DIAGNOSIS — F419 Anxiety disorder, unspecified: Secondary | ICD-10-CM | POA: Insufficient documentation

## 2017-12-06 DIAGNOSIS — Z79899 Other long term (current) drug therapy: Secondary | ICD-10-CM | POA: Insufficient documentation

## 2017-12-06 DIAGNOSIS — Z114 Encounter for screening for human immunodeficiency virus [HIV]: Secondary | ICD-10-CM | POA: Insufficient documentation

## 2017-12-06 DIAGNOSIS — E669 Obesity, unspecified: Secondary | ICD-10-CM | POA: Insufficient documentation

## 2017-12-06 DIAGNOSIS — Z6830 Body mass index (BMI) 30.0-30.9, adult: Secondary | ICD-10-CM | POA: Insufficient documentation

## 2017-12-06 DIAGNOSIS — Z23 Encounter for immunization: Secondary | ICD-10-CM | POA: Insufficient documentation

## 2017-12-06 MED ORDER — TETANUS-DIPHTH-ACELL PERTUSSIS 5-2.5-18.5 LF-MCG/0.5 IM SUSP: 0.5000 mL | Freq: Once | INTRAMUSCULAR | 0 refills | Status: AC

## 2017-12-06 NOTE — Patient Instructions (Addendum)
Follow a Healthy Eating Plan - You can do it! Limit sugary drinks.  Avoid sodas, sweet tea, sport or energy drinks, or fruit drinks.  Drink water, lo-fat milk, or diet drinks. Limit snack foods.   Cut back on candy, cake, cookies, chips, ice cream.  These are a special treat, only in small amounts. Eat plenty of vegetables.  Especially dark green, red, and orange vegetables. Aim for at least 3 servings a day. More is better! Include fruit in your daily diet.  Whole fruit is much healthier than fruit juice! Limit "white" bread, "white" pasta, "white" rice.   Choose "100% whole grain" products, brown or wild rice. Avoid fatty meats. Try "Meatless Monday" and choose eggs or beans one day a week.  When eating meat, choose lean meats like chicken, turkey, and fish.  Grill, broil, or bake meats instead of frying, and eat poultry without the skin. Eat less salt.  Avoid frozen pizzas, frozen dinners and salty foods.  Use seasonings other than salt in cooking.  This can help blood pressure and keep you from swelling Beer, wine and liquor have calories.  If you can safely drink alcohol, limit to 1 drink per day for women, 2 drinks for men   Td Vaccine (Tetanus and Diphtheria): What You Need to Know 1. Why get vaccinated? Tetanus  and diphtheria are very serious diseases. They are rare in the [place] today, but people who do become infected often have severe complications. Td vaccine is used to protect adolescents and adults from both of these diseases. Both tetanus and diphtheria are infections caused by bacteria. Diphtheria spreads from person to person through coughing or sneezing. Tetanus-causing bacteria enter the body through cuts, scratches, or wounds. TETANUS (lockjaw) causes painful muscle tightening and stiffness, usually all over the body.  It can lead to tightening of muscles in the head and neck so you can't open your mouth, swallow, or sometimes even breathe. Tetanus kills about 1 out of  every 10 people who are infected even after receiving the best medical care.  DIPHTHERIA can cause a thick coating to form in the back of the throat.  It can lead to breathing problems, paralysis, heart failure, and death.  Before vaccines, as many as 200,000 cases of diphtheria and hundreds of cases of tetanus were reported in the [place] each year. Since vaccination began, reports of cases for both diseases have dropped by about 99%. 2. Td vaccine Td vaccine can protect adolescents and adults from tetanus and diphtheria. Td is usually given as a booster dose every 10 years but it can also be given earlier after a severe and dirty wound or burn. Another vaccine, called Tdap, which protects against pertussis in addition to tetanus and diphtheria, is sometimes recommended instead of Td vaccine. Your doctor or the person giving you the vaccine can give you more information. Td may safely be given at the same time as other vaccines. 3. Some people should not get this vaccine  A person who has ever had a life-threatening allergic reaction after a previous dose of any tetanus or diphtheria containing vaccine, OR has a severe allergy to any part of this vaccine, should not get Td vaccine. Tell the person giving the vaccine about any severe allergies.  Talk to your doctor if you: ? had severe pain or swelling after any vaccine containing diphtheria or tetanus, ? ever had a condition called Guillain Barre Syndrome (GBS), ? aren't feeling well on the day the shot   is scheduled. 4. What are the risks from Td vaccine? With any medicine, including vaccines, there is a chance of side effects. These are usually mild and go away on their own. Serious reactions are also possible but are rare. Most people who get Td vaccine do not have any problems with it. Mild problems following Td vaccine: (Did not interfere with activities)  Pain where the shot was given (about 8 people in 10)  Redness or  swelling where the shot was given (about 1 person in 4)  Mild fever (rare)  Headache (about 1 person in 4)  Tiredness (about 1 person in 4)  Moderate problems following Td vaccine: (Interfered with activities, but did not require medical attention)  Fever over 102F (rare)  Severe problems following Td vaccine: (Unable to perform usual activities; required medical attention)  Swelling, severe pain, bleeding and/or redness in the arm where the shot was given (rare).  Problems that could happen after any vaccine:  People sometimes faint after a medical procedure, including vaccination. Sitting or lying down for about 15 minutes can help prevent fainting, and injuries caused by a fall. Tell your doctor if you feel dizzy, or have vision changes or ringing in the ears.  Some people get severe pain in the shoulder and have difficulty moving the arm where a shot was given. This happens very rarely.  Any medication can cause a severe allergic reaction. Such reactions from a vaccine are very rare, estimated at fewer than 1 in a million doses, and would happen within a few minutes to a few hours after the vaccination. As with any medicine, there is a very remote chance of a vaccine causing a serious injury or death. The safety of vaccines is always being monitored. For more information, visit: www.cdc.gov/vaccinesafety/ 5. What if there is a serious reaction? What should I look for? Look for anything that concerns you, such as signs of a severe allergic reaction, very high fever, or unusual behavior. Signs of a severe allergic reaction can include hives, swelling of the face and throat, difficulty breathing, a fast heartbeat, dizziness, and weakness. These would usually start a few minutes to a few hours after the vaccination. What should I do?  If you think it is a severe allergic reaction or other emergency that can't wait, call 9-1-1 or get the person to the nearest hospital. Otherwise,  call your doctor.  Afterward, the reaction should be reported to the Vaccine Adverse Event Reporting System (VAERS). Your doctor might file this report, or you can do it yourself through the VAERS web site at www.vaers.hhs.gov, or by calling 1-800-822-7967. ? VAERS does not give medical advice. 6. The National Vaccine Injury Compensation Program The National Vaccine Injury Compensation Program (VICP) is a federal program that was created to compensate people who may have been injured by certain vaccines. Persons who believe they may have been injured by a vaccine can learn about the program and about filing a claim by calling 1-800-338-2382 or visiting the VICP website at www.hrsa.gov/vaccinecompensation. There is a time limit to file a claim for compensation. 7. How can I learn more?  Ask your doctor. He or she can give you the vaccine package insert or suggest other sources of information.  Call your local or state health department.  Contact the Centers for Disease Control and Prevention (CDC): ? Call 1-800-232-4636 (1-800-CDC-INFO) ? Visit CDC's website at www.cdc.gov/vaccines CDC Td Vaccine VIS (09/27/15) This information is not intended to replace advice given to you   by your health care provider. Make sure you discuss any questions you have with your health care provider. Document Released: 04/01/2006 Document Revised: 02/23/2016 Document Reviewed: 02/23/2016 Elsevier Interactive Patient Education  2017 Elsevier Inc.  

## 2017-12-06 NOTE — Progress Notes (Signed)
Patient ID: Karl Flores, male    DOB: July 28, 1995  MRN: [id number]  CC: Establish Care and blood work   Subjective: Karl Flores is a 22 y.o. male who presents for routine care.  I last saw him 10/2016. His concerns today include:   C/o pains in chest if he eats red meat or greasy foods x 1 yr.  Starts 15 mins after eating these foods.  -No stomach pain Fhx past away 2 yrs ago with cardiac arrest in his 84s. Would like to get lipid profile -exercise 4 xa wk - walk for 1 hr and does some wgh training.  Loss 11 lbs since 08/2017.  -smoked for 4 yrs, quit 03/2017.  No street drug use and does not drink.   Seen in ER x 2 03/2017 for anxiety disorder.  Used CBD oil for several wks and symptoms resolved.    Current Outpatient Medications on File Prior to Visit  Medication Sig Dispense Refill  . LORazepam (ATIVAN) 1 MG tablet Take 0.5 tablets (0.5 mg total) by mouth every 8 (eight) hours as needed for anxiety. 5 tablet 0   No current facility-administered medications on file prior to visit.     Allergies  Allergen Reactions  . Penicillins Other (See Comments)    Has patient had a PCN reaction causing immediate rash, facial/tongue/throat swelling, SOB or lightheadedness with hypotension: Unknown Has patient had a PCN reaction causing severe rash involving mucus membranes or skin necrosis: Unknown Has patient had a PCN reaction that required hospitalization: Unknown Has patient had a PCN reaction occurring within the last 10 years: No Unknown Childhood reaction  If all of the above answers are "NO", then may proceed with Cephalosporin use.      Social History   Socioeconomic History  . Marital status: Single    Spouse name: Not on file  . Number of children: Not on file  . Years of education: Not on file  . Highest education level: Not on file  Occupational History  . Not on file  Social Needs  . Financial resource strain: Not on file  . Food insecurity:    Worry: Not on  file    Inability: Not on file  . Transportation needs:    Medical: Not on file    Non-medical: Not on file  Tobacco Use  . Smoking status: Never Smoker  . Smokeless tobacco: Never Used  . Tobacco comment: hookah once a week  Substance and Sexual Activity  . Alcohol use: No  . Drug use: No  . Sexual activity: Never    Birth control/protection: None  Lifestyle  . Physical activity:    Days per week: Not on file    Minutes per session: Not on file  . Stress: Not on file  Relationships  . Social connections:    Talks on phone: Not on file    Gets together: Not on file    Attends religious service: Not on file    Active member of club or organization: Not on file    Attends meetings of clubs or organizations: Not on file    Relationship status: Not on file  . Intimate partner violence:    Fear of current or ex partner: Not on file    Emotionally abused: Not on file    Physically abused: Not on file    Forced sexual activity: Not on file  Other Topics Concern  . Not on file  Social History Narrative  .  Not on file    No family history on file.  Past Surgical History:  Procedure Laterality Date  . KNEE ARTHROSCOPY WITH ANTERIOR CRUCIATE LIGAMENT (ACL) REPAIR WITH HAMSTRING GRAFT Left 12/04/2016   Procedure: KNEE ARTHROSCOPY WITH ANTERIOR CRUCIATE LIGAMENT (ACL) REPAIR WITH HAMSTRING GRAFT WITH POSSIBLE POSTROLATERAL CORNER RECONSTRUCTION AND LATERAL MENISCAL REPAIR VERSUS RESECTION;  Surgeon: Cammy Copaean, Scott Gregory, MD;  Location: [facility] OR;  Service: Orthopedics;  Laterality: Left;  . KNEE ARTHROSCOPY WITH MENISCAL REPAIR Left 12/04/2016   Procedure: KNEE ARTHROSCOPY WITH MENISCAL REPAIR;  Surgeon: Cammy Copaean, Scott Gregory, MD;  [facility];  Service: Orthopedics;  Laterality: Left;  . NO PAST SURGERIES      ROS: Review of Systems Negative except as stated above PHYSICAL EXAM: BP 125/85   Pulse (!) 56   Temp 98.3 F (36.8 C) (Oral)   Resp 16   Wt 224 lb 9.6 oz (101.9 kg)    SpO2 100%   BMI 30.46 kg/m   Wt Readings from Last 3 Encounters:  12/06/17 224 lb 9.6 oz (101.9 kg)  09/05/17 235 lb 12.8 oz (107 kg)  04/14/17 218 lb (98.9 kg)   BP124/86  Physical Exam  General appearance - alert, well appearing, young male and in no distress Mental status - alert, oriented to person, place, and time, normal mood, behavior, speech, dress, motor activity, and thought processes Eyes - pupils equal and reactive, extraocular eye movements intact Nose - normal and patent, no erythema, discharge or polyps Mouth - mucous membranes moist, pharynx normal without lesions Neck - supple, no significant adenopathy Chest - clear to auscultation, no wheezes, rales or rhonchi, symmetric air entry Heart - normal rate, regular rhythm, normal S1, S2, no murmurs, rubs, clicks or gallops Extremities - peripheral pulses normal, no pedal edema, no clubbing or cyanosis   ASSESSMENT AND PLAN: 1. Atypical chest pain Sounds GI related.  He has stopped eating red meat.  2. Class 1 obesity without serious comorbidity with body mass index (BMI) of 30.0 to 30.9 in adult, unspecified obesity type Commended on regular exercise, dietary changes and wgh loss so far.  Encouraged to keep up the good work. - Lipid panel  3. Screening for HIV (human immunodeficiency virus) - HIV antibody  4. Elevated blood pressure reading DASH diet discussed.  Will recheck BP on any future visit  5. Need for Tdap vaccination - Tdap vaccine greater than or equal to 7yo IM  Patient was given the opportunity to ask questions.  Patient verbalized understanding of the plan and was able to repeat key elements of the plan.   No orders of the defined types were placed in this encounter.    Requested Prescriptions    No prescriptions requested or ordered in this encounter    No follow-ups on file.  Jonah Blueeborah Johnson, MD, FACP

## 2017-12-07 LAB — LIPID PANEL
CHOLESTEROL TOTAL: 153 mg/dL (ref 100–199)
Chol/HDL Ratio: 3.6 ratio (ref 0.0–5.0)
HDL: 43 mg/dL (ref >39)
LDL Calculated: 99 mg/dL (ref 0–99)
Triglycerides: 57 mg/dL (ref 0–149)
VLDL Cholesterol Cal: 11 mg/dL (ref 5–40)

## 2017-12-07 LAB — HIV ANTIBODY (ROUTINE TESTING W REFLEX): HIV SCREEN 4TH GENERATION: NONREACTIVE

## 2017-12-11 ENCOUNTER — Telehealth: Payer: Self-pay

## 2017-12-11 NOTE — Telephone Encounter (Signed)
Contacted pt to go over lab results pt didn't answer left a detailed vm informing pt of results and if he has any questions or concerns to give me a call  If pt calls back please give results: HIV test neg. Cholesterol test nl

## 2017-12-11 NOTE — Telephone Encounter (Signed)
-----   Message from Marcine Matareborah B Johnson, MD sent at 12/08/2017  7:40 AM EDT ----- Let pt know that HIV test neg. Cholesterol test nl.

## 2018-06-24 IMAGING — CR DG CHEST 2V
2 series · 2 of 2 positions shown · non-contrast
Comparison: Chest radiograph April 02, 2017

CLINICAL DATA: Headache, difficulty breathing. Recent travel to
[place].

EXAM:
CHEST  2 VIEW

[w chest pa]
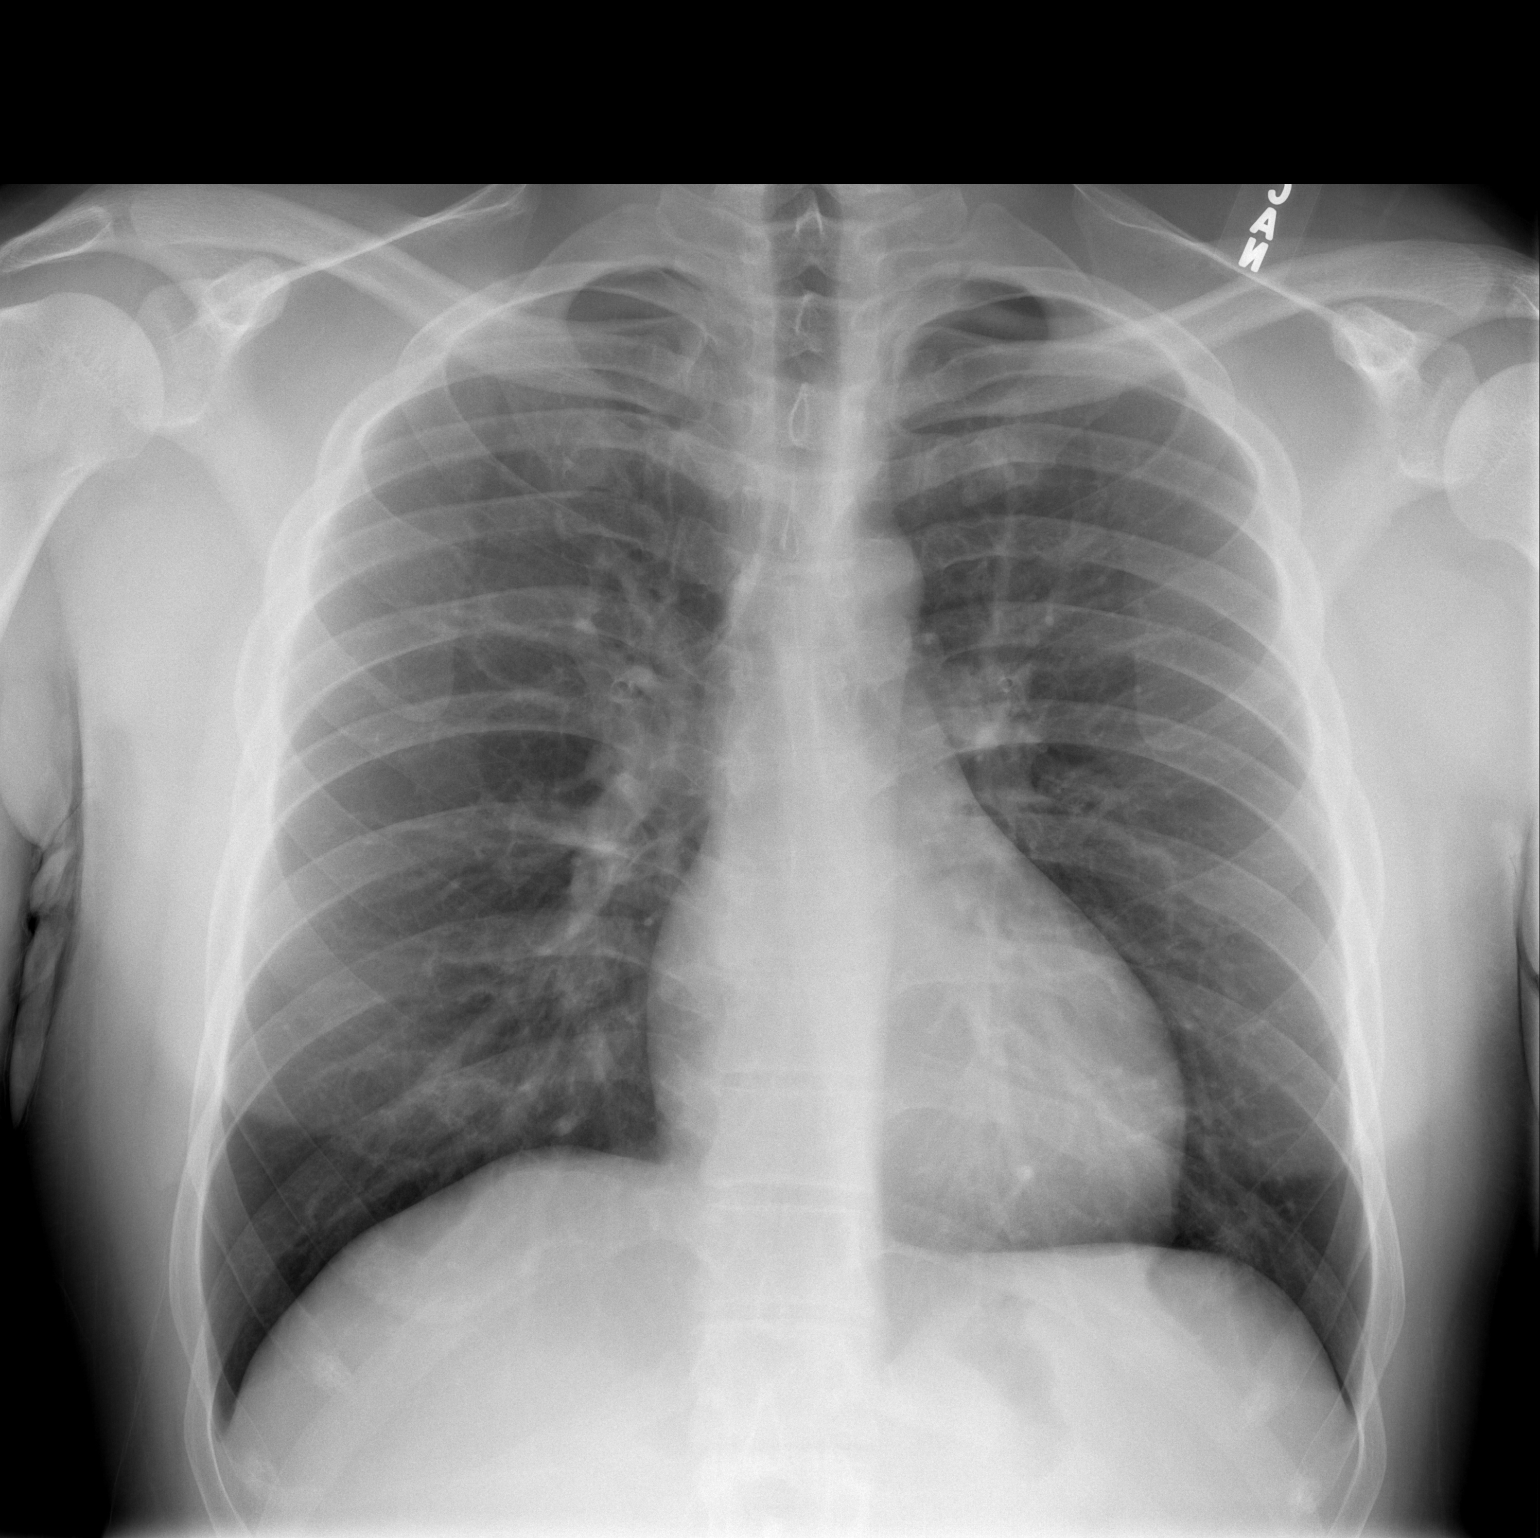

[w chest lat]
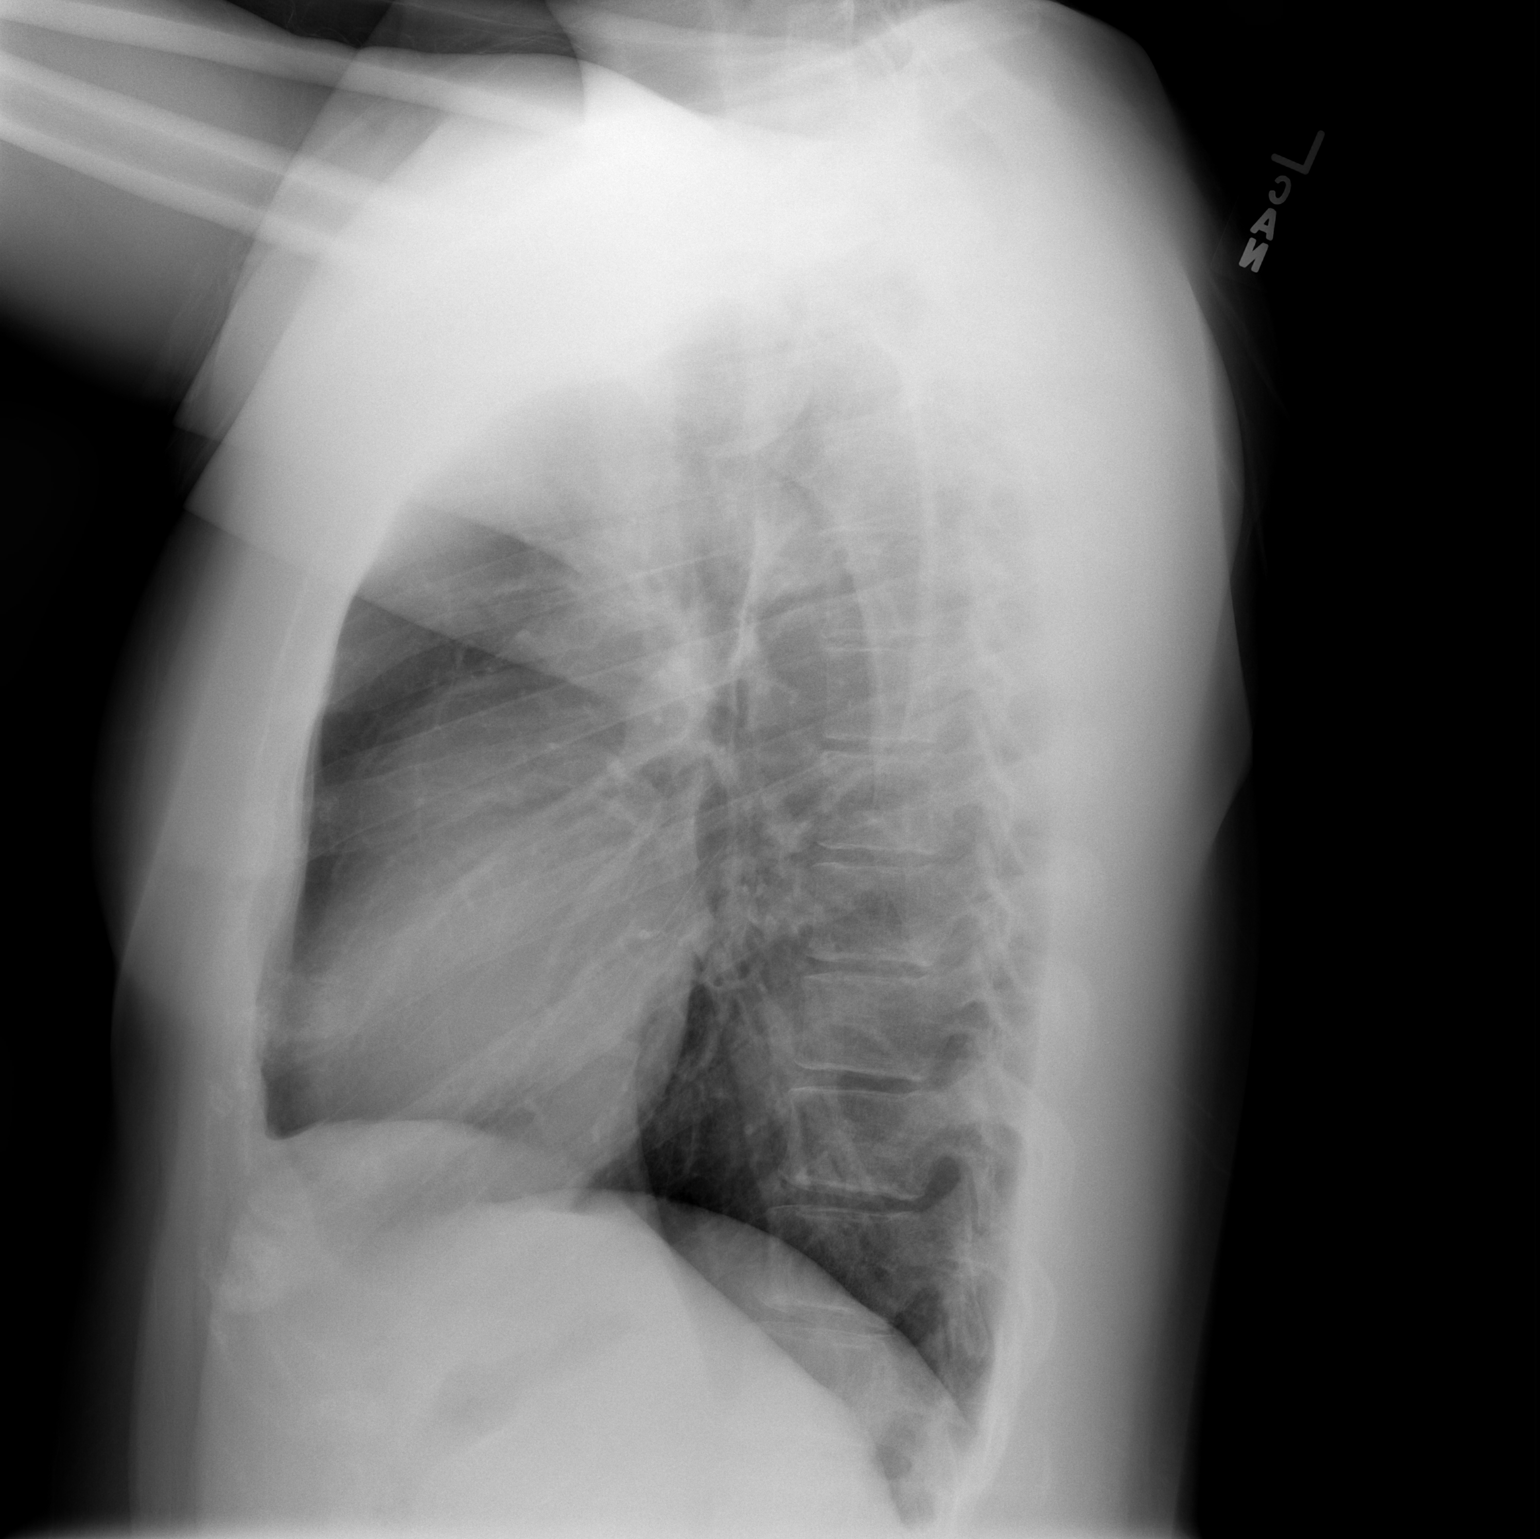

[2 of 2 positions shown; findings below may reference images not displayed]

FINDINGS: Cardiomediastinal silhouette is normal. No pleural effusions or
focal consolidations. Trachea projects midline and there is no
pneumothorax. Soft tissue planes and included osseous structures are
non-suspicious.
IMPRESSION: Stable normal chest.

## 2019-01-25 ENCOUNTER — Emergency Department (HOSPITAL_BASED_OUTPATIENT_CLINIC_OR_DEPARTMENT_OTHER)
Admission: EM | Admit: 2019-01-25 | Discharge: 2019-01-25 | Disposition: A | Discharge status: Home / Self Care | Payer: Medicaid Other | Attending: Emergency Medicine | Admitting: Emergency Medicine

## 2019-01-25 ENCOUNTER — Other Ambulatory Visit: Payer: Self-pay

## 2019-01-25 ENCOUNTER — Encounter (HOSPITAL_BASED_OUTPATIENT_CLINIC_OR_DEPARTMENT_OTHER): Payer: Self-pay | Admitting: Emergency Medicine

## 2019-01-25 DIAGNOSIS — K645 Perianal venous thrombosis: Secondary | ICD-10-CM | POA: Insufficient documentation

## 2019-01-25 MED ORDER — LIDOCAINE-EPINEPHRINE (PF) 2 %-1:200000 IJ SOLN
10.0000 mL | Freq: Once | INTRAMUSCULAR | Status: AC
  Administered 2019-01-25: 10 mL
  Filled 2019-01-25 (×2): qty 10

## 2019-01-25 NOTE — ED Triage Notes (Signed)
Used preporation H at home

## 2019-01-25 NOTE — ED Triage Notes (Signed)
Pt states that he noticed a hemorrhoid today. State it has swollen really big. Describes pain, itching, and burning. Denies blood on tissue of stool

## 2019-01-25 NOTE — ED Notes (Signed)
ED Provider at bedside. 

## 2019-01-25 NOTE — ED Provider Notes (Signed)
[facility] EMERGENCY DEPARTMENT Provider Note   CSN: [id number] Arrival date & time: 01/25/19  1920     History   Chief Complaint Chief Complaint  Patient presents with  . Rectal Pain    HPI Karl Flores is a 23 y.o. male.  He has no significant past medical history.  He is complaining of an acute hemorrhoid that started today.  He has never had this before.  He said his bowel movements were normal but he has been going to the gym so possibly straining more.  There is been no rectal bleeding.  It is quite painful and is unable to sit secondary to it.  No other complaints, no fevers or chills abdominal pain vomiting or diarrhea.     The history is provided by the patient.    Past Medical History:  Diagnosis Date  . GERD (gastroesophageal reflux disease)    occ    There are no active problems to display for this patient.   Past Surgical History:  Procedure Laterality Date  . KNEE ARTHROSCOPY WITH ANTERIOR CRUCIATE LIGAMENT (ACL) REPAIR WITH HAMSTRING GRAFT Left 12/04/2016   Procedure: KNEE ARTHROSCOPY WITH ANTERIOR CRUCIATE LIGAMENT (ACL) REPAIR WITH HAMSTRING GRAFT WITH POSSIBLE POSTROLATERAL CORNER RECONSTRUCTION AND LATERAL MENISCAL REPAIR VERSUS RESECTION;  Surgeon: Meredith Pel, MD;  Location: [facility];  Service: Orthopedics;  Laterality: Left;  . KNEE ARTHROSCOPY WITH MENISCAL REPAIR Left 12/04/2016   Procedure: KNEE ARTHROSCOPY WITH MENISCAL REPAIR;  Surgeon: Meredith Pel, MD;  Location: [facility];  Service: Orthopedics;  Laterality: Left;  . NO PAST SURGERIES          Home Medications    Prior to Admission medications   Medication Sig Start Date End Date Taking? Authorizing Provider  LORazepam (ATIVAN) 1 MG tablet Take 0.5 tablets (0.5 mg total) by mouth every 8 (eight) hours as needed for anxiety. 04/14/17   Mackuen, Fredia Sorrow, MD    Family History Family History  Problem Relation Age of Onset  . Diabetes Father   . Hyperlipidemia  Father   . Hypertension Father   . Heart disease Father     Social History Social History   Tobacco Use  . Smoking status: Never Smoker  . Smokeless tobacco: Never Used  . Tobacco comment: hookah once a week  Substance Use Topics  . Alcohol use: No  . Drug use: No     Allergies   Penicillins   Review of Systems Review of Systems  Constitutional: Negative for fever.  HENT: Negative for sore throat.   Respiratory: Negative for shortness of breath.   Cardiovascular: Negative for chest pain.  Gastrointestinal: Positive for rectal pain. Negative for abdominal pain.  Genitourinary: Negative for dysuria.  Skin: Negative for rash.     Physical Exam Updated Vital Signs BP 139/77   Pulse 95   Temp 98.5 F (36.9 C) (Oral)   Resp 20   Ht 6' (1.829 m)   Wt 111.1 kg   SpO2 99%   BMI 33.23 kg/m   Physical Exam Vitals signs and nursing note reviewed.  Constitutional:      Appearance: He is well-developed.  HENT:     Head: Normocephalic and atraumatic.  Eyes:     Conjunctiva/sclera: Conjunctivae normal.  Neck:     Musculoskeletal: Neck supple.  Pulmonary:     Effort: Pulmonary effort is normal.  Abdominal:     Palpations: There is no mass.     Tenderness: There is no  abdominal tenderness.  Genitourinary:    Rectum: Tenderness and external hemorrhoid present.    Skin:    General: Skin is warm and dry.  Neurological:     Mental Status: He is alert.     GCS: GCS eye subscore is 4. GCS verbal subscore is 5. GCS motor subscore is 6.      ED Treatments / Results  Labs (all labs ordered are listed, but only abnormal results are displayed) Labs Reviewed - No data to display  EKG None  Radiology No results found.  Procedures .Marland Kitchen.Incision and Drainage  Date/Time: 01/25/2019 7:50 PM Performed by: Terrilee FilesButler, Julena Barbour C, MD Authorized by: Terrilee FilesButler, Raley Novicki C, MD   Consent:    Consent obtained:  Verbal   Consent given by:  Patient   Risks discussed:  Bleeding,  incomplete drainage, pain and infection   Alternatives discussed:  No treatment, delayed treatment and referral Location:    Type:  External thrombosed hemorrhoid   Size:  3   Location:  Anogenital   Anogenital location:  Perianal Pre-procedure details:    Skin preparation:  Betadine Anesthesia (see MAR for exact dosages):    Anesthesia method:  Local infiltration   Local anesthetic:  Lidocaine 2% WITH epi Procedure type:    Complexity:  Simple Procedure details:    Incision types:  Single straight   Scalpel blade:  15   Wound management:  Probed and deloculated   Drainage:  Bloody (clot)   Drainage amount:  Scant   Wound treatment:  Wound left open   Packing materials:  None Post-procedure details:    Patient tolerance of procedure:  Tolerated well, no immediate complications   (including critical care time)  Medications Ordered in ED Medications  lidocaine-EPINEPHrine (XYLOCAINE W/EPI) 2 %-1:200000 (PF) injection 10 mL (has no administration in time range)     Initial Impression / Assessment and Plan / ED Course  I have reviewed the triage vital signs and the nursing notes.  Pertinent labs & imaging results that were available during my care of the patient were reviewed by me and considered in my medical decision making (see chart for details).          Final Clinical Impressions(s) / ED Diagnoses   Final diagnoses:  Thrombosed external hemorrhoid    ED Discharge Orders    None       Terrilee FilesButler, Aaliyah Gavel C, MD 01/25/19 2311

## 2019-01-25 NOTE — Discharge Instructions (Addendum)
You were seen in the emergency department for thrombosed hemorrhoid.  We incise the hemorrhoid and remove the clot.  This area will bleed a little bit more for the next day or so.  You should keep the area clean and can do sits baths to help soothe the area.  Tylenol or ibuprofen for pain.  Return if any concerns.

## 2019-01-25 NOTE — ED Notes (Signed)
Pt understood dc material. NAD noted. All questions answered to satisfaction. Pt escorted to check out window 

## 2019-02-21 ENCOUNTER — Other Ambulatory Visit: Payer: Self-pay

## 2019-02-21 ENCOUNTER — Encounter (HOSPITAL_BASED_OUTPATIENT_CLINIC_OR_DEPARTMENT_OTHER): Payer: Self-pay | Admitting: Emergency Medicine

## 2019-02-21 ENCOUNTER — Emergency Department (HOSPITAL_BASED_OUTPATIENT_CLINIC_OR_DEPARTMENT_OTHER)
Admission: EM | Admit: 2019-02-21 | Discharge: 2019-02-21 | Disposition: A | Discharge status: Home / Self Care | Payer: Medicaid Other | Attending: Emergency Medicine | Admitting: Emergency Medicine

## 2019-02-21 DIAGNOSIS — K219 Gastro-esophageal reflux disease without esophagitis: Secondary | ICD-10-CM | POA: Insufficient documentation

## 2019-02-21 DIAGNOSIS — R590 Localized enlarged lymph nodes: Secondary | ICD-10-CM

## 2019-02-21 NOTE — Discharge Instructions (Signed)
Please follow-up with [facility] if your suspected lymph node is not improving or returning back to normal.  Please return to emergency department if you develop increasing pain, redness, swelling, or fevers.  You can use warm compresses and take ibuprofen as prescribed over-the-counter, as needed for pain.

## 2019-02-21 NOTE — ED Provider Notes (Signed)
[facility] EMERGENCY DEPARTMENT Provider Note   CSN: [id number] Arrival date & time: 02/21/19  1129     History   Chief Complaint Chief Complaint  Patient presents with   Cyst    HPI Karl Flores is a 23 y.o. male with history of GERD presents with a one-month history of swelling to the right neck.  Patient describes the area as a small knot that is mostly pressure.  It is sometimes uncomfortable when he turns his neck.  He denies any fever or recent infection.  He denies any wounds on his face or ingrown hairs.  No interventions taken prior to arrival.     HPI  Past Medical History:  Diagnosis Date   GERD (gastroesophageal reflux disease)    occ    There are no active problems to display for this patient.   Past Surgical History:  Procedure Laterality Date   KNEE ARTHROSCOPY WITH ANTERIOR CRUCIATE LIGAMENT (ACL) REPAIR WITH HAMSTRING GRAFT Left 12/04/2016   Procedure: KNEE ARTHROSCOPY WITH ANTERIOR CRUCIATE LIGAMENT (ACL) REPAIR WITH HAMSTRING GRAFT WITH POSSIBLE POSTROLATERAL CORNER RECONSTRUCTION AND LATERAL MENISCAL REPAIR VERSUS RESECTION;  Surgeon: Meredith Pel, MD;  Location: [facility];  Service: Orthopedics;  Laterality: Left;   KNEE ARTHROSCOPY WITH MENISCAL REPAIR Left 12/04/2016   Procedure: KNEE ARTHROSCOPY WITH MENISCAL REPAIR;  Surgeon: Meredith Pel, MD;  Location: [facility];  Service: Orthopedics;  Laterality: Left;   NO PAST SURGERIES          Home Medications    Prior to Admission medications   Medication Sig Start Date End Date Taking? Authorizing Provider  LORazepam (ATIVAN) 1 MG tablet Take 0.5 tablets (0.5 mg total) by mouth every 8 (eight) hours as needed for anxiety. 04/14/17   Mackuen, Fredia Sorrow, MD    Family History Family History  Problem Relation Age of Onset   Diabetes Father    Hyperlipidemia Father    Hypertension Father    Heart disease Father     Social History Social History   Tobacco Use    Smoking status: Never Smoker   Smokeless tobacco: Never Used   Tobacco comment: hookah once a week  Substance Use Topics   Alcohol use: No   Drug use: No     Allergies   Penicillins   Review of Systems Review of Systems  Constitutional: Negative for fever.  Musculoskeletal: Positive for neck pain.     Physical Exam Updated Vital Signs BP 124/81 (BP Location: Left Arm)    Pulse 83    Temp 99.1 F (37.3 C) (Oral)    Resp 18    Ht 6' (1.829 m)    Wt 108 kg    SpO2 100%    BMI 32.28 kg/m   Physical Exam Vitals signs and nursing note reviewed.  Constitutional:      General: He is not in acute distress.    Appearance: He is well-developed. He is not diaphoretic.  HENT:     Head: Normocephalic and atraumatic.     Mouth/Throat:     Pharynx: No oropharyngeal exudate.  Eyes:     General: No scleral icterus.       Right eye: No discharge.        Left eye: No discharge.     Conjunctiva/sclera: Conjunctivae normal.     Pupils: Pupils are equal, round, and reactive to light.  Neck:     Musculoskeletal: Normal range of motion and neck supple.  Thyroid: No thyromegaly.   Cardiovascular:     Rate and Rhythm: Normal rate and regular rhythm.     Heart sounds: Normal heart sounds. No murmur. No friction rub. No gallop.   Pulmonary:     Effort: Pulmonary effort is normal. No respiratory distress.     Breath sounds: Normal breath sounds. No stridor. No wheezing or rales.  Abdominal:     General: Bowel sounds are normal. There is no distension.     Palpations: Abdomen is soft.     Tenderness: There is no abdominal tenderness. There is no guarding or rebound.  Lymphadenopathy:     Cervical: No cervical adenopathy.  Skin:    General: Skin is warm and dry.     Coloration: Skin is not pale.     Findings: No rash.  Neurological:     Mental Status: He is alert.     Coordination: Coordination normal.      ED Treatments / Results  Labs (all labs ordered are listed, but  only abnormal results are displayed) Labs Reviewed - No data to display  EKG None  Radiology No results found.  Procedures Ultrasound ED Soft Tissue  Date/Time: 02/21/2019 2:29 PM Performed by: Emi HolesLaw, Soniyah Mcglory M, PA-C Authorized by: Emi HolesLaw, Geniene List M, PA-C   Procedure details:    Indications comment:  Lymph node   Transverse view:  Visualized   Longitudinal view:  Visualized   Images: archived   Location:    Location: neck     Side:  Right Findings:     no abscess present    no cellulitis present    no foreign body present Comments:     Heterogeneous structure, not grossly fluid-filled   (including critical care time)  Medications Ordered in ED Medications - No data to display   Initial Impression / Assessment and Plan / ED Course  I have reviewed the triage vital signs and the nursing notes.  Pertinent labs & imaging results that were available during my care of the patient were reviewed by me and considered in my medical decision making (see chart for details).        Patient presenting with a suspected enlarged lymph node on the right cervical chain.  It is mildly tender.  There is no erythema or surrounding edema.  Bedside ultrasound appears to be a lymph node.  [facility] for further evaluation if it is not improving.  Patient also encouraged to follow-up with PCP.  Return precautions discussed including fever, increasing swelling or pain, redness.  He understands and agrees with plan.  Patient vitals stable throughout ED course and discharged in satisfactory condition.  Patient also evaluated by my attending, Dr. Ranae PalmsYelverton, who guided the patient's management and agrees with plan.  Final Clinical Impressions(s) / ED Diagnoses   Final diagnoses:  Lymphadenopathy of right cervical region    ED Discharge Orders    None       Emi HolesLaw, Shawnise Peterkin M, PA-C 02/21/19 1430    Loren RacerYelverton, David, MD 02/22/19 [id number]

## 2019-02-21 NOTE — ED Notes (Signed)
ED Provider at bedside. 

## 2019-02-21 NOTE — ED Triage Notes (Signed)
Pt reports a knot to R side of neck for several weeks.

## 2021-03-31 ENCOUNTER — Ambulatory Visit: Payer: Medicaid Other | Admitting: Family Medicine

## 2021-05-02 ENCOUNTER — Encounter: Payer: Self-pay | Admitting: Family Medicine

## 2021-05-02 ENCOUNTER — Ambulatory Visit (INDEPENDENT_AMBULATORY_CARE_PROVIDER_SITE_OTHER): Payer: 59 | Admitting: Family Medicine

## 2021-05-02 ENCOUNTER — Other Ambulatory Visit: Payer: Self-pay

## 2021-05-02 VITALS — BP 124/76 | HR 78 | Temp 97.4°F | Ht 72.0 in | Wt 230.6 lb

## 2021-05-02 DIAGNOSIS — Z1159 Encounter for screening for other viral diseases: Secondary | ICD-10-CM | POA: Diagnosis not present

## 2021-05-02 DIAGNOSIS — S29011A Strain of muscle and tendon of front wall of thorax, initial encounter: Secondary | ICD-10-CM

## 2021-05-02 DIAGNOSIS — Z13220 Encounter for screening for lipoid disorders: Secondary | ICD-10-CM | POA: Diagnosis not present

## 2021-05-02 DIAGNOSIS — Z131 Encounter for screening for diabetes mellitus: Secondary | ICD-10-CM | POA: Diagnosis not present

## 2021-05-02 LAB — LIPID PANEL
Cholesterol: 180 mg/dL (ref 0–200)
HDL: 39.5 mg/dL (ref >39.00)
LDL Cholesterol: 127 mg/dL — ABNORMAL HIGH (ref 0–99)
NonHDL: 140.71
Total CHOL/HDL Ratio: 5
Triglycerides: 68 mg/dL (ref 0.0–149.0)
VLDL: 13.6 mg/dL (ref 0.0–40.0)

## 2021-05-02 LAB — HEMOGLOBIN A1C: Hgb A1c MFr Bld: 5.2 % (ref 4.6–6.5)

## 2021-05-02 LAB — GLUCOSE, RANDOM: Glucose, Bld: 86 mg/dL (ref 70–99)

## 2021-05-02 NOTE — Progress Notes (Signed)
[facility] [address] [place] [address] Dept: [phone] Dept Fax: [phone]  New Patient Office Visit  Subjective:    Patient ID: Karl Flores, male    DOB: 06/17/1996, 25 y.o..   MRN: [id number]  Chief Complaint  Patient presents with   Establish Care    NP-Establish care.  C/o Rt peck muscle torn wants rechecked (injury happened 05/2019).  Fasting today.      History of Present Illness:  Patient is in today to establish care. Karl Flores was born in [place]. He is of [place] descent. His family left [place] in the later 1960s/early 1970s and went to [place]. During [facility], they moved again to [place]. 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 He has spent quite a bit of time in [place] in the past 4-5 years. He is single and has no children. He denies tobacco, alcohol, or drug use.   Karl Flores notes he suffered an acute injury to his right chest in 2020 while doing a bench press with a heavy weight. He recalls hearing a pop and then having acute pain over the anterior right chest wall. This was followed by a large bruised area. Since then, he has had ongoing pain in this area that is exacerbated by either pushing forward with the right arm or doing chest flies.  Past Medical History: There are no problems to display for this patient.  Past Surgical History:  Procedure Laterality Date   KNEE ARTHROSCOPY WITH ANTERIOR CRUCIATE LIGAMENT (ACL) REPAIR WITH HAMSTRING GRAFT Left 12/04/2016   Procedure: KNEE ARTHROSCOPY WITH ANTERIOR CRUCIATE LIGAMENT (ACL) REPAIR WITH HAMSTRING GRAFT WITH POSSIBLE POSTROLATERAL CORNER RECONSTRUCTION AND LATERAL MENISCAL REPAIR VERSUS RESECTION;  Surgeon: Cammy Copa, MD;  Location: [facility];  Service: Orthopedics;  Laterality: Left;   KNEE ARTHROSCOPY WITH MENISCAL REPAIR Left 12/04/2016   Procedure: KNEE ARTHROSCOPY WITH MENISCAL REPAIR;   Surgeon: Cammy Copa, MD;  Location: [facility] OR;  Service: Orthopedics;  Laterality: Left;   WISDOM TOOTH EXTRACTION     Family History  Problem Relation Age of Onset   Diabetes Karl Flores    Hyperlipidemia Karl Flores    Hypertension Karl Flores    Heart disease Karl Flores    Stroke Paternal Grandmother    Diabetes Paternal Grandfather    Heart disease Paternal Grandfather    Outpatient Medications Prior to Visit  Medication Sig Dispense Refill   magnesium 30 MG tablet Take 30 mg by mouth daily.     LORazepam (ATIVAN) 1 MG tablet Take 0.5 tablets (0.5 mg total) by mouth every 8 (eight) hours as needed for anxiety. 5 tablet 0   No facility-administered medications prior to visit.   Allergies  Allergen Reactions   Penicillins Other (See Comments)    Has patient had a PCN reaction causing immediate rash, facial/tongue/throat swelling, SOB or lightheadedness with hypotension: Unknown Has patient had a PCN reaction causing severe rash involving mucus membranes or skin necrosis: Unknown Has patient had a PCN reaction that required hospitalization: Unknown Has patient had a PCN reaction occurring within the last 10 years: No Unknown Childhood reaction  If all of the above answers are "NO", then may proceed with Cephalosporin use.       Objective:   Today's Vitals   05/02/21 1302  BP: 124/76  Pulse: 78  Temp: (!) 97.4 F (36.3 C)  TempSrc: Temporal  SpO2: 99%  Weight: 230 lb 9.6 oz (104.6  kg)  Height: 6' (1.829 m)   Body mass index is 31.27 kg/m.   General: Well developed, well nourished. No acute distress. Lungs: Clear to auscultation bilaterally. No wheezing, rales or rhonchi. CV: RRR without murmurs or rubs. Pulses 2+ bilaterally. Chest: Pain over the upper outer portion of the right chest wall. Weakness in pectoral muscles. Psych: Alert and oriented. Normal mood and affect.  Health Maintenance Due  Topic Date Due   COVID-19 Vaccine (1) Never done   HPV VACCINES (1 - Male 2-dose  series) Never done   Hepatitis C Screening  Never done   INFLUENZA VACCINE  Never done     Assessment & Plan:   1. Rupture of pectoralis major muscle, initial encounter [name] injury suggest a muscle or tendon tear for the pectoralis muscle. I will refer him to Sports medicine for further evaluation and to determine if there are options for resolving his pain and weakness.  - Ambulatory referral to Sports Medicine  2. Screening for diabetes mellitus (DM)  - Hemoglobin A1c - Glucose, random  3. Screening for lipid disorders  - Lipid panel  4. Encounter for hepatitis C screening test for low risk patient  - HCV Ab w Reflex to Quant PCR  Loyola Mast, MD

## 2021-05-03 LAB — HCV INTERPRETATION

## 2021-05-03 LAB — HCV AB W REFLEX TO QUANT PCR: HCV Ab: <0.1 s/co ratio (ref 0.0–0.9)

## 2021-05-04 ENCOUNTER — Ambulatory Visit: Payer: Self-pay

## 2021-05-04 ENCOUNTER — Other Ambulatory Visit: Payer: Self-pay

## 2021-05-04 ENCOUNTER — Ambulatory Visit (INDEPENDENT_AMBULATORY_CARE_PROVIDER_SITE_OTHER): Payer: 59 | Admitting: Sports Medicine

## 2021-05-04 VITALS — BP 120/82 | HR 66 | Ht 72.0 in | Wt 224.0 lb

## 2021-05-04 DIAGNOSIS — S29011A Strain of muscle and tendon of front wall of thorax, initial encounter: Secondary | ICD-10-CM | POA: Diagnosis not present

## 2021-05-04 DIAGNOSIS — M25511 Pain in right shoulder: Secondary | ICD-10-CM | POA: Diagnosis not present

## 2021-05-04 DIAGNOSIS — G8929 Other chronic pain: Secondary | ICD-10-CM | POA: Diagnosis not present

## 2021-05-04 NOTE — Progress Notes (Signed)
Karl Flores D.[facility] [address] [place] [address] Phone: [phone]   Assessment and Plan:     1. Rupture of pectoralis major muscle, initial encounter 2. Chronic right shoulder pain -Chronic, unchanged, initial sports medicine visit - Likely rupture of pectoralis major muscle in 05/2019 resulting in chronic right shoulder pain and arm weakness - With chronic nature of injury, we will attempt to improve strength and pain with physical therapy - Tylenol/NSAIDs as needed for pain control - Follow-up in 6 weeks for reevaluation.  If no improvement or worsening symptoms at that time we will get x-ray and MRI.  Could consider referral to orthopedic surgery based on MRI results, versus CSI or PRP injection at pectoralis major insertion point -[place] LIMITED JOINT SPACE STRUCTURES UP RIGHT(NO LINKED CHARGES); Future - Ambulatory referral to Physical Therapy   Sports Medicine: Musculoskeletal Ultrasound. 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  If no improvement or worsening symptoms at that time we will get x-ray and MRI.  Could consider referral to orthopedic surgery based on MRI results, versus CSI or PRP injection at pectoralis major insertion point     Subjective:   I, Karl Flores, am serving as a scribe for Dr. Richardean Sale  Chief Complaint: Pectoralis pain   HPI:   05/04/21 Patient is a 25 year old male presenting with right sided chest pain from a previous injury in 2020. Patient states the he was  doing a bench press when he heard a pop and then immediate pain on the right side of the chest wall that caused him to throw up. Patient states this area was severely bruised for awhile and since then has been having ongoing pain and weakness in this area that gets worse with pushing forward with right arm or chest flies. Patient was seen by his PCP 05/02/21 for this reason and referred to Sports medicine for further evaluation and treatment.   Relevant Historical Information: None pertinent  Additional pertinent review of systems negative.   Current Outpatient Medications:    magnesium 30 MG tablet, Take 30 mg by mouth daily. (Patient not taking: Reported on 05/04/2021), Disp: , Rfl:    Objective:     Vitals:   05/04/21 1001  BP: 120/82  Pulse: 66  SpO2: 98%  Weight: 224 lb (101.6 kg)  Height: 6' (1.829 m)      Body mass index is 30.38 kg/m.    Physical Exam:    Gen: Appears well, nad, nontoxic and pleasant Neuro:sensation intact, strength is 5/5 with df/pf/inv/ev, muscle tone wnl Skin: no suspicious lesion or defmority Psych: A&O, appropriate mood and affect  Right shoulder: no deformity, swelling or muscle wasting No scapular winging FF 180, abd 180, int 0, ext 90 TTP anterior forearm into pectoralis musculature NTTP over the [place], clavicle, ac, coracoid, biceps groove, humerus, deltoid, trapezius, cervical spine Decreased strength in adduction of right shoulder compared to left and 90 degrees of flexion activating pectoralis muscles Neg neer, hawkings, empty can, subscap liftoff, speeds, obriens, crossarm FROM of neck    Electronically signed by:  Karl Flores D.O.  [facility] 10:28 AM 05/04/21

## 2021-05-04 NOTE — Patient Instructions (Signed)
Good to see you Referral to physical therapy placed Follow up in 6 weeks

## 2021-05-15 ENCOUNTER — Ambulatory Visit: Payer: 59 | Attending: Sports Medicine | Admitting: Physical Therapy

## 2021-05-15 ENCOUNTER — Other Ambulatory Visit: Payer: Self-pay

## 2021-05-15 ENCOUNTER — Encounter: Payer: Self-pay | Admitting: Physical Therapy

## 2021-05-15 DIAGNOSIS — S29011A Strain of muscle and tendon of front wall of thorax, initial encounter: Secondary | ICD-10-CM | POA: Diagnosis not present

## 2021-05-15 DIAGNOSIS — M79601 Pain in right arm: Secondary | ICD-10-CM | POA: Diagnosis present

## 2021-05-15 DIAGNOSIS — M6281 Muscle weakness (generalized): Secondary | ICD-10-CM | POA: Diagnosis present

## 2021-05-15 NOTE — Therapy (Signed)
[facility] [address] [place], [place], [address] Phone: [phone]   Fax:  [phone]  Physical Therapy Evaluation  Patient Details  Name: Karl Flores MRN: [id number] Date of Birth: July 28, 1995 Referring Provider (PT): Richardean Sale   Encounter Date: 05/15/2021   PT End of Session - 05/15/21 1703     Visit Number 1    Number of Visits 13    Authorization Type [employer]    Authorization Time Period 05/15/21 to 06/26/21    PT Start Time 1619    PT Stop Time 1657    PT Time Calculation (min) 38 min    Activity Tolerance Patient tolerated treatment well    Behavior During Therapy [facility] for tasks assessed/performed             Past Medical History:  Diagnosis Date   GERD (gastroesophageal reflux disease)    occ    Past Surgical History:  Procedure Laterality Date   KNEE ARTHROSCOPY WITH ANTERIOR CRUCIATE LIGAMENT (ACL) REPAIR WITH HAMSTRING GRAFT Left 12/04/2016   Procedure: KNEE ARTHROSCOPY WITH ANTERIOR CRUCIATE LIGAMENT (ACL) REPAIR WITH HAMSTRING GRAFT WITH POSSIBLE POSTROLATERAL CORNER RECONSTRUCTION AND LATERAL MENISCAL REPAIR VERSUS RESECTION;  Surgeon: Cammy Copa, MD;  Location: [facility] OR;  Service: Orthopedics;  Laterality: Left;   KNEE ARTHROSCOPY WITH MENISCAL REPAIR Left 12/04/2016   Procedure: KNEE ARTHROSCOPY WITH MENISCAL REPAIR;  Surgeon: Cammy Copa, MD;  Location: [facility] OR;  Service: Orthopedics;  Laterality: Left;   WISDOM TOOTH EXTRACTION      There were no vitals filed for this visit.    Subjective Assessment - 05/15/21 1622     Subjective I was doing a bench press in 2020, I felt a pop and had immediate pain. I'd like to be able to work out like I used to, also if I hold even just a few pounds I have a hard time holding onto this. I can do pushups but I really have to modify form. I avoid doing chest at the gym. Its not really pain any more just a hard time doing things. 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 No/denies   only when working out- 5/10 at worst with chest exercises/strength               [facility] PT Assessment - 05/15/21 0001       Assessment   Medical Diagnosis pec tear    Referring Provider (PT) Richardean Sale    Onset Date/Surgical Date --   December 2020   Next MD Visit 12/27 with Dr. Jean Rosenthal    Prior Therapy for his knee      Precautions   Precautions None    Precaution Comments everything as tolerated- no restriction      Restrictions   Weight Bearing Restrictions No      Balance Screen   Has the patient fallen in the past 6 months No    Has the patient had a decrease in activity level because of a fear of falling?  No    Is the patient reluctant to leave their home because of a fear of falling?  No      Home Tourist information centre manager residence      Prior Function   Level of Independence Independent;Independent with basic  ADLs    Leisure swimming and biking, staying active      ROM / Strength   AROM / PROM / Strength AROM;Strength      AROM   AROM Assessment Site Shoulder    Right/Left Shoulder Right;Left    Right Shoulder Flexion --   full range   Right Shoulder ABduction --   full range   Right Shoulder Internal Rotation --   T6   Right Shoulder External Rotation --   T2   Left Shoulder Flexion --   full range   Left Shoulder ABduction --   full range   Left Shoulder Internal Rotation --   T6   Left Shoulder External Rotation --   T2     Strength   Overall Strength Comments rhomboids4/5 L 3/5 R, lower trap 3/5 B; middle/upper/lower 4+/5 to 5/5    Strength Assessment Site Shoulder;Elbow    Right/Left Shoulder Right;Left    Right Shoulder Flexion 4/5    Right Shoulder Extension 4/5    Right Shoulder ABduction 4/5    Right Shoulder Internal Rotation 4/5    Right Shoulder External Rotation  4/5    Left Shoulder Flexion 4+/5    Left Shoulder Extension 3+/5    Left Shoulder ABduction 5/5    Left Shoulder Internal Rotation 4/5    Left Shoulder External Rotation 4/5    Right/Left Elbow Right;Left      Palpation   Palpation comment middle and lower branches of pec very tender; upper branch of pec OK, not super painful                        Objective measurements completed on examination: See above findings.       [facility] Adult PT Treatment/Exercise - 05/15/21 0001       Exercises   Exercises Shoulder      Shoulder Exercises: Prone   Other Prone Exercises blackburn 6 1x10      Shoulder Exercises: Standing   Other Standing Exercises rows and lat pull downs with lime green TB 1x5                     PT Education - 05/15/21 1701     Education Details exam findings, POC, HEP; encouraged activity within pain free limitations    Person(s) Educated Patient    Methods Explanation;Demonstration;Handout    Comprehension Verbalized understanding;Returned demonstration              PT Short Term Goals - 05/15/21 1751       PT SHORT TERM GOAL #1   Title Will be independent in appropriate progressive HEP    Time 3    Period Weeks    Status New    Target Date 06/05/21      PT SHORT TERM GOAL #2   Title Will experience pain as being no more than 2/10 with exercise    Time 3    Period Weeks    Status New      PT SHORT TERM GOAL #3   Title Will show at least 50% improvement in functional posture in order to improve general mechanics and shoulder girdle mobility    Time 3    Period Weeks    Status New               PT Long Term Goals - 05/15/21 1752       PT LONG TERM GOAL #1  Title Will improve MMT in weak muscles by at least one grade in order to assist in reducing pain and improving function    Time 6    Period Weeks    Status New    Target Date 06/26/21      PT LONG TERM GOAL #2   Title Will be able to perform 10  pushups with no more than 2/10 pain in R pec area    Time 6    Period Weeks    Status New      PT LONG TERM GOAL #3   Title Will be able to carry a 15# bag of groceries or a replicated baby (up to 20# weight in cradle position) in R UE without increased pain or difficulty maintaining grasp    Time 6    Period Weeks    Status New      PT LONG TERM GOAL #4   Title Will be compliant with appropriate advanced gym based training program to improve general strength while at the same time minimizing injury to torn muscle    Time 6    Period Weeks    Status New                    Plan - 05/15/21 1706     Clinical Impression Statement Hatcher arrives today reporting ongoing symptoms from a pec tear he sustained at the end of 2020- at the time he did not do anything to treat the injury, now major complaints are weakness and occasional pain in the R UE as well as difficulty with some gym based activities. He reports MD has cleared him for activity as tolerated at this point so we will plan to [place] pain as our guide during rehab process. Exam is as expected for this injury- demonstrates postural impairment along with general weakness of shoulder girdle and limited strength in R pec musculature. Will really benefit from skilled PT services to address functional limitations although we will need to be judicious so as to not further injure torn musculature.    Personal Factors and Comorbidities Age;Fitness;Past/Current Experience;Social Background    Examination-Activity Limitations Lift;Carry;Reach Overhead    Examination-Participation Restrictions Cleaning;Community Activity;Interpersonal Relationship;Yard Work    Stability/Clinical Decision Making Stable/Uncomplicated    Optometrist Low    Rehab Potential Excellent    PT Frequency 2x / week    PT Duration 6 weeks    PT Treatment/Interventions ADLs/Self Care Home Management;Cryotherapy;Electrical Stimulation;Iontophoresis 4mg /ml  Dexamethasone;Moist Heat;Ultrasound;Functional mobility training;Therapeutic activities;Therapeutic exercise;Patient/family education;Manual techniques;Passive range of motion;Dry needling;Energy conservation;Taping    PT Next Visit Plan review HEP PRN; work on strengthening shoulder girdle and postural muscles with caution in context of R pec tear. Use pain as guide.    PT Home Exercise Plan blackburn 6 [id number]    Consulted and Agree with Plan of Care Patient             Patient will benefit from skilled therapeutic intervention in order to improve the following deficits and impairments:  Impaired UE functional use, Pain, Decreased strength, Decreased mobility, Postural dysfunction  Visit Diagnosis: Pain in right arm  Muscle weakness (generalized)     Problem List There are no problems to display for this patient.   Madelaine Etienne, DPT, PN2   Supplemental Physical Therapist [facility]    Pager [phone] Acute Rehab Office [phone]   [facility] [address] [place], [place], [address] Phone:  [phone]   Fax:  [phone]  Name: Karl Flores MRN: [id number] Date of Birth: Jul 14, 1995

## 2021-05-23 ENCOUNTER — Encounter: Payer: Self-pay | Admitting: Physical Therapy

## 2021-05-23 ENCOUNTER — Ambulatory Visit: Payer: 59 | Attending: Sports Medicine | Admitting: Physical Therapy

## 2021-05-23 ENCOUNTER — Other Ambulatory Visit: Payer: Self-pay

## 2021-05-23 DIAGNOSIS — M6281 Muscle weakness (generalized): Secondary | ICD-10-CM | POA: Insufficient documentation

## 2021-05-23 DIAGNOSIS — M79601 Pain in right arm: Secondary | ICD-10-CM | POA: Insufficient documentation

## 2021-05-23 NOTE — Therapy (Signed)
[facility] [address] [place], [place], [address] Phone: [phone]   Fax:  [phone]  Physical Therapy Treatment  Patient Details  Name: Karl Flores MRN: [id number] Date of Birth: 1995-11-28 Referring Provider (PT): Richardean Sale   Encounter Date: 05/23/2021   PT End of Session - 05/23/21 1456     Visit Number 2    Number of Visits 13    Authorization Type [employer]    Authorization Time Period 05/15/21 to 06/26/21    PT Start Time 1404    PT Stop Time 1443    PT Time Calculation (min) 39 min    Activity Tolerance Patient tolerated treatment well    Behavior During Therapy [facility] for tasks assessed/performed             Past Medical History:  Diagnosis Date   GERD (gastroesophageal reflux disease)    occ    Past Surgical History:  Procedure Laterality Date   KNEE ARTHROSCOPY WITH ANTERIOR CRUCIATE LIGAMENT (ACL) REPAIR WITH HAMSTRING GRAFT Left 12/04/2016   Procedure: KNEE ARTHROSCOPY WITH ANTERIOR CRUCIATE LIGAMENT (ACL) REPAIR WITH HAMSTRING GRAFT WITH POSSIBLE POSTROLATERAL CORNER RECONSTRUCTION AND LATERAL MENISCAL REPAIR VERSUS RESECTION;  Surgeon: Cammy Copa, MD;  Location: [facility] OR;  Service: Orthopedics;  Laterality: Left;   KNEE ARTHROSCOPY WITH MENISCAL REPAIR Left 12/04/2016   Procedure: KNEE ARTHROSCOPY WITH MENISCAL REPAIR;  Surgeon: Cammy Copa, MD;  Location: [facility] OR;  Service: Orthopedics;  Laterality: Left;   WISDOM TOOTH EXTRACTION      There were no vitals filed for this visit.   Subjective Assessment - 05/23/21 1405     Subjective I'm doing alright, I can do what I want to do in general except for the gym. Its frustrating going from being able to lift heavy weights to having to do lighter ones. HEP is going, I do them mostly at the gym. I go to the gym every day after 5, cardio and weights.    Patient Stated Goals build as much strength back up as I can    Currently in  Pain? No/denies                               [facility] Adult PT Treatment/Exercise - 05/23/21 0001       Shoulder Exercises: Supine   Other Supine Exercises lat pull downs 20# KB 1x10; serratus punches 2x5 20# KB    Other Supine Exercises single arm lat pull downs 7# 1x10      Shoulder Exercises: Seated   Row Both;Strengthening    Row Weight (lbs) 55   2x10   External Rotation Right;10 reps    External Rotation Weight (lbs) 15   towel tucked under elbow   Internal Rotation Strengthening    Internal Rotation Weight (lbs) 15    Internal Rotation Limitations towel tucked under elbow    Flexion Right;10 reps    Flexion Weight (lbs) 15    Flexion Limitations superset with extension    Diagonals 10 reps    Diagonals Weight (lbs) 15    Other Seated Exercises punches 1x10 5#    Other Seated Exercises OH presses 20# KB; lat pull downs 3x10 with progressive distance between hands 55#; shoulder extension pull downs 15# 2x10 super set mixed with extensions      Shoulder Exercises: Prone   Other Prone Exercises blackburn 6 1x10 2#  Shoulder Exercises: Sidelying   Other Sidelying Exercises sidelying flies 7# 1x10      Shoulder Exercises: Standing   Other Standing Exercises KB chamber and OH press 1x10 20#                     PT Education - 05/23/21 1456     Education Details exercise form and purpose; reasoning for specific form of exercises vs other versions; HEP updated    Person(s) Educated Patient    Methods Explanation    Comprehension Verbalized understanding              PT Short Term Goals - 05/15/21 1751       PT SHORT TERM GOAL #1   Title Will be independent in appropriate progressive HEP    Time 3    Period Weeks    Status New    Target Date 06/05/21      PT SHORT TERM GOAL #2   Title Will experience pain as being no more than 2/10 with exercise    Time 3    Period Weeks    Status New      PT SHORT TERM GOAL #3   Title  Will show at least 50% improvement in functional posture in order to improve general mechanics and shoulder girdle mobility    Time 3    Period Weeks    Status New               PT Long Term Goals - 05/15/21 1752       PT LONG TERM GOAL #1   Title Will improve MMT in weak muscles by at least one grade in order to assist in reducing pain and improving function    Time 6    Period Weeks    Status New    Target Date 06/26/21      PT LONG TERM GOAL #2   Title Will be able to perform 10 pushups with no more than 2/10 pain in R pec area    Time 6    Period Weeks    Status New      PT LONG TERM GOAL #3   Title Will be able to carry a 15# bag of groceries or a replicated baby (up to 20# weight in cradle position) in R UE without increased pain or difficulty maintaining grasp    Time 6    Period Weeks    Status New      PT LONG TERM GOAL #4   Title Will be compliant with appropriate advanced gym based training program to improve general strength while at the same time minimizing injury to torn muscle    Time 6    Period Weeks    Status New                   Plan - 05/23/21 1457     Clinical Impression Statement Karl Flores arrives today feeling well, able to do what he wants during the day, just frustrated that he can't current do heavy weights at the gym. Progressed as able within pain free range/exercises- remains motivated to recover as much as possible. Tried to improve functional strength within pain limits while still providing challenge and promote recovery. Will continue to progress as able and tolerated. Will consider dry needling vs IASTM next session.    Personal Factors and Comorbidities Age;Fitness;Past/Current Experience;Social Background    Examination-Activity Limitations Lift;Carry;Reach Overhead    Examination-Participation Restrictions  Cleaning;Community Activity;Interpersonal Relationship;Yard Work    Stability/Clinical Decision Making  Stable/Uncomplicated    Optometrist Low    Rehab Potential Excellent    PT Frequency 2x / week    PT Duration 6 weeks    PT Treatment/Interventions ADLs/Self Care Home Management;Cryotherapy;Electrical Stimulation;Iontophoresis 4mg /ml Dexamethasone;Moist Heat;Ultrasound;Functional mobility training;Therapeutic activities;Therapeutic exercise;Patient/family education;Manual techniques;Passive range of motion;Dry needling;Energy conservation;Taping    PT Next Visit Plan continue progressing strength as tolerated; caution in context of R pec tear, use pain as a guide. Consider DN vs IASTM    PT Home Exercise Plan blackburn 6 [id number], wall pushups with hands shoulder width apart    Consulted and Agree with Plan of Care Patient             Patient will benefit from skilled therapeutic intervention in order to improve the following deficits and impairments:  Impaired UE functional use, Pain, Decreased strength, Decreased mobility, Postural dysfunction  Visit Diagnosis: Pain in right arm  Muscle weakness (generalized)     Problem List There are no problems to display for this patient.  , DPT, PN2   Supplemental Physical Therapist [facility]    Pager [phone] Acute Rehab Office [phone]   [facility] [address] [place], [place], [place] Phone: [phone]   Fax:  [phone]  Name: Karl Flores MRN: [name] Date of Birth: 11-09-1995

## 2021-05-25 ENCOUNTER — Encounter: Payer: Self-pay | Admitting: Physical Therapy

## 2021-05-25 ENCOUNTER — Other Ambulatory Visit: Payer: Self-pay

## 2021-05-25 ENCOUNTER — Ambulatory Visit: Payer: 59 | Admitting: Physical Therapy

## 2021-05-25 DIAGNOSIS — M79601 Pain in right arm: Secondary | ICD-10-CM | POA: Diagnosis not present

## 2021-05-25 DIAGNOSIS — M6281 Muscle weakness (generalized): Secondary | ICD-10-CM

## 2021-05-25 NOTE — Therapy (Signed)
[facility] [address] [place], [place], [address] Phone: [phone]   Fax:  [phone]  Physical Therapy Treatment  Patient Details  Name: Karl Flores MRN: [id number] Date of Birth: 1996/04/10 Referring Provider (PT): Richardean Sale   Encounter Date: 05/25/2021   PT End of Session - 05/25/21 1451     Visit Number 3    Number of Visits 13    Authorization Type [employer]    Authorization Time Period 05/15/21 to 06/26/21    PT Start Time 1402    PT Stop Time 1443    PT Time Calculation (min) 41 min    Activity Tolerance Patient tolerated treatment well    Behavior During Therapy [facility] for tasks assessed/performed             Past Medical History:  Diagnosis Date   GERD (gastroesophageal reflux disease)    occ    Past Surgical History:  Procedure Laterality Date   KNEE ARTHROSCOPY WITH ANTERIOR CRUCIATE LIGAMENT (ACL) REPAIR WITH HAMSTRING GRAFT Left 12/04/2016   Procedure: KNEE ARTHROSCOPY WITH ANTERIOR CRUCIATE LIGAMENT (ACL) REPAIR WITH HAMSTRING GRAFT WITH POSSIBLE POSTROLATERAL CORNER RECONSTRUCTION AND LATERAL MENISCAL REPAIR VERSUS RESECTION;  Surgeon: Cammy Copa, MD;  Location: [facility] OR;  Service: Orthopedics;  Laterality: Left;   KNEE ARTHROSCOPY WITH MENISCAL REPAIR Left 12/04/2016   Procedure: KNEE ARTHROSCOPY WITH MENISCAL REPAIR;  Surgeon: Cammy Copa, MD;  Location: [facility] OR;  Service: Orthopedics;  Laterality: Left;   WISDOM TOOTH EXTRACTION      There were no vitals filed for this visit.   Subjective Assessment - 05/25/21 1403     Subjective I'm doing good, I've added some of the things we worked on last time, still want to just make sure I'm getting stronger. I've been trying incline pushups. I miss pushups and chest workouts    Patient Stated Goals build as much strength back up as I can    Currently in Pain? No/denies                               [facility]  Adult PT Treatment/Exercise - 05/25/21 0001       Shoulder Exercises: Supine   Other Supine Exercises thoracic extension stretches 2 min static; dynamic stretches with OH reach 1x15    Other Supine Exercises serratus punches 20# KB 1x10 close spotting      Shoulder Exercises: Seated   Row Both;15 reps    Row Weight (lbs) 55   1x15   External Rotation Right;15 reps    External Rotation Weight (lbs) 15    External Rotation Limitations towel tucked under elbow    Internal Rotation Right;15 reps    Internal Rotation Weight (lbs) 15    Internal Rotation Limitations towel tucked under elbow    Flexion Right;15 reps    Flexion Weight (lbs) 15    Flexion Limitations superset with extension    Abduction Right;Both;10 reps    ABduction Weight (lbs) 15    Diagonals 10 reps    Diagonals Weight (lbs) 15    Other Seated Exercises lat pulls 55# 1x15      Shoulder Exercises: Prone   Other Prone Exercises blackburn 6 1x10 3#    Other Prone Exercises prone butterflies 1x10; quadrued thoracic rotations 2x10, cat cow 1x10      Shoulder Exercises: Sidelying   Other Sidelying Exercises sidelying flies into sidelying  forward punch 7# 1x10      Shoulder Exercises: Standing   Other Standing Exercises KB rows 1x15 20#    Other Standing Exercises wall pushup holds 1x15; PU with isometric holds 1x5and 5 second holds                     PT Education - 05/25/21 1450     Education Details exercise form and purpose; ok to add these to gym activities as tolerated- use pain as a guide. Add thoracic mobility to HEP    Person(s) Educated Patient    Methods Explanation    Comprehension Verbalized understanding              PT Short Term Goals - 05/15/21 1751       PT SHORT TERM GOAL #1   Title Will be independent in appropriate progressive HEP    Time 3    Period Weeks    Status New    Target Date 06/05/21      PT SHORT TERM GOAL #2   Title Will experience pain as being no more  than 2/10 with exercise    Time 3    Period Weeks    Status New      PT SHORT TERM GOAL #3   Title Will show at least 50% improvement in functional posture in order to improve general mechanics and shoulder girdle mobility    Time 3    Period Weeks    Status New               PT Long Term Goals - 05/15/21 1752       PT LONG TERM GOAL #1   Title Will improve MMT in weak muscles by at least one grade in order to assist in reducing pain and improving function    Time 6    Period Weeks    Status New    Target Date 06/26/21      PT LONG TERM GOAL #2   Title Will be able to perform 10 pushups with no more than 2/10 pain in R pec area    Time 6    Period Weeks    Status New      PT LONG TERM GOAL #3   Title Will be able to carry a 15# bag of groceries or a replicated baby (up to 20# weight in cradle position) in R UE without increased pain or difficulty maintaining grasp    Time 6    Period Weeks    Status New      PT LONG TERM GOAL #4   Title Will be compliant with appropriate advanced gym based training program to improve general strength while at the same time minimizing injury to torn muscle    Time 6    Period Weeks    Status New                   Plan - 05/25/21 1452     Clinical Impression Statement [name] arrives feeling well today- has added some of the exercises from last PT session into his gym routine. Continued to progress functional strengthening of shoulder girdle and supportive musculature today, as well as challenging pec through pain free exercises. Increased focus on thoracic mobility drills today- very stiff and tight here which is likely contributing to symptoms. Continues to remain very motivated, anticipate he will make great progress moving forward.    Personal Factors and Comorbidities Age;Fitness;Past/Current  Experience;Social Background    Examination-Activity Limitations Lift;Carry;Reach Overhead    Examination-Participation  Restrictions Cleaning;Community Activity;Interpersonal Relationship;Yard Work    Stability/Clinical Decision Making Stable/Uncomplicated    Optometrist Low    Rehab Potential Excellent    PT Frequency 2x / week    PT Duration 6 weeks    PT Treatment/Interventions ADLs/Self Care Home Management;Cryotherapy;Electrical Stimulation;Iontophoresis 4mg /ml Dexamethasone;Moist Heat;Ultrasound;Functional mobility training;Therapeutic activities;Therapeutic exercise;Patient/family education;Manual techniques;Passive range of motion;Dry needling;Energy conservation;Taping    PT Next Visit Plan continue progressing strength as tolerated; 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 Consider DN vs IASTM    PT Home Exercise Plan blackburn 6 [id number], wall pushups with hands shoulder width apart, thoracic mobility drills    Consulted and Agree with Plan of Care Patient             Patient will benefit from skilled therapeutic intervention in order to improve the following deficits and impairments:  Impaired UE functional use, Pain, Decreased strength, Decreased mobility, Postural dysfunction  Visit Diagnosis: Pain in right arm  Muscle weakness (generalized)     Problem List There are no problems to display for this patient.  , DPT, PN2   Supplemental Physical Therapist [facility]    Pager [phone] Acute Rehab Office [phone]   [facility] [address] [place], [place], [place] Phone: [phone]   Fax:  [phone]  Name: AVROHOM MCKELVIN MRN: [name] Date of Birth: 04/28/1996

## 2021-05-30 ENCOUNTER — Telehealth: Payer: Self-pay | Admitting: Physical Therapy

## 2021-05-30 ENCOUNTER — Ambulatory Visit: Payer: 59 | Admitting: Physical Therapy

## 2021-05-30 NOTE — Telephone Encounter (Signed)
No-show for today's appt. I called and spoke to him directly, he apologizes as work got busy and the appt time slipped from his mind. Educated on next appt time later this week, we will see him then.  Madelaine Etienne, DPT, PN2   Supplemental Physical Therapist [facility]    Pager [phone] Acute Rehab Office [phone]

## 2021-06-01 ENCOUNTER — Other Ambulatory Visit: Payer: Self-pay

## 2021-06-01 ENCOUNTER — Ambulatory Visit: Payer: 59 | Admitting: Physical Therapy

## 2021-06-01 DIAGNOSIS — M6281 Muscle weakness (generalized): Secondary | ICD-10-CM

## 2021-06-01 DIAGNOSIS — M79601 Pain in right arm: Secondary | ICD-10-CM | POA: Diagnosis not present

## 2021-06-01 NOTE — Therapy (Signed)
[place]. [place], [place], [address] Phone: [phone]   Fax:  [phone]  Physical Therapy Treatment  Patient Details  Name: Karl Flores MRN: [id number] Date of Birth: [birth year] Referring Provider (PT): Glennon Mac   Encounter Date: 06/01/2021   PT End of Session - 06/01/21 1611     Visit Number 4    Number of Visits 13    Authorization Type [employer]    Authorization Time Period 05/15/21 to 06/26/21    PT Start Time 1530    PT Stop Time 1610    PT Time Calculation (min) 40 min             Past Medical History:  Diagnosis Date   GERD (gastroesophageal reflux disease)    occ    Past Surgical History:  Procedure Laterality Date   KNEE ARTHROSCOPY WITH ANTERIOR CRUCIATE LIGAMENT (ACL) REPAIR WITH HAMSTRING GRAFT Left 12/04/2016   Procedure: KNEE ARTHROSCOPY WITH ANTERIOR CRUCIATE LIGAMENT (ACL) REPAIR WITH HAMSTRING GRAFT WITH POSSIBLE POSTROLATERAL CORNER RECONSTRUCTION AND LATERAL MENISCAL REPAIR VERSUS RESECTION;  Surgeon: Meredith Pel, MD;  Location: [place];  Service: Orthopedics;  Laterality: Left;   KNEE ARTHROSCOPY WITH MENISCAL REPAIR Left 12/04/2016   Procedure: KNEE ARTHROSCOPY WITH MENISCAL REPAIR;  Surgeon: Meredith Pel, MD;  Location: [place];  Service: Orthopedics;  Laterality: Left;   WISDOM TOOTH EXTRACTION      There were no vitals filed for this visit.   Subjective Assessment - 06/01/21 1532     Subjective only hurts if I work out hard    Currently in Pain? No/denies                               [facility] Adult PT Treatment/Exercise - 06/01/21 0001       Shoulder Exercises: Supine   Other Supine Exercises serratus punches 10#   alt flex/ext 20x 10 #     Shoulder Exercises: Prone   Other Prone Exercises plank press on BOSU 20 reps 3 sets    Other Prone Exercises quadreped row into ext 20x   inclined push up     Shoulder Exercises:  Standing   Other Standing Exercises 3 pt rhy stab RT UE 10 reps 2 sets    Other Standing Exercises 8# PNF 2 sets 10      Shoulder Exercises: ROM/Strengthening   UBE (Upper Arm Bike) L 2 2 min fwd/2 min backward    Nustep L 5 2 min push/pull and horz abd                       PT Short Term Goals - 06/01/21 1609       PT SHORT TERM GOAL #1   Title Will be independent in appropriate progressive HEP    Status Achieved      PT SHORT TERM GOAL #2   Title Will experience pain as being no more than 2/10 with exercise    Status Partially Met      PT SHORT TERM GOAL #3   Title Will show at least 50% improvement in functional posture in order to improve general mechanics and shoulder girdle mobility    Status Partially Met               PT Long Term Goals - 05/15/21 1752       PT LONG TERM  GOAL #1   Title Will improve MMT in weak muscles by at least one grade in order to assist in reducing pain and improving function    Time 6    Period Weeks    Status New    Target Date 06/26/21      PT LONG TERM GOAL #2   Title Will be able to perform 10 pushups with no more than 2/10 pain in R pec area    Time 6    Period Weeks    Status New      PT LONG TERM GOAL #3   Title Will be able to carry a 15# bag of groceries or a replicated baby (up to 39# weight in cradle position) in R UE without increased pain or difficulty maintaining grasp    Time 6    Period Weeks    Status New      PT LONG TERM GOAL #4   Title Will be compliant with appropriate advanced gym based training program to improve general strength while at the same time minimizing injury to torn muscle    Time 6    Period Weeks    Status New                   Plan - 06/01/21 1612     Clinical Impression Statement pt did well today but had alot of muscle fatigue requiring rest. progressing with goals    PT Treatment/Interventions ADLs/Self Care Home Management;Cryotherapy;Electrical  Stimulation;Iontophoresis 65m/ml Dexamethasone;Moist Heat;Ultrasound;Functional mobility training;Therapeutic activities;Therapeutic exercise;Patient/family education;Manual techniques;Passive range of motion;Dry needling;Energy conservation;Taping    PT Next Visit Plan at pt request provide HEP of free wt ex today             Patient will benefit from skilled therapeutic intervention in order to improve the following deficits and impairments:  Impaired UE functional use, Pain, Decreased strength, Decreased mobility, Postural dysfunction  Visit Diagnosis: Muscle weakness (generalized)     Problem List There are no problems to display for this patient.   Tiyanna Larcom,ANGIE, PTA 06/01/2021, 4:14 PM  [place] [address]Phone: [phone]  Fax:  [phone] Name: Karl KEEVENMRN: [id number] of Birth: [birth year]

## 2021-06-06 ENCOUNTER — Ambulatory Visit: Payer: 59 | Admitting: Physical Therapy

## 2021-06-07 ENCOUNTER — Ambulatory Visit: Payer: Medicaid Other | Admitting: Family Medicine

## 2021-06-08 ENCOUNTER — Encounter: Payer: Self-pay | Admitting: Physical Therapy

## 2021-06-08 ENCOUNTER — Other Ambulatory Visit: Payer: Self-pay

## 2021-06-08 ENCOUNTER — Ambulatory Visit: Payer: 59 | Admitting: Physical Therapy

## 2021-06-08 DIAGNOSIS — M6281 Muscle weakness (generalized): Secondary | ICD-10-CM

## 2021-06-08 DIAGNOSIS — M79601 Pain in right arm: Secondary | ICD-10-CM | POA: Diagnosis not present

## 2021-06-08 NOTE — Therapy (Signed)
[place]. [place], [place], [address] Phone: [phone]   Fax:  [phone]  Physical Therapy Treatment  Patient Details  Name: Karl Flores MRN: [id number] Date of Birth: 26-Jul-1995 Referring Provider (PT): Glennon Mac   Encounter Date: 06/08/2021   PT End of Session - 06/08/21 1454     Visit Number 5    Number of Visits 13    Authorization Type [employer]    Authorization Time Period 05/15/21 to 06/26/21    PT Start Time 5035    PT Stop Time 1444    PT Time Calculation (min) 42 min    Activity Tolerance Patient tolerated treatment well    Behavior During Therapy [facility] for tasks assessed/performed             Past Medical History:  Diagnosis Date   GERD (gastroesophageal reflux disease)    occ    Past Surgical History:  Procedure Laterality Date   KNEE ARTHROSCOPY WITH ANTERIOR CRUCIATE LIGAMENT (ACL) REPAIR WITH HAMSTRING GRAFT Left 12/04/2016   Procedure: KNEE ARTHROSCOPY WITH ANTERIOR CRUCIATE LIGAMENT (ACL) REPAIR WITH HAMSTRING GRAFT WITH POSSIBLE POSTROLATERAL CORNER RECONSTRUCTION AND LATERAL MENISCAL REPAIR VERSUS RESECTION;  Surgeon: Meredith Pel, MD;  Location: [place];  Service: Orthopedics;  Laterality: Left;   KNEE ARTHROSCOPY WITH MENISCAL REPAIR Left 12/04/2016   Procedure: KNEE ARTHROSCOPY WITH MENISCAL REPAIR;  Surgeon: Meredith Pel, MD;  Location: [place];  Service: Orthopedics;  Laterality: Left;   WISDOM TOOTH EXTRACTION      There were no vitals filed for this visit.   Subjective Assessment - 06/08/21 1402     Subjective I'm doing good, last workout was challenging. I just got back into swimming and I think the stretching helps. I see the referring next week I don't know if they are gonna do another MRI or what.    Patient Stated Goals build as much strength back up as I can    Currently in Pain? No/denies                                [facility] Adult PT Treatment/Exercise - 06/08/21 0001       Shoulder Exercises: Prone   Other Prone Exercises planks on bosu x30 seconds x3; CW and CCW circles on BOSU in plank; elbow plank on ball side of bosu x30   on BOSU   Other Prone Exercises swimmers x20; bear hold position with 20 alternating UE marches      Shoulder Exercises: Standing   Other Standing Exercises body blade 3 way 60 seconds each; wall ball CW and CCW circles x60 seconds; shoulders on fire 60 seconds each way    Other Standing Exercises 3 way band rhy stabl 1x10 B UEs                     PT Education - 06/08/21 1453     Education Details exercise form and purpose; avoid plank clocks on BOSU at gym for now    Person(s) Educated Patient    Methods Explanation    Comprehension Verbalized understanding              PT Short Term Goals - 06/01/21 1609       PT SHORT TERM GOAL #1   Title Will be independent in appropriate progressive HEP    Status Achieved  PT SHORT TERM GOAL #2   Title Will experience pain as being no more than 2/10 with exercise    Status Partially Met      PT SHORT TERM GOAL #3   Title Will show at least 50% improvement in functional posture in order to improve general mechanics and shoulder girdle mobility    Status Partially Met               PT Long Term Goals - 05/15/21 1752       PT LONG TERM GOAL #1   Title Will improve MMT in weak muscles by at least one grade in order to assist in reducing pain and improving function    Time 6    Period Weeks    Status New    Target Date 06/26/21      PT LONG TERM GOAL #2   Title Will be able to perform 10 pushups with no more than 2/10 pain in R pec area    Time 6    Period Weeks    Status New      PT LONG TERM GOAL #3   Title Will be able to carry a 15# bag of groceries or a replicated baby (up to 69# weight in cradle position) in R UE without increased pain or difficulty maintaining grasp    Time 6    Period  Weeks    Status New      PT LONG TERM GOAL #4   Title Will be compliant with appropriate advanced gym based training program to improve general strength while at the same time minimizing injury to torn muscle    Time 6    Period [place] - 06/08/21 [place] arrives today doing well; we continued to work on strengthening stabilizing musculature- really does have a hard time with fatigue with mm that stabilize shoulder girdle, especially body blade exercises and rhythmic stabilization based tasks. He sees the MD next week, hopefully they will be happy with progress thus far. Will continue to progress as we can.    Personal Factors and Comorbidities Age;Fitness;Past/Current Experience;Social Background    Examination-Activity Limitations Lift;Carry;Reach Overhead    Examination-Participation Restrictions Cleaning;Community Activity;Interpersonal Relationship;Yard Work    Stability/Clinical Decision Making Stable/Uncomplicated    Designer, jewellery Low    Rehab Potential Excellent    PT Frequency 2x / week    PT Duration 6 weeks    PT Treatment/Interventions ADLs/Self Care Home Management;Cryotherapy;Electrical Stimulation;Iontophoresis 59m/ml Dexamethasone;Moist Heat;Ultrasound;Functional mobility training;Therapeutic activities;Therapeutic exercise;Patient/family education;Manual techniques;Passive range of motion;Dry needling;Energy conservation;Taping    PT Next Visit Plan continue progressing shoulder stabilizing exercises    PT Home Exercise Plan blackburn 6 [id number], wall pushups with hands shoulder width apart, thoracic mobility drills    Consulted and Agree with Plan of Care Patient             Patient will benefit from skilled therapeutic intervention in order to improve the following deficits and impairments:  Impaired UE functional use, Pain, Decreased strength, Decreased mobility,  Postural dysfunction  Visit Diagnosis: Muscle weakness (generalized)  Pain in right arm     Problem List There are no problems to display for this patient.  KWindell Norfolk DPT, PN2   Supplemental Physical Therapist [place]   Pager [phone] Rehab Office  [place]. [place], [place], [address] Phone: [phone]   Fax:  [phone]  Name: Karl Flores MRN: [id number] Date of Birth: [birth year]

## 2021-06-09 NOTE — Progress Notes (Deleted)
° °   Aleen Sells D.[facility] [address] [place] [address] Phone: [phone]   Assessment and Plan:     There are no diagnoses linked to this encounter.  ***   Pertinent previous records reviewed include ***   Follow Up: ***     Subjective:   I, Moenique Parris, am serving as a Neurosurgeon for Doctor Richardean Sale  Chief Complaint: pectoralis pain   HPI:   05/04/21 Patient is a 25 year old male presenting with right sided chest pain from a previous injury in 2020. Patient states the he was doing a bench press when he heard a pop and then immediate pain on the right side of the chest wall that caused him to throw up. Patient states this area was severely bruised for awhile and since then has been having ongoing pain and weakness in this area that gets worse with pushing forward with right arm or chest flies. Patient was seen by his PCP 05/02/21 for this reason and referred to Sports medicine for further evaluation and treatment.    06/13/2021 Patient states   Relevant Historical Information: ***  Additional pertinent review of systems negative.   Current Outpatient Medications:    magnesium 30 MG tablet, Take 30 mg by mouth daily. (Patient not taking: Reported on 05/04/2021), Disp: , Rfl:    Objective:     There were no vitals filed for this visit.    There is no height or weight on file to calculate BMI.    Physical Exam:    ***   Electronically signed by:  Aleen Sells D[facility] 10:55 AM 06/09/21

## 2021-06-13 ENCOUNTER — Ambulatory Visit: Payer: 59 | Admitting: Physical Therapy

## 2021-06-13 ENCOUNTER — Ambulatory Visit: Payer: Medicaid Other | Admitting: Sports Medicine

## 2021-06-13 NOTE — Progress Notes (Deleted)
° °   [facility] [address] [place] [address] Phone: [phone]   Assessment and Plan:     There are no diagnoses linked to this encounter.  ***   Pertinent previous records reviewed include ***   Follow Up: ***     Subjective:   I, Karl Flores, am serving as a Neurosurgeon for Doctor Richardean Sale  Chief Complaint: pectoralis pain   HPI:   05/04/21 Patient is a 25 year old male presenting with right sided chest pain from a previous injury in 2020. Patient states the he was doing a bench press when he heard a pop and then immediate pain on the right side of the chest wall that caused him to throw up. Patient states this area was severely bruised for awhile and since then has been having ongoing pain and weakness in this area that gets worse with pushing forward with right arm or chest flies. Patient was seen by his PCP 05/02/21 for this reason and referred to Sports medicine for further evaluation and treatment.   06/14/2021 Patient states   Relevant Historical Information: ***  Additional pertinent review of systems negative.   Current Outpatient Medications:    magnesium 30 MG tablet, Take 30 mg by mouth daily. (Patient not taking: Reported on 05/04/2021), Disp: , Rfl:    Objective:     There were no vitals filed for this visit.    There is no height or weight on file to calculate BMI.    Physical Exam:    ***   Electronically signed by:  [facility] 12:27 PM 06/13/21

## 2021-06-14 ENCOUNTER — Encounter: Payer: 59 | Admitting: Physical Therapy

## 2021-06-14 ENCOUNTER — Ambulatory Visit: Payer: Medicaid Other | Admitting: Sports Medicine

## 2021-06-14 NOTE — Progress Notes (Signed)
° °   Karl Flores D.[facility] [address] [place] [address] Phone: [phone]   Assessment and Plan:     1. Chronic right shoulder pain 2. Rupture of pectoralis major muscle, initial encounter -Chronic, unchanged, subsequent visit - Continued weakness and difficulty with certain exercises, specifically wide stance push-ups and chest press consistent with history of pectoralis major rupture - X-ray obtained in clinic due to chronic weakness.  My interpretation: No acute fracture or dislocation. Unremarkable xray - Patient has failed to improve with >6 weeks of conservative therapy including HEP, physical therapy, Tylenol/NSAIDs as needed, so we will proceed with MRI for further evaluation.  Patient to follow-up after MRI is completed to review results and discuss treatment plan.  Could consider PRP   Pertinent previous records reviewed include PT note 06/08/2021, 06/01/2021, 05/30/2021   Follow Up: Patient has failed to improve with >6 weeks of conservative therapy including HEP, physical therapy, Tylenol/NSAIDs as needed, so we will proceed with MRI for further evaluation.  Patient to follow-up after MRI is completed to review results and discuss treatment plan.  Could consider PRP     Subjective:   I, Karl Flores, am serving as a Neurosurgeon for Doctor Richardean Sale  Chief Complaint: pectoralis pain   HPI:   05/04/21 Patient is a 25 year old male presenting with right sided chest pain from a previous injury in 2020. Patient states the he was doing a bench press when he heard a pop and then immediate pain on the right side of the chest wall that caused him to throw up. Patient states this area was severely bruised for awhile and since then has been having ongoing pain and weakness in this area that gets worse with pushing forward with right arm or chest flies. Patient was seen by his PCP 05/02/21 for this reason and referred to Sports medicine  for further evaluation and treatment.    06/15/2021 Patient states that he is still doing pt , feels like healed wrong so he feels like no matter what he does its still going to be there. No pain at all, still doesn't have full strength yet  Relevant Historical Information: None pertinent  Additional pertinent review of systems negative.   Current Outpatient Medications:    magnesium 30 MG tablet, Take 30 mg by mouth daily. (Patient not taking: Reported on 05/04/2021), Disp: , Rfl:    Objective:     Vitals:   06/15/21 1132  BP: 120/90  Pulse: 62  SpO2: 98%  Weight: 224 lb (101.6 kg)  Height: 6' (1.829 m)      Body mass index is 30.38 kg/m.    Physical Exam:    Gen: Appears well, nad, nontoxic and pleasant Neuro:sensation intact, strength is 5/5 with df/pf/inv/ev, muscle tone wnl Skin: no suspicious lesion or defmority Psych: A&O, appropriate mood and affect   Right shoulder: no deformity, swelling or muscle wasting No scapular winging FF 180, abd 180, int 0, ext 90 TTP anterior forearm into pectoralis musculature NTTP over the [place], clavicle, ac, coracoid, biceps groove, humerus, deltoid, trapezius, cervical spine Decreased strength in adduction of right shoulder compared to left and 90 degrees of flexion activating pectoralis muscles Neg neer, hawkings, empty can, subscap liftoff, speeds, obriens, crossarm FROM of neck    Electronically signed by:  Karl Flores D.[facility] 12:59 PM 06/15/21

## 2021-06-15 ENCOUNTER — Encounter: Payer: Self-pay | Admitting: Physical Therapy

## 2021-06-15 ENCOUNTER — Ambulatory Visit: Payer: 59 | Admitting: Physical Therapy

## 2021-06-15 ENCOUNTER — Ambulatory Visit (INDEPENDENT_AMBULATORY_CARE_PROVIDER_SITE_OTHER): Payer: 59 | Admitting: Sports Medicine

## 2021-06-15 ENCOUNTER — Ambulatory Visit (INDEPENDENT_AMBULATORY_CARE_PROVIDER_SITE_OTHER): Payer: 59

## 2021-06-15 ENCOUNTER — Other Ambulatory Visit: Payer: Self-pay

## 2021-06-15 VITALS — BP 120/90 | HR 62 | Ht 72.0 in | Wt 224.0 lb

## 2021-06-15 DIAGNOSIS — G8929 Other chronic pain: Secondary | ICD-10-CM

## 2021-06-15 DIAGNOSIS — M25511 Pain in right shoulder: Secondary | ICD-10-CM

## 2021-06-15 DIAGNOSIS — M79601 Pain in right arm: Secondary | ICD-10-CM

## 2021-06-15 DIAGNOSIS — S29011D Strain of muscle and tendon of front wall of thorax, subsequent encounter: Secondary | ICD-10-CM | POA: Diagnosis not present

## 2021-06-15 DIAGNOSIS — M6281 Muscle weakness (generalized): Secondary | ICD-10-CM

## 2021-06-15 NOTE — Patient Instructions (Addendum)
Good to see you  Xrays on the way out  Referral for MRI  Platelet Rich Plasma (PRP)  Call to follow up after your MRI

## 2021-06-15 NOTE — Therapy (Signed)
[place]. [place], [place], [address] Phone: [phone]   Fax:  [phone]  Physical Therapy Treatment  Patient Details  Name: Karl Flores MRN: [id number] Date of Birth: [birth year] Referring Provider (PT): Glennon Mac   Encounter Date: 06/15/2021   PT End of Session - 06/15/21 1753     Visit Number 6    Number of Visits 13    Authorization Type [employer]    Authorization Time Period 05/15/21 to 06/26/21    PT Start Time 1622    PT Stop Time 1700    PT Time Calculation (min) 38 min    Activity Tolerance Patient tolerated treatment well    Behavior During Therapy [facility] for tasks assessed/performed             Past Medical History:  Diagnosis Date   GERD (gastroesophageal reflux disease)    occ    Past Surgical History:  Procedure Laterality Date   KNEE ARTHROSCOPY WITH ANTERIOR CRUCIATE LIGAMENT (ACL) REPAIR WITH HAMSTRING GRAFT Left 12/04/2016   Procedure: KNEE ARTHROSCOPY WITH ANTERIOR CRUCIATE LIGAMENT (ACL) REPAIR WITH HAMSTRING GRAFT WITH POSSIBLE POSTROLATERAL CORNER RECONSTRUCTION AND LATERAL MENISCAL REPAIR VERSUS RESECTION;  Surgeon: Meredith Pel, MD;  Location: [place];  Service: Orthopedics;  Laterality: Left;   KNEE ARTHROSCOPY WITH MENISCAL REPAIR Left 12/04/2016   Procedure: KNEE ARTHROSCOPY WITH MENISCAL REPAIR;  Surgeon: Meredith Pel, MD;  Location: [place];  Service: Orthopedics;  Laterality: Left;   WISDOM TOOTH EXTRACTION      There were no vitals filed for this visit.   Subjective Assessment - 06/15/21 1622     Subjective My doctor saw me today, he was OK with how I'm doing progress wise. He wants to do MRI and PRP shot to try to help the tendon heal    Patient Stated Goals build as much strength back up as I can    Currently in Pain? No/denies                               [facility] Adult PT Treatment/Exercise - 06/15/21 0001        Shoulder Exercises: Prone   Other Prone Exercises planks on table tapping up on BOSU blue surface 1x20; static planks on BOSU blue surface 2x30 seconds; KB 20# cradle holds to failure 44 seconds, 23 seconds, 28 seconds    Other Prone Exercises BOSU forward/backward, side to side 2x30 seconds      Shoulder Exercises: Standing   Other Standing Exercises body blade 3 way 60 seconds each; 3 way green TB thrymic stabilizations on wall;; shoulders on fire 1# forward and backwards    Other Standing Exercises KB chambers 2x10 20#                     PT Education - 06/15/21 1753     Education Details POC moving forward    Person(s) Educated Patient    Methods Explanation    Comprehension Verbalized understanding              PT Short Term Goals - 06/01/21 1609       PT SHORT TERM GOAL #1   Title Will be independent in appropriate progressive HEP    Status Achieved      PT SHORT TERM GOAL #2   Title Will experience pain as being no more than 2/10 with exercise  Status Partially Met      PT SHORT TERM GOAL #3   Title Will show at least 50% improvement in functional posture in order to improve general mechanics and shoulder girdle mobility    Status Partially Met               PT Long Term Goals - 05/15/21 1752       PT LONG TERM GOAL #1   Title Will improve MMT in weak muscles by at least one grade in order to assist in reducing pain and improving function    Time 6    Period Weeks    Status New    Target Date 06/26/21      PT LONG TERM GOAL #2   Title Will be able to perform 10 pushups with no more than 2/10 pain in R pec area    Time 6    Period Weeks    Status New      PT LONG TERM GOAL #3   Title Will be able to carry a 15# bag of groceries or a replicated baby (up to 62# weight in cradle position) in R UE without increased pain or difficulty maintaining grasp    Time 6    Period Weeks    Status New      PT LONG TERM GOAL #4   Title Will be  compliant with appropriate advanced gym based training program to improve general strength while at the same time minimizing injury to torn muscle    Time 6    Period [place] - 06/15/21 [place] arrives today doing OK, saw MD today who wants him to continue ago with PT, also going to do MRI and try PRP treatment for pec tendon. Continued working on stabilizing exercises for R shoulder and tried to challenge pec as able and tolerated. He only has a couple of sessions left before he leaves for [place] for about a month.    Personal Factors and Comorbidities Age;Fitness;Past/Current Experience;Social Background    Examination-Activity Limitations Lift;Carry;Reach Overhead    Examination-Participation Restrictions Cleaning;Community Activity;Interpersonal Relationship;Yard Work    Stability/Clinical Decision Making Stable/Uncomplicated    Designer, jewellery Low    Rehab Potential Excellent    PT Frequency 2x / week    PT Duration 6 weeks    PT Treatment/Interventions ADLs/Self Care Home Management;Cryotherapy;Electrical Stimulation;Iontophoresis 34m/ml Dexamethasone;Moist Heat;Ultrasound;Functional mobility training;Therapeutic activities;Therapeutic exercise;Patient/family education;Manual techniques;Passive range of motion;Dry needling;Energy conservation;Taping    PT Next Visit Plan continue progressing shoulder stabilizing exercises; will need formal handout of all exercises before he leaves for [place] Will plan to go on hold while he is overseas, then resume PT when he gets back    PT Home Exercise Plan blackburn 6 [id number], wall pushups with hands shoulder width apart, thoracic mobility drills    Consulted and Agree with Plan of Care Patient             Patient will benefit from skilled therapeutic intervention in order to improve the following deficits and impairments:  Impaired UE functional  use, Pain, Decreased strength, Decreased mobility, Postural dysfunction  Visit Diagnosis: Muscle weakness (generalized)  Pain in right arm     Problem List There are no problems to display for this patient.  KAnn LionsPT, DPT, PN2   Supplemental Physical  Therapist [place]    Pager [phone] Acute Rehab Office [place]. [place], [place], [address] Phone: [phone]   Fax:  [phone]  Name: Karl Flores MRN: [id number] Date of Birth: May 12, 1996

## 2021-06-19 ENCOUNTER — Other Ambulatory Visit: Payer: Self-pay

## 2021-06-19 ENCOUNTER — Ambulatory Visit (INDEPENDENT_AMBULATORY_CARE_PROVIDER_SITE_OTHER): Payer: 59

## 2021-06-19 DIAGNOSIS — G8929 Other chronic pain: Secondary | ICD-10-CM | POA: Diagnosis not present

## 2021-06-19 DIAGNOSIS — M25511 Pain in right shoulder: Secondary | ICD-10-CM

## 2021-06-19 DIAGNOSIS — S29011D Strain of muscle and tendon of front wall of thorax, subsequent encounter: Secondary | ICD-10-CM | POA: Diagnosis not present

## 2021-06-20 ENCOUNTER — Ambulatory Visit: Payer: 59 | Admitting: Physical Therapy

## 2021-06-21 ENCOUNTER — Ambulatory Visit: Payer: 59 | Attending: Sports Medicine | Admitting: Physical Therapy

## 2021-06-21 ENCOUNTER — Other Ambulatory Visit: Payer: Self-pay

## 2021-06-21 ENCOUNTER — Encounter: Payer: Self-pay | Admitting: Physical Therapy

## 2021-06-21 DIAGNOSIS — M79601 Pain in right arm: Secondary | ICD-10-CM | POA: Insufficient documentation

## 2021-06-21 DIAGNOSIS — M6281 Muscle weakness (generalized): Secondary | ICD-10-CM | POA: Diagnosis present

## 2021-06-21 NOTE — Therapy (Signed)
[place]. [place], [place], [address] Phone: [phone]   Fax:  [phone]  Physical Therapy Treatment  Patient Details  Name: Karl Flores MRN: [id number] Date of Birth: 1996/02/12 Referring Provider (PT): Glennon Mac   Encounter Date: 06/21/2021   PT End of Session - 06/21/21 1749     Visit Number 7    Authorization Type [employer]    Authorization Time Period 05/15/21 to 06/26/21    PT Start Time 1714    PT Stop Time 1800    PT Time Calculation (min) 46 min    Activity Tolerance Patient tolerated treatment well    Behavior During Therapy [facility] for tasks assessed/performed             Past Medical History:  Diagnosis Date   GERD (gastroesophageal reflux disease)    occ    Past Surgical History:  Procedure Laterality Date   KNEE ARTHROSCOPY WITH ANTERIOR CRUCIATE LIGAMENT (ACL) REPAIR WITH HAMSTRING GRAFT Left 12/04/2016   Procedure: KNEE ARTHROSCOPY WITH ANTERIOR CRUCIATE LIGAMENT (ACL) REPAIR WITH HAMSTRING GRAFT WITH POSSIBLE POSTROLATERAL CORNER RECONSTRUCTION AND LATERAL MENISCAL REPAIR VERSUS RESECTION;  Surgeon: Meredith Pel, MD;  Location: [place];  Service: Orthopedics;  Laterality: Left;   KNEE ARTHROSCOPY WITH MENISCAL REPAIR Left 12/04/2016   Procedure: KNEE ARTHROSCOPY WITH MENISCAL REPAIR;  Surgeon: Meredith Pel, MD;  Location: [place];  Service: Orthopedics;  Laterality: Left;   WISDOM TOOTH EXTRACTION      There were no vitals filed for this visit.   Subjective Assessment - 06/21/21 1720     Subjective Reports that he is doing okay, only has pain with certain pressing motions    Currently in Pain? No/denies                               [facility] Adult PT Treatment/Exercise - 06/21/21 0001       Shoulder Exercises: ROM/Strengthening   UBE (Upper Arm Bike) level 4 x 5 minutes    Lat Pull Limitations 45# 2x10    Pec Fly Limitations fly 5# from  low postion    Cybex Press Limitations 10# x10, 25# x10, then moved the hands back some for more ROM at 10# and did 10 reps, 20# with the increased ROM    Cybex Row Limitations 45# 2x10      Manual Therapy   Manual Therapy Soft tissue mobilization;Joint mobilization    Joint Mobilization right shoulder    Soft tissue mobilization pectorals              Trigger Point Dry Needling - 06/21/21 0001     Consent Given? Yes    Education Handout Provided Yes    Muscles Treated Upper Quadrant Pectoralis major    Pectoralis Major Response Twitch response elicited;Palpable increased muscle length                     PT Short Term Goals - 06/01/21 1609       PT SHORT TERM GOAL #1   Title Will be independent in appropriate progressive HEP    Status Achieved      PT SHORT TERM GOAL #2   Title Will experience pain as being no more than 2/10 with exercise    Status Partially Met      PT SHORT TERM GOAL #3   Title Will show at least  50% improvement in functional posture in order to improve general mechanics and shoulder girdle mobility    Status Partially Met               PT Long Term Goals - 06/21/21 1851       PT LONG TERM GOAL #1   Title Will improve MMT in weak muscles by at least one grade in order to assist in reducing pain and improving function    Status On-going      PT LONG TERM GOAL #2   Title Will be able to perform 10 pushups with no more than 2/10 pain in R pec area    Status On-going      PT LONG TERM GOAL #3   Title Will be able to carry a 15# bag of groceries or a replicated baby (up to 41# weight in cradle position) in R UE without increased pain or difficulty maintaining grasp    Status On-going      PT LONG TERM GOAL #4   Title Will be compliant with appropriate advanced gym based training program to improve general strength while at the same time minimizing injury to torn muscle    Status On-going                   Plan -  06/21/21 1750     Clinical Impression Statement I was able to see the x-ray and MRI impressions from the radiologist, x-ray negative, MRI shows some tendonosis of the supraspinatus mm, the pec major mm and tendon were intact as was the bicep.  I worked on gym activities as this is his main goal, I also added DN and STM to the pectoral area with good LTR's.  He is very tender in the right pectoral area.  Tolerated the gym activities well, some pain with different presses, but with verbal cues for form I could decresae the pain    PT Next Visit Plan patient will be out of [place] the next month to 6 weeks, encouraged him to talk with his MD    Consulted and Agree with Plan of Care Patient             Patient will benefit from skilled therapeutic intervention in order to improve the following deficits and impairments:  Impaired UE functional use, Pain, Decreased strength, Decreased mobility, Postural dysfunction  Visit Diagnosis: Muscle weakness (generalized)  Pain in right arm     Problem List There are no problems to display for this patient.   Sumner Boast, PT 06/21/2021, 6:53 PM  [place]. [place], [place], [address] Phone: [phone]   Fax:  [phone]  Name: Karl Flores MRN: [id number] Date of Birth: 12-30-1995

## 2021-06-21 NOTE — Patient Instructions (Signed)

## 2021-06-22 ENCOUNTER — Ambulatory Visit: Payer: 59 | Admitting: Physical Therapy

## 2021-10-09 ENCOUNTER — Telehealth: Payer: Self-pay | Admitting: Family Medicine

## 2021-10-09 ENCOUNTER — Ambulatory Visit: Payer: Medicaid Other | Admitting: Family Medicine

## 2021-10-09 NOTE — Telephone Encounter (Signed)
Pt called in earlier today to schedule an OV, but then called back to cancel within two hrs. I cancelled off the Dar and did not send a no show letter.  ?

## 2021-11-20 ENCOUNTER — Ambulatory Visit (INDEPENDENT_AMBULATORY_CARE_PROVIDER_SITE_OTHER): Payer: 59 | Admitting: Family Medicine

## 2021-11-20 ENCOUNTER — Encounter: Payer: Self-pay | Admitting: Family Medicine

## 2021-11-20 VITALS — BP 122/74 | HR 78 | Temp 97.0°F | Wt 228.0 lb

## 2021-11-20 DIAGNOSIS — K649 Unspecified hemorrhoids: Secondary | ICD-10-CM | POA: Diagnosis not present

## 2021-11-20 DIAGNOSIS — B354 Tinea corporis: Secondary | ICD-10-CM

## 2021-11-20 MED ORDER — TERBINAFINE HCL 250 MG PO TABS: 250.0000 mg | ORAL_TABLET | Freq: Every day | ORAL | 0 refills | Status: DC

## 2021-11-20 NOTE — Progress Notes (Signed)
[facility] [address] [place] [address] Dept: [phone] Dept Fax: [phone]  Office Visit  Subjective:    Patient ID: Karl Flores, male    DOB: 10-24-1995, 26 y.o..   MRN: [id number]  Chief Complaint  Patient presents with   Hemmoroids    Patient has concerns about reoccurring hemmoroid. Rash in left armpit for x 6-7 months    History of Present Illness:  Patient is in today complaining of recurrent issues with hemorrhoids. Karl Flores first developed a problem with hemorrhoids in 2020. He notes he had a very painful enlarged hemorrhoid at the time. He was seen in the ED and had this excised. He has continued to have waxing and waning issues with the hemorrhoids. These are painful at times. He is not treating these in any particular way at this point.  Also, Karl Flores notes he has recurrent rash under his arm pits. He treats this with a combination of Lotrimin cream and a nystatin powder. He finds this improves the issue, but does not totally resolve it.  Past Medical History: There are no problems to display for this patient.  Past Surgical History:  Procedure Laterality Date   KNEE ARTHROSCOPY WITH ANTERIOR CRUCIATE LIGAMENT (ACL) REPAIR WITH HAMSTRING GRAFT Left 12/04/2016   Procedure: KNEE ARTHROSCOPY WITH ANTERIOR CRUCIATE LIGAMENT (ACL) REPAIR WITH HAMSTRING GRAFT WITH POSSIBLE POSTROLATERAL CORNER RECONSTRUCTION AND LATERAL MENISCAL REPAIR VERSUS RESECTION;  Surgeon: Karl Copa, MD;  Location: [facility] OR;  Service: Orthopedics;  Laterality: Left;   KNEE ARTHROSCOPY WITH MENISCAL REPAIR Left 12/04/2016   Procedure: KNEE ARTHROSCOPY WITH MENISCAL REPAIR;  Surgeon: Karl Copa, MD;  Location: [facility] OR;  Service: Orthopedics;  Laterality: Left;   WISDOM TOOTH EXTRACTION     Family History  Problem Relation Age of Onset   Diabetes Father    Hyperlipidemia Father    Hypertension Father    Heart disease  Father    Stroke Paternal Grandmother    Diabetes Paternal Grandfather    Heart disease Paternal Grandfather    Outpatient Medications Prior to Visit  Medication Sig Dispense Refill   magnesium 30 MG tablet Take 30 mg by mouth daily.     No facility-administered medications prior to visit.   Allergies  Allergen Reactions   Penicillins Other (See Comments)    Has patient had a PCN reaction causing immediate rash, facial/tongue/throat swelling, SOB or lightheadedness with hypotension: Unknown Has patient had a PCN reaction causing severe rash involving mucus membranes or skin necrosis: Unknown Has patient had a PCN reaction that required hospitalization: Unknown Has patient had a PCN reaction occurring within the last 10 years: No Unknown Childhood reaction  If all of the above answers are "NO", then may proceed with Cephalosporin use.       Objective:   Today's Vitals   11/20/21 1615  BP: 122/74  Pulse: 78  Temp: (!) 97 F (36.1 C)  TempSrc: Temporal  SpO2: 98%  Weight: 228 lb (103.4 kg)   Body mass index is 30.92 kg/m.   General: Well developed, well nourished. No acute distress. Skin: Warm and dry. There is an erythematous, well demarcated rash in the left axilla with a scaly border and   central clearing. There is a small area of similar rash in the right axilla. Psych: Alert and oriented. Normal mood and affect.  Health Maintenance Due  Topic Date Due   COVID-19 Vaccine (1) Never done   HPV VACCINES (  1 - Male 2-dose series) Never done     Assessment & Plan:   1. Hemorrhoids, unspecified hemorrhoid type I reviewed the prior ED note from [facility] in 2020. He had a thrombosed external hemorrhoid at the time, which was incised and evacuated. We discussed prevention of hemorrhoids by reducing straining with bowel movements. I recommend he increase daily water intake, increase plant-based foods in diet, reduce red meat, consider a fiber supplement, and use  docusate to soften stools. I will also refer him for further evaluation for surgical options for this.  - Ambulatory referral to General Surgery  2. Tinea corporis Rash is consistent with tinea. As he has been refractory to azoles, I will try him on a course of Lamisil  - terbinafine (LAMISIL) 250 MG tablet; Take 1 tablet (250 mg total) by mouth daily.  Dispense: 14 tablet; Refill: 0  Return if symptoms worsen or fail to improve.   Karl Mast, MD

## 2021-11-20 NOTE — Patient Instructions (Signed)

## 2022-03-29 ENCOUNTER — Ambulatory Visit (INDEPENDENT_AMBULATORY_CARE_PROVIDER_SITE_OTHER): Payer: Commercial Managed Care - HMO | Admitting: Family Medicine

## 2022-03-29 ENCOUNTER — Encounter: Payer: Self-pay | Admitting: Family Medicine

## 2022-03-29 VITALS — BP 112/68 | HR 53 | Temp 97.1°F | Ht 72.0 in | Wt 228.8 lb

## 2022-03-29 DIAGNOSIS — Z8719 Personal history of other diseases of the digestive system: Secondary | ICD-10-CM

## 2022-03-29 DIAGNOSIS — Z8379 Family history of other diseases of the digestive system: Secondary | ICD-10-CM | POA: Diagnosis not present

## 2022-03-29 DIAGNOSIS — K921 Melena: Secondary | ICD-10-CM

## 2022-03-29 LAB — COMPREHENSIVE METABOLIC PANEL
ALT: 12 U/L (ref 0–53)
AST: 15 U/L (ref 0–37)
Albumin: 4.6 g/dL (ref 3.5–5.2)
Alkaline Phosphatase: 40 U/L (ref 39–117)
BUN: 14 mg/dL (ref 6–23)
CO2: 29 mEq/L (ref 19–32)
Calcium: 9.3 mg/dL (ref 8.4–10.5)
Chloride: 105 mEq/L (ref 96–112)
Creatinine, Ser: 0.93 mg/dL (ref 0.40–1.50)
GFR: 113.01 mL/min (ref >60.00)
Glucose, Bld: 88 mg/dL (ref 70–99)
Potassium: 4.1 mEq/L (ref 3.5–5.1)
Sodium: 140 mEq/L (ref 135–145)
Total Bilirubin: 0.3 mg/dL (ref 0.2–1.2)
Total Protein: 6.9 g/dL (ref 6.0–8.3)

## 2022-03-29 LAB — CBC WITH DIFFERENTIAL/PLATELET
Basophils Absolute: 0 10*3/uL (ref 0.0–0.1)
Basophils Relative: 0.4 % (ref 0.0–3.0)
Eosinophils Absolute: 0.2 10*3/uL (ref 0.0–0.7)
Eosinophils Relative: 5.3 % — ABNORMAL HIGH (ref 0.0–5.0)
HCT: 41.3 % (ref 39.0–52.0)
Hemoglobin: 13.7 g/dL (ref 13.0–17.0)
Lymphocytes Relative: 47 % — ABNORMAL HIGH (ref 12.0–46.0)
Lymphs Abs: 1.8 10*3/uL (ref 0.7–4.0)
MCHC: 33.3 g/dL (ref 30.0–36.0)
MCV: 85.5 fl (ref 78.0–100.0)
Monocytes Absolute: 0.3 10*3/uL (ref 0.1–1.0)
Monocytes Relative: 8.9 % (ref 3.0–12.0)
Neutro Abs: 1.4 10*3/uL (ref 1.4–7.7)
Neutrophils Relative %: 38.4 % — ABNORMAL LOW (ref 43.0–77.0)
Platelets: 213 10*3/uL (ref 150.0–400.0)
RBC: 4.83 Mil/uL (ref 4.22–5.81)
RDW: 14.1 % (ref 11.5–15.5)
WBC: 3.7 10*3/uL — ABNORMAL LOW (ref 4.0–10.5)

## 2022-03-29 NOTE — Progress Notes (Signed)
Established Patient Office Visit  Subjective   Patient ID: Karl Flores, male    DOB: 18-Mar-1996  Age: 26 y.o. MRN: [id number]  Chief Complaint  Patient presents with   Blood In Stools    Continued blood in stool with lots of stomach pains x 8 month. Stool yellow in color.     HPI reports hematochezia 2-3 times a week over the last several months.  There is no evidence for weight loss through review of chart.  He suffers from chronic constipation that he treats with mag wheezing.  Says that MiraLAX tends to constipate him.  Denies prolonged toilet bowl sitting.  History of internal and external hemorrhoids that have been treated surgically in the past.  Denies melena.  He has a brother with Crohn's disease.  He does become bloated after consuming dairy products.    Review of Systems  Constitutional: Negative.   HENT: Negative.    Eyes:  Negative for blurred vision, discharge and redness.  Respiratory: Negative.    Cardiovascular: Negative.   Gastrointestinal:  Positive for blood in stool and constipation. Negative for abdominal pain, diarrhea and melena.  Genitourinary: Negative.   Musculoskeletal: Negative.  Negative for myalgias.  Skin:  Negative for rash.  Neurological:  Negative for tingling, loss of consciousness and weakness.  Endo/Heme/Allergies:  Negative for polydipsia.      Objective:     BP 112/68 (BP Location: Right Arm, Patient Position: Sitting, Cuff Size: Large)   Pulse (!) 53   Temp (!) 97.1 F (36.2 C) (Temporal)   Ht 6' (1.829 m)   Wt 228 lb 12.8 oz (103.8 kg)   SpO2 98%   BMI 31.03 kg/m  Wt Readings from Last 3 Encounters:  03/29/22 228 lb 12.8 oz (103.8 kg)  11/20/21 228 lb (103.4 kg)  06/15/21 224 lb (101.6 kg)      Physical Exam Constitutional:      General: He is not in acute distress.    Appearance: Normal appearance. He is not ill-appearing, toxic-appearing or diaphoretic.  HENT:     Head: Normocephalic and atraumatic.     Right Ear:  External ear normal.     Left Ear: External ear normal.  Eyes:     General: No scleral icterus.       Right eye: No discharge.        Left eye: No discharge.     Extraocular Movements: Extraocular movements intact.     Conjunctiva/sclera: Conjunctivae normal.     Pupils: Pupils are equal, round, and reactive to light.  Cardiovascular:     Rate and Rhythm: Normal rate and regular rhythm.  Pulmonary:     Effort: Pulmonary effort is normal. No respiratory distress.     Breath sounds: Normal breath sounds.  Abdominal:     General: Bowel sounds are normal. There is no distension.     Tenderness: There is no abdominal tenderness. There is no guarding.  Genitourinary:    Prostate: Not enlarged, not tender and no nodules present.     Rectum: Guaiac result negative. No mass, tenderness, anal fissure, external hemorrhoid or internal hemorrhoid. Normal anal tone.  Musculoskeletal:     Cervical back: No rigidity or tenderness.  Skin:    General: Skin is warm and dry.  Neurological:     Mental Status: He is alert and oriented to person, place, and time.  Psychiatric:        Mood and Affect: Mood normal.  Behavior: Behavior normal.      No results found for any visits on 03/29/22.    The ASCVD Risk score (Arnett DK, et al., 2019) failed to calculate for the following reasons:   The 2019 ASCVD risk score is only valid for ages 82 to 61    Assessment & Plan:   Problem List Items Addressed This Visit   None Visit Diagnoses     Hematochezia    -  Primary   Relevant Orders   CBC with Differential/Platelet   Comprehensive metabolic panel   Ambulatory referral to Gastroenterology   Family history of Crohn's disease       Relevant Orders   Ambulatory referral to Gastroenterology   History of constipation       Relevant Orders   Ambulatory referral to Gastroenterology   History of hemorrhoids       Relevant Orders   Ambulatory referral to Gastroenterology       Return  Follow-up with Dr. Veto Kemps..  Referral to GI.  Information was given on lower GI bleeding and the FODMAP diet.  Mliss Sax, MD

## 2022-04-09 ENCOUNTER — Ambulatory Visit (INDEPENDENT_AMBULATORY_CARE_PROVIDER_SITE_OTHER): Payer: Commercial Managed Care - HMO | Admitting: Gastroenterology

## 2022-04-09 ENCOUNTER — Encounter: Payer: Self-pay | Admitting: Gastroenterology

## 2022-04-09 VITALS — BP 110/88 | HR 86 | Ht 72.0 in | Wt 226.2 lb

## 2022-04-09 DIAGNOSIS — Z8379 Family history of other diseases of the digestive system: Secondary | ICD-10-CM

## 2022-04-09 DIAGNOSIS — K921 Melena: Secondary | ICD-10-CM | POA: Diagnosis not present

## 2022-04-09 MED ORDER — NA SULFATE-K SULFATE-MG SULF 17.5-3.13-1.6 GM/177ML PO SOLN: 1.0000 | Freq: Once | ORAL | 0 refills | Status: AC

## 2022-04-09 NOTE — Progress Notes (Signed)
HPI : Karl Flores is a very pleasant 26 year old male with no chronic medical history who is referred to [place] by Dr. Arlester Marker for further evaluation of hematochezia.  The patient first started seeing blood in his stool in February of this year.  He does have a history of a thrombosed external hemorrhoid in 2020, but denies having any blood in his stool around then and denies having hemorrhoidal symptoms since then until this February.  He sees blood in the stool fairly frequently which is described as bright red in color and sometimes moderate in quality.  He sometimes has perianal pain/discomfort and itching.  He describes symptoms consistent with prolapsing internal hemorrhoids which reduce spontaneously.  He denies significant pain with the passage of stool. He has occasional bouts of lower abdominal pain which are not frequent, but can be distressing. He has regular bowel movements, typically 2-3 per day, with stools that are typically poorly formed in consistency ('mushy'), but is also bothered by episodic constipation, characterized by infrequent hard stools that are difficult to pass.  The blood is not necessarily more noticeable with the hard stools compared to the soft stools.  Diarrhea is not a typical problem for him. He tried taking metamucil for about a week or so, but stopped because it didn't seem to help his bowel movements, and it caused worsening bloating. He has a brother with ulcerative colitis.   History reviewed. No pertinent past medical history.   Past Surgical History:  Procedure Laterality Date   KNEE ARTHROSCOPY WITH ANTERIOR CRUCIATE LIGAMENT (ACL) REPAIR WITH HAMSTRING GRAFT Left 12/04/2016   Procedure: KNEE ARTHROSCOPY WITH ANTERIOR CRUCIATE LIGAMENT (ACL) REPAIR WITH HAMSTRING GRAFT WITH POSSIBLE POSTROLATERAL CORNER RECONSTRUCTION AND LATERAL MENISCAL REPAIR VERSUS RESECTION;  Surgeon: Meredith Pel, MD;  Location: [place] [place];  Service: Orthopedics;  Laterality:  Left;   KNEE ARTHROSCOPY WITH MENISCAL REPAIR Left 12/04/2016   Procedure: KNEE ARTHROSCOPY WITH MENISCAL REPAIR;  Surgeon: Meredith Pel, MD;  Location: [place];  Service: Orthopedics;  Laterality: Left;   WISDOM TOOTH EXTRACTION     Family History  Problem Relation Age of Onset   Diabetes Father    Hyperlipidemia Father    Hypertension Father    Heart disease Father    Ulcerative colitis Brother    Stroke Paternal Grandmother    Diabetes Paternal Grandfather    Heart disease Paternal Grandfather    Social History   Tobacco Use   Smoking status: Never   Smokeless tobacco: Never   Tobacco comments:    hookah once a week  Vaping Use   Vaping Use: Never used  Substance Use Topics   Alcohol use: No   Drug use: No   Current Outpatient Medications  Medication Sig Dispense Refill   magnesium 30 MG tablet Take 30 mg by mouth daily. (Patient not taking: Reported on 04/09/2022)     terbinafine (LAMISIL) 250 MG tablet Take 1 tablet (250 mg total) by mouth daily. (Patient not taking: Reported on 03/29/2022) 14 tablet 0   No current facility-administered medications for this visit.   Allergies  Allergen Reactions   Penicillins Other (See Comments)    Has patient had a PCN reaction causing immediate rash, facial/tongue/throat swelling, SOB or lightheadedness with hypotension: Unknown Has patient had a PCN reaction causing severe rash involving mucus membranes or skin necrosis: Unknown Has patient had a PCN reaction that required hospitalization: Unknown Has patient had a PCN reaction occurring within the last 10 years:  No Unknown Childhood reaction  If all of the above answers are "NO", then may proceed with Cephalosporin use.       Review of Systems: All systems reviewed and negative except where noted in HPI.    No results found.  Physical Exam: BP 110/88   Pulse 86   Ht 6' (1.829 m)   Wt 226 lb 4 oz (102.6 kg)   BMI 30.69 kg/m  Constitutional:  Pleasant,well-developed, [place] male in no acute distress. HEENT: Normocephalic and atraumatic. Conjunctivae are normal. No scleral icterus. Neck supple.  Cardiovascular: Normal rate, regular rhythm.  Pulmonary/chest: Effort normal and breath sounds normal. No wheezing, rales or rhonchi. Abdominal: Soft, nondistended, nontender. Bowel sounds active throughout. There are no masses palpable. No hepatomegaly. Extremities: no edema Rectal:  Deferred until time of colonoscopy Neurological: Alert and oriented to person place and time. Skin: Skin is warm and dry. No rashes noted. Psychiatric: Normal mood and affect. Behavior is normal.  CBC    Component Value Date/Time   WBC 3.7 (L) 03/29/2022 1008   RBC 4.83 03/29/2022 1008   HGB 13.7 03/29/2022 1008   HCT 41.3 03/29/2022 1008   PLT 213.0 03/29/2022 1008   MCV 85.5 03/29/2022 1008   MCH 28.2 04/14/2017 0450   MCHC 33.3 03/29/2022 1008   RDW 14.1 03/29/2022 1008   LYMPHSABS 1.8 03/29/2022 1008   MONOABS 0.3 03/29/2022 1008   EOSABS 0.2 03/29/2022 1008   BASOSABS 0.0 03/29/2022 1008    CMP     Component Value Date/Time   NA 140 03/29/2022 1008   K 4.1 03/29/2022 1008   CL 105 03/29/2022 1008   CO2 29 03/29/2022 1008   GLUCOSE 88 03/29/2022 1008   BUN 14 03/29/2022 1008   CREATININE 0.93 03/29/2022 1008   CALCIUM 9.3 03/29/2022 1008   PROT 6.9 03/29/2022 1008   ALBUMIN 4.6 03/29/2022 1008   AST 15 03/29/2022 1008   ALT 12 03/29/2022 1008   ALKPHOS 40 03/29/2022 1008   BILITOT 0.3 03/29/2022 1008   GFRNONAA >60 04/14/2017 0450   GFRAA >60 04/14/2017 0450     ASSESSMENT AND PLAN: 26 year old male with several months of recurrent hematochezia with occasional abdominal pain and constipation.  Although his bleeding seems most consistent with hemorrhoidal bleeding, a colonoscopy to exclude other causes such as IBD and mass lesion/polyp is reasonable, especially given the family history of ulcerative colitis. We  discussed the anatomy of hemorrhoids and the principles of hemorrhoid management to include optimization of bowel habits and stool consistency, as well as the role of hemorrhoid banding.  The patient is potentially interested in banding.  Hematochezia, likely hemorrhoidal - Colonoscopy to exclude IBD/mass lesion/polyp  Hemorrhoids - Consider banding - Discuss further management following colonoscopy  The details, risks (including bleeding, perforation, infection, missed lesions, medication reactions and possible hospitalization or surgery if complications occur), benefits, and alternatives to colonoscopy with possible biopsy and possible polypectomy were discussed with the patient and he consents to proceed.   Ceili Boshers E. Tomasa Rand, MD [facility]   CC:  Loyola Mast, MD

## 2022-04-09 NOTE — Patient Instructions (Addendum)
_______________________________________________________  If you are age 27 or older, your body mass index should be between 23-30. Your Body mass index is 30.69 kg/m. If this is out of the aforementioned range listed, please consider follow up with your Primary Care Provider.  If you are age 44 or younger, your body mass index should be between 19-25. Your Body mass index is 30.69 kg/m. If this is out of the aformentioned range listed, please consider follow up with your Primary Care Provider.   You have been scheduled for a colonoscopy. Please follow written instructions given to you at your visit today.  Please pick up your prep supplies at the pharmacy within the next 1-3 days. If you use inhalers (even only as needed), please bring them with you on the day of your procedure.  The [facility] providers would like to encourage you to use [facility] to communicate with providers for non-urgent requests or questions.  Due to long hold times on the telephone, sending your provider a message by [facility] may be a faster and more efficient way to get a response.  Please allow 48 business hours for a response.  Please remember that this is for non-urgent requests.   It was a pleasure to see you today!  Thank you for trusting me with your gastrointestinal care!

## 2022-04-11 ENCOUNTER — Encounter: Payer: Medicaid Other | Admitting: Gastroenterology

## 2022-04-16 ENCOUNTER — Encounter: Payer: Self-pay | Admitting: Gastroenterology

## 2022-04-18 ENCOUNTER — Ambulatory Visit (AMBULATORY_SURGERY_CENTER): Payer: Commercial Managed Care - HMO | Admitting: Gastroenterology

## 2022-04-18 ENCOUNTER — Encounter: Payer: Self-pay | Admitting: Gastroenterology

## 2022-04-18 VITALS — BP 111/60 | HR 46 | Temp 97.3°F | Resp 11 | Ht 72.0 in | Wt 226.0 lb

## 2022-04-18 DIAGNOSIS — K921 Melena: Secondary | ICD-10-CM

## 2022-04-18 DIAGNOSIS — K641 Second degree hemorrhoids: Secondary | ICD-10-CM | POA: Diagnosis not present

## 2022-04-18 MED ORDER — SODIUM CHLORIDE 0.9 % IV SOLN: 500.0000 mL | Freq: Once | INTRAVENOUS | Status: DC

## 2022-04-18 NOTE — Progress Notes (Signed)
History and Physical Interval Note:  04/18/2022 9:37 AM  Karl Flores  has presented today for endoscopic procedure(s), with the diagnosis of  Encounter Diagnosis  Name Primary?   Hematochezia Yes  .  The various methods of evaluation and treatment have been discussed with the patient and/or family. After consideration of risks, benefits and other options for treatment, the patient has consented to  the endoscopic procedure(s).   The patient's history has been reviewed, patient examined, no change in status, stable for endoscopic procedure(s).  I have reviewed the patient's chart and labs.  Questions were answered to the patient's satisfaction.     Claudene Gatliff E. Candis Schatz, MD [facility]

## 2022-04-18 NOTE — Progress Notes (Signed)
Report to PACU, RN, vss, BBS= Clear.  

## 2022-04-18 NOTE — Op Note (Signed)
[facility] Patient Name: Karl Flores Procedure Date: 04/18/2022 9:43 AM MRN: [id number] Endoscopist: Lorin Picket E. Tomasa Rand , MD, [id number] Age: 26 Referring MD:  Date of Birth: [birth year] Gender: Male Account #: [id number] Procedure:                Colonoscopy Indications:              Hematochezia Medicines:                Monitored Anesthesia Care Procedure:                Pre-Anesthesia Assessment:                           - Prior to the procedure, a History and Physical                            was performed, and patient medications and                            allergies were reviewed. The patient's tolerance of                            previous anesthesia was also reviewed. The risks                            and benefits of the procedure and the sedation                            options and risks were discussed with the patient.                            All questions were answered, and informed consent                            was obtained. Prior Anticoagulants: The patient has                            taken no anticoagulant or antiplatelet agents. ASA                            Grade Assessment: II - A patient with mild systemic                            disease. After reviewing the risks and benefits,                            the patient was deemed in satisfactory condition to                            undergo the procedure.                           After obtaining informed consent, the colonoscope  was passed under direct vision. Throughout the                            procedure, the patient's blood pressure, pulse, and                            oxygen saturations were monitored continuously. The                            Olympus CF-HQ190L (Serial# [id number]) Colonoscope was                            introduced through the anus and advanced to the the                            terminal ileum, with identification of the                             appendiceal orifice and IC valve. The colonoscopy                            was performed without difficulty. The patient                            tolerated the procedure well. The quality of the                            bowel preparation was good. The terminal ileum,                            ileocecal valve, appendiceal orifice, and rectum                            were photographed. The bowel preparation used was                            SUPREP via split dose instruction. Scope In: 9:49:13 AM Scope Out: 9:59:37 AM Scope Withdrawal Time: 0 hours 7 minutes 9 seconds  Total Procedure Duration: 0 hours 10 minutes 24 seconds  Findings:                 Hemorrhoids were found on perianal exam.                           The digital rectal exam was normal. Pertinent                            negatives include normal sphincter tone and no                            palpable rectal lesions.                           The colon (entire examined portion) appeared normal.  The terminal ileum appeared normal.                           Non-bleeding internal hemorrhoids were found during                            retroflexion. The hemorrhoids were Grade I                            (internal hemorrhoids that do not prolapse).                           No additional abnormalities were found on                            retroflexion. Complications:            No immediate complications. Estimated Blood Loss:     Estimated blood loss: none. Impression:               - Hemorrhoids found on perianal exam.                           - The entire examined colon is normal.                           - The examined portion of the ileum was normal.                           - Non-bleeding internal hemorrhoids. This is the                            source of the patient's hematochezia                           - No specimens  collected. Recommendation:           - Patient has a contact number available for                            emergencies. The signs and symptoms of potential                            delayed complications were discussed with the                            patient. Return to normal activities tomorrow.                            Written discharge instructions were provided to the                            patient.                           - Resume previous diet.                           -  Continue present medications.                           - Repeat colonoscopy at age 32 for screening                            purposes.                           - Recommend high fiber diet or daily fiber                            supplementation to reduce hemorrhoidal symptoms. Saleem Coccia E. Tomasa Rand, MD 04/18/2022 10:05:21 AM This report has been signed electronically.

## 2022-04-18 NOTE — Progress Notes (Signed)
Pt's states no medical or surgical changes since previsit or office visit. 

## 2022-04-18 NOTE — Progress Notes (Deleted)
Called to room to assist during endoscopic procedure.  Patient ID and intended procedure confirmed with present staff. Received instructions for my participation in the procedure from the performing physician.  

## 2022-04-18 NOTE — Patient Instructions (Signed)
Information on hemorrhoids given to you today.  Resume previous diet and medications.  Repeat colonoscopy in 10 years for screening purposes.   YOU HAD AN ENDOSCOPIC PROCEDURE TODAY AT [facility]:   Refer to the procedure report that was given to you for any specific questions about what was found during the examination.  If the procedure report does not answer your questions, please call your gastroenterologist to clarify.  If you requested that your care partner not be given the details of your procedure findings, then the procedure report has been included in a sealed envelope for you to review at your convenience later.  YOU SHOULD EXPECT: Some feelings of bloating in the abdomen. Passage of more gas than usual.  Walking can help get rid of the air that was put into your GI tract during the procedure and reduce the bloating. If you had a lower endoscopy (such as a colonoscopy or flexible sigmoidoscopy) you may notice spotting of blood in your stool or on the toilet paper. If you underwent a bowel prep for your procedure, you may not have a normal bowel movement for a few days.  Please Note:  You might notice some irritation and congestion in your nose or some drainage.  This is from the oxygen used during your procedure.  There is no need for concern and it should clear up in a day or so.  SYMPTOMS TO REPORT IMMEDIATELY:   Following upper endoscopy (EGD)  Vomiting of blood or coffee ground material  New chest pain or pain under the shoulder blades  Painful or persistently difficult swallowing  New shortness of breath  Fever of 100F or higher  Black, tarry-looking stools  For urgent or emergent issues, a gastroenterologist can be reached at any hour by calling [phone]. Do not use MyChart messaging for urgent concerns.    DIET:  We do recommend a small meal at first, but then you may proceed to your regular diet.  Drink plenty of fluids but you should avoid  alcoholic beverages for 24 hours.  ACTIVITY:  You should plan to take it easy for the rest of today and you should NOT DRIVE or use heavy machinery until tomorrow (because of the sedation medicines used during the test).    FOLLOW UP: Our staff will call the number listed on your records the next business day following your procedure.  We will call around 7:15- 8:00 am to check on you and address any questions or concerns that you may have regarding the information given to you following your procedure. If we do not reach you, we will leave a message.     If any biopsies were taken you will be contacted by phone or by letter within the next 1-3 weeks.  Please call us at [phone] if you have not heard about the biopsies in 3 weeks.    SIGNATURES/CONFIDENTIALITY: You and/or your care partner have signed paperwork which will be entered into your electronic medical record.  These signatures attest to the fact that that the information above on your After Visit Summary has been reviewed and is understood.  Full responsibility of the confidentiality of this discharge information lies with you and/or your care-partner.

## 2022-04-19 ENCOUNTER — Telehealth: Payer: Self-pay | Admitting: *Deleted

## 2022-04-19 NOTE — Telephone Encounter (Signed)
  Follow up Call-     04/18/2022    8:48 AM  Call back number  Post procedure Call Back phone  # [phone]  Permission to leave phone message Yes     Patient questions:  Do you have a fever, pain , or abdominal swelling? No. Pain Score  0 *  Have you tolerated food without any problems? Yes.    Have you been able to return to your normal activities? Yes.    Do you have any questions about your discharge instructions: Diet   No. Medications  No. Follow up visit  No.  Do you have questions or concerns about your Care? No.  Actions: * If pain score is 4 or above: No action needed, pain <4.

## 2022-08-02 ENCOUNTER — Encounter (HOSPITAL_BASED_OUTPATIENT_CLINIC_OR_DEPARTMENT_OTHER): Payer: Self-pay | Admitting: Urology

## 2022-08-02 ENCOUNTER — Emergency Department (HOSPITAL_BASED_OUTPATIENT_CLINIC_OR_DEPARTMENT_OTHER): Payer: Commercial Managed Care - HMO

## 2022-08-02 ENCOUNTER — Other Ambulatory Visit: Payer: Self-pay

## 2022-08-02 ENCOUNTER — Emergency Department (HOSPITAL_BASED_OUTPATIENT_CLINIC_OR_DEPARTMENT_OTHER)
Admission: EM | Admit: 2022-08-02 | Discharge: 2022-08-02 | Disposition: A | Discharge status: Home / Self Care | Payer: Commercial Managed Care - HMO | Attending: Emergency Medicine | Admitting: Emergency Medicine

## 2022-08-02 DIAGNOSIS — R079 Chest pain, unspecified: Secondary | ICD-10-CM | POA: Diagnosis present

## 2022-08-02 DIAGNOSIS — R0789 Other chest pain: Secondary | ICD-10-CM | POA: Insufficient documentation

## 2022-08-02 LAB — TROPONIN I (HIGH SENSITIVITY)
Troponin I (High Sensitivity): 5 ng/L (ref <18)
Troponin I (High Sensitivity): 5 ng/L (ref <18)

## 2022-08-02 LAB — CBC
HCT: 43.1 % (ref 39.0–52.0)
Hemoglobin: 14 g/dL (ref 13.0–17.0)
MCH: 28 pg (ref 26.0–34.0)
MCHC: 32.5 g/dL (ref 30.0–36.0)
MCV: 86.2 fL (ref 80.0–100.0)
Platelets: 224 10*3/uL (ref 150–400)
RBC: 5 MIL/uL (ref 4.22–5.81)
RDW: 13.2 % (ref 11.5–15.5)
WBC: 3.8 10*3/uL — ABNORMAL LOW (ref 4.0–10.5)
nRBC: 0 % (ref 0.0–0.2)

## 2022-08-02 LAB — BASIC METABOLIC PANEL
Anion gap: 6 (ref 5–15)
BUN: 14 mg/dL (ref 6–20)
CO2: 27 mmol/L (ref 22–32)
Calcium: 9 mg/dL (ref 8.9–10.3)
Chloride: 103 mmol/L (ref 98–111)
Creatinine, Ser: 0.83 mg/dL (ref 0.61–1.24)
GFR, Estimated: >60 mL/min (ref >60)
Glucose, Bld: 89 mg/dL (ref 70–99)
Potassium: 4.2 mmol/L (ref 3.5–5.1)
Sodium: 136 mmol/L (ref 135–145)

## 2022-08-02 LAB — D-DIMER, QUANTITATIVE: D-Dimer, Quant: <0.27 ug/mL-FEU (ref 0.00–0.50)

## 2022-08-02 MED ORDER — IBUPROFEN 800 MG PO TABS: 800.0000 mg | ORAL_TABLET | Freq: Three times a day (TID) | ORAL | 0 refills | Status: DC | PRN

## 2022-08-02 NOTE — ED Triage Notes (Signed)
Pt states left sided chest pain worse with sleeping x 2 weeks  Reports some related SOB only when laying down  No pain at this time

## 2022-08-02 NOTE — ED Notes (Signed)
Discharge paperwork reviewed entirely with patient, including Rx's and follow up care. Pain was under control. Pt verbalized understanding as well as all parties involved. No questions or concerns voiced at the time of discharge. No acute distress noted.   Pt ambulated out to PVA without incident or assistance.

## 2022-08-02 NOTE — ED Provider Notes (Signed)
Emergency Department Provider Note   I have reviewed the triage vital signs and the nursing notes.   HISTORY  Chief Complaint Chest Pain   HPI Karl Flores is a 27 y.o. male with no significant past medical presents to the emergency department for evaluation of intermittent chest discomfort.  Symptoms are worse with lying flat often keep him awake at night.  Symptoms are worse when he lays on his left side.  When he is having pain he is feeling shortness of breath although not at baseline.  No active chest pain while sitting up.  No fevers, chills, recent viral infections.  No vomiting or abdominal pain.  No pleuritic pain but he did return from a trip [place] 2 weeks ago.  Denies any leg pain or swelling.   History reviewed. No pertinent past medical history.  Review of Systems  Constitutional: No fever/chills Eyes: No visual changes. ENT: No sore throat. Cardiovascular: Positive chest pain. Respiratory: Denies shortness of breath. Gastrointestinal: No abdominal pain.  No nausea, no vomiting.  No diarrhea.  No constipation. Genitourinary: Negative for dysuria. Musculoskeletal: Negative for back pain. Skin: Negative for rash. Neurological: Negative for headaches, focal weakness or   ____________________________________________   PHYSICAL EXAM:  VITAL SIGNS: ED Triage Vitals  Enc Vitals Group     BP 08/02/22 1710 138/81     Pulse Rate 08/02/22 1710 73     Resp 08/02/22 1710 18     Temp 08/02/22 1710 98.3 F (36.8 C)     Temp Source 08/02/22 1710 Oral     SpO2 08/02/22 1710 100 %     Weight 08/02/22 1709 225 lb 15.5 oz (102.5 kg)     Height 08/02/22 1709 6' (1.829 m)   Constitutional: Alert and oriented. Well appearing and in no acute distress. Eyes: Conjunctivae are normal.  Head: Atraumatic. Nose: No congestion/rhinnorhea. Mouth/Throat: Mucous membranes are moist.   Neck: No stridor.  Cardiovascular: Normal rate, regular rhythm. Good peripheral circulation.  Grossly normal heart sounds.   Respiratory: Normal respiratory effort.  No retractions. Lungs CTAB. Gastrointestinal: Soft and nontender. No distention.  Musculoskeletal: No lower extremity tenderness nor edema. No gross deformities of extremities. Neurologic:  Normal speech and language. No gross focal neurologic deficits are appreciated.  Skin:  Skin is warm, dry and intact. No rash noted.  ____________________________________________   LABS (all labs ordered are listed, but only abnormal results are displayed)  Labs Reviewed  CBC - Abnormal; Notable for the following components:      Result Value   WBC 3.8 (*)    All other components within normal limits  BASIC METABOLIC PANEL  D-DIMER, QUANTITATIVE  TROPONIN I (HIGH SENSITIVITY)  TROPONIN I (HIGH SENSITIVITY)   ____________________________________________  EKG   EKG Interpretation  Date/Time:  Thursday August 02 2022 17:15:34 EST Ventricular Rate:  84 PR Interval:  142 QRS Duration: 91 QT Interval:  363 QTC Calculation: 430 R Axis:   86 Text Interpretation: Sinus rhythm Confirmed by Nanda Quinton [phone]) on 08/02/2022 5:17:50 PM        ____________________________________________  RADIOLOGY  DG Chest 2 View  Result Date: 08/02/2022 CLINICAL DATA:  Chest pain off and on for 2 weeks. EXAM: CHEST - 2 VIEW COMPARISON:  04/14/2017 FINDINGS: The heart size and mediastinal contours are within normal limits. Both lungs are clear. The visualized skeletal structures are unremarkable. IMPRESSION: No active cardiopulmonary disease. Electronically Signed   By: Lucienne Capers M.D.   On: 08/02/2022 17:48  ____________________________________________   PROCEDURES  Procedure(s) performed:   Procedures  None ____________________________________________   INITIAL IMPRESSION / ASSESSMENT AND PLAN / ED COURSE  Pertinent labs & imaging results that were available during my care of the patient were reviewed by me and  considered in my medical decision making (see chart for details).   This patient is Presenting for Evaluation of CP, which does require a range of treatment options, and is a complaint that involves a high risk of morbidity and mortality.  The Differential Diagnoses includes but is not exclusive to acute coronary syndrome, aortic dissection, pulmonary embolism, cardiac tamponade, community-acquired pneumonia, pericarditis, musculoskeletal chest wall pain, etc.    Clinical Laboratory Tests Ordered, included troponin and d dimer negative. No AKI.   Radiologic Tests Ordered, included CXR. I independently interpreted the images and agree with radiology interpretation.   Cardiac Monitor Tracing which shows NSR.    Social Determinants of Health Risk patient is a non-smoker.   Medical Decision Making: Summary:  Patient presents to the ED with CP. Workup here reassuring.   Reevaluation with update and discussion with patient. Discussed labs and follow up plan with PCP.   Considered admission but low risk by HEART score and negative troponin.   Patient's presentation is most consistent with acute presentation with potential threat to life or bodily function.   Disposition: discharge  ____________________________________________  FINAL CLINICAL IMPRESSION(S) / ED DIAGNOSES  Final diagnoses:  Atypical chest pain     NEW OUTPATIENT MEDICATIONS STARTED DURING THIS VISIT:  Discharge Medication List as of 08/02/2022  8:41 PM     START taking these medications   Details  ibuprofen (ADVIL) 800 MG tablet Take 1 tablet (800 mg total) by mouth every 8 (eight) hours as needed for moderate pain., Starting Thu 08/02/2022, Normal        Note:  This document was prepared using Dragon voice recognition software and may include unintentional dictation errors.  Nanda Quinton, MD, [facility]    Jaasia Viglione, Wonda Olds, MD 08/07/22 [phone]

## 2022-08-02 NOTE — Discharge Instructions (Signed)
You were seen in the emergency room today with chest discomfort.  Your workup here is reassuring.  I am sending you home with anti-inflammatory medication.  Please take as prescribed and follow with your primary care physician in the coming week.  Return with any new or suddenly worsening symptoms.

## 2022-08-03 ENCOUNTER — Ambulatory Visit: Payer: Medicaid Other | Admitting: Family Medicine

## 2023-05-21 ENCOUNTER — Emergency Department (HOSPITAL_BASED_OUTPATIENT_CLINIC_OR_DEPARTMENT_OTHER)
Admission: EM | Admit: 2023-05-21 | Discharge: 2023-05-22 | Disposition: A | Discharge status: Home / Self Care | Payer: BLUE CROSS/BLUE SHIELD | Attending: Emergency Medicine | Admitting: Emergency Medicine

## 2023-05-21 ENCOUNTER — Encounter (HOSPITAL_BASED_OUTPATIENT_CLINIC_OR_DEPARTMENT_OTHER): Payer: Self-pay

## 2023-05-21 ENCOUNTER — Other Ambulatory Visit: Payer: Self-pay

## 2023-05-21 ENCOUNTER — Emergency Department (HOSPITAL_BASED_OUTPATIENT_CLINIC_OR_DEPARTMENT_OTHER): Payer: BLUE CROSS/BLUE SHIELD

## 2023-05-21 DIAGNOSIS — S8992XA Unspecified injury of left lower leg, initial encounter: Secondary | ICD-10-CM | POA: Diagnosis present

## 2023-05-21 DIAGNOSIS — Y9301 Activity, walking, marching and hiking: Secondary | ICD-10-CM | POA: Insufficient documentation

## 2023-05-21 DIAGNOSIS — Y9239 Other specified sports and athletic area as the place of occurrence of the external cause: Secondary | ICD-10-CM | POA: Insufficient documentation

## 2023-05-21 DIAGNOSIS — W010XXA Fall on same level from slipping, tripping and stumbling without subsequent striking against object, initial encounter: Secondary | ICD-10-CM | POA: Diagnosis not present

## 2023-05-21 NOTE — ED Triage Notes (Signed)
Pt arrives with c/o left knee injury that happened today. Pt tripped at gym. Pt has had knee surgery on left knee before. Pt reports swelling.

## 2023-05-22 ENCOUNTER — Encounter: Payer: Self-pay | Admitting: Orthopedic Surgery

## 2023-05-22 ENCOUNTER — Ambulatory Visit (INDEPENDENT_AMBULATORY_CARE_PROVIDER_SITE_OTHER): Payer: BLUE CROSS/BLUE SHIELD | Admitting: Surgical

## 2023-05-22 ENCOUNTER — Ambulatory Visit
Admission: RE | Admit: 2023-05-22 | Discharge: 2023-05-22 | Disposition: A | Discharge status: Home / Self Care | Payer: Medicaid Other | Source: Ambulatory Visit | Attending: Surgical | Admitting: Surgical

## 2023-05-22 ENCOUNTER — Encounter: Payer: Self-pay | Admitting: Surgical

## 2023-05-22 DIAGNOSIS — M25462 Effusion, left knee: Secondary | ICD-10-CM

## 2023-05-22 DIAGNOSIS — M25562 Pain in left knee: Secondary | ICD-10-CM

## 2023-05-22 DIAGNOSIS — S8992XA Unspecified injury of left lower leg, initial encounter: Secondary | ICD-10-CM | POA: Diagnosis not present

## 2023-05-22 MED ORDER — OXYCODONE-ACETAMINOPHEN 5-325 MG PO TABS
1.0000 | ORAL_TABLET | Freq: Once | ORAL | Status: AC
  Administered 2023-05-22: 1 via ORAL
  Filled 2023-05-22: qty 1

## 2023-05-22 MED ORDER — OXYCODONE-ACETAMINOPHEN 5-325 MG PO TABS: 1.0000 | ORAL_TABLET | Freq: Three times a day (TID) | ORAL | 0 refills | Status: DC | PRN

## 2023-05-22 NOTE — ED Provider Notes (Signed)
[facility] Provider Note   CSN: [id number] Arrival date & time: 05/21/23  2324     History  Chief Complaint  Patient presents with   Knee Injury    Karl Flores is a 27 y.o. male.  Patient here with left knee pain and " I tore my ACL again".  States he tore his ACL in 2018 and this feels similar.  He was walking at the gym when he tripped and fell twisting his left knee awkwardly.  Did not land directly on the knee.  Having pain to the left anterior medial knee worse with movement.  Difficulty bearing weight.  Denies hitting his head or losing consciousness.  No weakness, numbness or tingling.  No other injuries.  No head, neck, back, chest or abdominal pain.  States he had knee surgery in 2018 chart review shows it was by Dr. August Saucer.  The history is provided by the patient and a relative.       Home Medications Prior to Admission medications   Medication Sig Start Date End Date Taking? Authorizing Provider  ibuprofen (ADVIL) 800 MG tablet Take 1 tablet (800 mg total) by mouth every 8 (eight) hours as needed for moderate pain. 08/02/22   Long, Arlyss Repress, MD  magnesium 30 MG tablet Take 30 mg by mouth daily. Patient not taking: Reported on 04/09/2022    [provider]      Allergies    Penicillins    Review of Systems   Review of Systems  Constitutional:  Negative for activity change, appetite change and fever.  HENT:  Negative for congestion and rhinorrhea.   Respiratory:  Negative for cough, chest tightness and shortness of breath.   Gastrointestinal:  Negative for abdominal pain, nausea and vomiting.  Genitourinary:  Negative for dysuria and hematuria.  Musculoskeletal:  Positive for arthralgias and myalgias.  Skin:  Negative for rash.  Neurological:  Negative for dizziness, weakness and headaches.   all other systems are negative except as noted in the HPI and PMH.    Physical Exam Updated Vital Signs BP  113/62 (BP Location: Left Arm)   Pulse 65   Temp 98.2 F (36.8 C)   Resp 18   Wt 103 kg   SpO2 100%   BMI 30.79 kg/m  Physical Exam Vitals and nursing note reviewed.  Constitutional:      General: He is not in acute distress.    Appearance: He is well-developed.  HENT:     Head: Normocephalic and atraumatic.     Mouth/Throat:     Pharynx: No oropharyngeal exudate.  Eyes:     Conjunctiva/sclera: Conjunctivae normal.     Pupils: Pupils are equal, round, and reactive to light.  Neck:     Comments: No meningismus. Cardiovascular:     Rate and Rhythm: Normal rate and regular rhythm.     Heart sounds: Normal heart sounds. No murmur heard. Pulmonary:     Effort: Pulmonary effort is normal. No respiratory distress.     Breath sounds: Normal breath sounds.  Abdominal:     Palpations: Abdomen is soft.     Tenderness: There is no abdominal tenderness. There is no guarding or rebound.  Musculoskeletal:        General: Swelling, tenderness and signs of injury present.     Cervical back: Normal range of motion and neck supple.     Comments: Left anterior knee swelling and tenderness.  Tenderness along medial  joint line and pain with range of motion but able to flex and extend.  Able to lift leg and keep knee extended.  Intact DP and PT pulse. Compartments are soft. Positive anterior drawer test.  Skin:    General: Skin is warm.  Neurological:     Mental Status: He is alert and oriented to person, place, and time.     Cranial Nerves: No cranial nerve deficit.     Motor: No abnormal muscle tone.     Coordination: Coordination normal.     Comments:  5/5 strength throughout. CN 2-12 intact.Equal grip strength.   Psychiatric:        Behavior: Behavior normal.     ED Results / Procedures / Treatments   Labs (all labs ordered are listed, but only abnormal results are displayed) Labs Reviewed - No data to display  EKG None  Radiology DG Knee Complete 4 Views Left  Result Date:  05/22/2023 CLINICAL DATA:  Twisting knee injury after trip and fall. Previous surgery in 2018 EXAM: LEFT KNEE - COMPLETE 4+ VIEW COMPARISON:  11/04/2016 FINDINGS: Postoperative changes consistent with anterior cruciate repair. No evidence of acute fracture or dislocation. No focal bone lesion or bone destruction. Joint space is normal. No significant effusion. IMPRESSION: No acute bony abnormalities. Electronically Signed   By: Burman Nieves M.D.   On: 05/22/2023 00:26    Procedures Procedures    Medications Ordered in ED Medications  oxyCODONE-acetaminophen (PERCOCET/ROXICET) 5-325 MG per tablet 1 tablet (1 tablet Oral Given 05/22/23 0040)    ED Course/ Medical Decision Making/ A&P                                 Medical Decision Making Amount and/or Complexity of Data Reviewed Labs: ordered. Decision-making details documented in ED Course. Radiology: ordered and independent interpretation performed. Decision-making details documented in ED Course. ECG/medicine tests: ordered and independent interpretation performed. Decision-making details documented in ED Course.  Risk Prescription drug management.   Fall with knee injury.  No direct trauma.  Vital stable.  No distress.  Did not hit head.  No other injury.  Neurovascularly intact without deformity.  Xray is negative for fracture or dislocation.  Results reviewed and interpreted by me.  Concern for possible meniscal or ligament tear again.  Will place in knee immobilizer, crutches, ice, NWB, elevation, pain control and orthopedic follow-up for MRI.  Return precautions discussed including worsening pain, weakness, numbness, tingling, fever or any other concerns.       Final Clinical Impression(s) / ED Diagnoses Final diagnoses:  Injury of left knee, initial encounter    Rx / DC Orders ED Discharge Orders     None         Malori Myers, Jeannett Senior, MD 05/22/23 [phone]

## 2023-05-22 NOTE — ED Notes (Signed)
Pt reports about 45 minutes ago he was at the gym and fell on his knee.  He believes he has re-torn his ACL, "feels the same."  Pt is alert and oriented, no other complaints at this time.

## 2023-05-22 NOTE — ED Notes (Signed)
RN placed knee immobilizer and adjusted to pt. Pt has experience w/ crutches in the past and was able to demonstrate safe use of them.  Discussed follow up and pain control at home.  Prescription reviewed including safety.  No further questions at this time.

## 2023-05-22 NOTE — Progress Notes (Signed)
Hi Karl Flores can Karl Flores see Dr August Saucer today or friday

## 2023-05-22 NOTE — Discharge Instructions (Signed)
There is some concern you damaged your ACL or meniscus again.  Wear the immobilizer and do not put weight on your leg.  Use ice, elevation, pain control.  Follow-up with Dr. August Saucer for recheck of your knee likely including MRI.  Return to the ED with worsening pain, weakness, numbness, tingling or other concerns.

## 2023-05-22 NOTE — Progress Notes (Signed)
Office Visit Note   Patient: Karl Flores           Date of Birth: [birth year]           MRN: [id number] Visit Date: 05/22/2023 Requested by: Loyola Mast, MD [address] [place],  [place] [address] PCP: Loyola Mast, MD  Subjective: Chief Complaint  Patient presents with   Left Knee - Pain    HPI: Karl Flores is a 27 y.o. male who presents to the office reporting left knee pain.  Patient states that on 05/21/2023 he was working out at his gym and he tripped over a dumbbell.  He landed awkwardly and felt a pop in his knee.  Since then he has had significant instability and pain and has not been able to bear weight on the left lower extremity.  He denies any pain in his hip or any other joints aside from the knee.  He notes instant swelling at the time of injury.  He has history of prior ACL reconstruction with Dr. August Saucer back in 2018 with hamstring autograft along with partial lateral meniscectomy and posterolateral corner reconstruction.  Prior to injury he was not playing any sports but he does do weightlifting about 3 times a week.  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  Has a upcoming trip in January to [place] to pick up his wife.  Currently ambulating with crutches and knee immobilizer..                ROS: All systems reviewed are negative as they relate to the chief complaint within the history of present illness.  Patient denies fevers or chills.  Assessment & Plan: Visit Diagnoses:  1. Effusion of left knee   2. Left knee pain, unspecified chronicity     Plan: Patient is a 27 year old male who presents s/p left knee injury on 05/21/2023.  Felt a pop as he fell with fairly immediate swelling that he noticed.  Has history of prior ACL reconstruction with 8 mm hamstring autograft along with posterior lateral corner reconstruction and partial lateral meniscectomy in 2018 by Dr. August Saucer.  He was doing very well following his knee surgery up until this injury.  With mechanism of injury  and ACL laxity as noted on exam today, need MRI of the left knee to evaluate ACL graft tear.  Plan to order this as soon as possible and have him follow-up with Dr. August Saucer to discuss likely surgical intervention.  Did offer aspiration today to help with the patient's symptoms but he wants to avoid this today due to discomfort with needles.  Follow-Up Instructions: Return for Review CT/MRI scan.   Orders:  Orders Placed This Encounter  Procedures   MR Knee Left w/o contrast   No orders of the defined types were placed in this encounter.     Procedures: No procedures performed   Clinical Data: No additional findings.  Objective: Vital Signs: There were no vitals taken for this visit.  Physical Exam:  Constitutional: Patient appears well-developed HEENT:  Head: Normocephalic Eyes:EOM are normal Neck: Normal range of motion Cardiovascular: Normal rate Pulmonary/chest: Effort normal Neurologic: Patient is alert Skin: Skin is warm Psychiatric: Patient has normal mood and affect  Ortho Exam: Ortho exam demonstrates left knee with large effusion.  There is significant pain with passive motion of the knee.  Incisions from prior ACL reconstruction are well-healed.  No calf tenderness.  Negative Homans' sign.  Palpable DP pulse.  Intact ankle dorsiflexion  and plantarflexion.  Intact quad strength and patient is able to perform straight leg raise.  Posterolateral rotational stability seems intact.  Patient not able to tolerate flexion and 90 degrees so anterior posterior drawer not able to be performed.  Lachman exam demonstrates ACL graft laxity.  Specialty Comments:  No specialty comments available.  Imaging: DG Knee Complete 4 Views Left  Result Date: 05/22/2023 CLINICAL DATA:  Twisting knee injury after trip and fall. Previous surgery in 2018 EXAM: LEFT KNEE - COMPLETE 4+ VIEW COMPARISON:  11/04/2016 FINDINGS: Postoperative changes consistent with anterior cruciate repair. No  evidence of acute fracture or dislocation. No focal bone lesion or bone destruction. Joint space is normal. No significant effusion. IMPRESSION: No acute bony abnormalities. Electronically Signed   By: Burman Nieves M.D.   On: 05/22/2023 00:26     PMFS History: There are no problems to display for this patient.  History reviewed. No pertinent past medical history.  Family History  Problem Relation Age of Onset   Diabetes Father    Hyperlipidemia Father    Hypertension Father    Heart disease Father    Ulcerative colitis Brother    Stroke Paternal Grandmother    Diabetes Paternal Grandfather    Heart disease Paternal Grandfather    Colon cancer Neg Hx    Esophageal cancer Neg Hx    Stomach cancer Neg Hx    Rectal cancer Neg Hx     Past Surgical History:  Procedure Laterality Date   BRAIN SURGERY     COLONOSCOPY     KNEE ARTHROSCOPY WITH ANTERIOR CRUCIATE LIGAMENT (ACL) REPAIR WITH HAMSTRING GRAFT Left 12/04/2016   Procedure: KNEE ARTHROSCOPY WITH ANTERIOR CRUCIATE LIGAMENT (ACL) REPAIR WITH HAMSTRING GRAFT WITH POSSIBLE POSTROLATERAL CORNER RECONSTRUCTION AND LATERAL MENISCAL REPAIR VERSUS RESECTION;  Surgeon: Cammy Copa, MD;  Location: [facility] OR;  Service: Orthopedics;  Laterality: Left;   KNEE ARTHROSCOPY WITH MENISCAL REPAIR Left 12/04/2016   Procedure: KNEE ARTHROSCOPY WITH MENISCAL REPAIR;  Surgeon: Cammy Copa, MD;  Location: [facility] OR;  Service: Orthopedics;  Laterality: Left;   WISDOM TOOTH EXTRACTION     Social History   Occupational History   Occupation: Research officer, political party    Comment: [place]  Tobacco Use   Smoking status: Never   Smokeless tobacco: Never   Tobacco comments:    hookah once a week  Vaping Use   Vaping status: Never Used  Substance and Sexual Activity   Alcohol use: Never   Drug use: No   Sexual activity: Never    Birth control/protection: None

## 2023-05-23 ENCOUNTER — Ambulatory Visit (INDEPENDENT_AMBULATORY_CARE_PROVIDER_SITE_OTHER): Payer: BLUE CROSS/BLUE SHIELD | Admitting: Orthopedic Surgery

## 2023-05-23 DIAGNOSIS — S83512D Sprain of anterior cruciate ligament of left knee, subsequent encounter: Secondary | ICD-10-CM | POA: Diagnosis not present

## 2023-05-24 ENCOUNTER — Encounter: Payer: Self-pay | Admitting: Orthopedic Surgery

## 2023-05-24 MED ORDER — LIDOCAINE HCL 1 % IJ SOLN
5.0000 mL | INTRAMUSCULAR | Status: AC | PRN
  Administered 2023-05-23: 5 mL

## 2023-05-24 NOTE — Progress Notes (Signed)
Office Visit Note   Patient: Karl Flores           Date of Birth: 1995/08/31           MRN: [id number] Visit Date: 05/23/2023 Requested by: Loyola Mast, MD [address] [place],  [place] [address] PCP: Loyola Mast, MD  Subjective: Chief Complaint  Patient presents with   Other     Review MRI    HPI: Karl Flores is a 27 y.o. male who presents to the office reporting left knee pain.  Since he was last seen has had an MRI scan.  He had hamstring autograft ACL reconstruction along with posterolateral corner reconstruction 7 years ago.  Was doing very well until he sustained an injury in the weight room.  Had an impact twisting type injury when he tripped over a dumbbell on the floor.  MRI scan is reviewed and it does show tear of the ACL graft along with meniscocapsular junction of the medial meniscus.  Lateral meniscus appears intact.  Collaterals appear to have some edema particularly the MCL but no discrete tearing.  There is also some edema on the lateral side but the lateral collateral ligament is structurally intact on the scan.  Patient would like to travel to [place] in January.  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  He typically just works out for exercise and does not do too much in terms of cutting and pivoting exercises..                ROS: All systems reviewed are negative as they relate to the chief complaint within the history of present illness.  Patient denies fevers or chills.  Assessment & Plan: Visit Diagnoses:  1. Rupture of anterior cruciate ligament of left knee, subsequent encounter     Plan: Impression is left knee ACL tear with instability and medial meniscal pathology.  Collateral ligaments feel stable and there is no posterolateral rotatory instability on somewhat guarded exam today.  He does have a large effusion which is aspirated and we did get out over 100 cc of blood.  Plan for ACL reconstruction with bone patellar tendon bone autograft  and medial meniscal repair.  The risk and benefits are discussed with the patient including not limited to infection knee stiffness nerve and vessel damage along with incomplete restoration of function and incomplete pain relief.  Extensive nature of the rehabilitative process is discussed including a period of nonweightbearing for the medial meniscal repair.  Based on the location of the tear this may be an inside-out repair versus all inside repair.  Patient understands the risks and benefits and wishes to get this done as soon as possible.  I would like to give him at least 2 weeks to get full flexion prior to reconstruction.  He currently does have full extension.  Aspirating the knee today will help him to achieve the full flexion.  All questions answered.  Follow-Up Instructions: No follow-ups on file.   Orders:  No orders of the defined types were placed in this encounter.  No orders of the defined types were placed in this encounter.     Procedures: Large Joint Inj: L knee on 05/23/2023 6:10 AM Indications: diagnostic evaluation, joint swelling and pain Details: 18 G 1.5 in needle, superolateral approach  Arthrogram: No  Medications: 5 mL lidocaine 1 % Aspirate: 100 mL bloody Outcome: tolerated well, no immediate complications Procedure, treatment alternatives, risks and benefits explained, specific risks  discussed. Consent was given by the patient. Immediately prior to procedure a time out was called to verify the correct patient, procedure, equipment, support staff and site/side marked as required. Patient was prepped and draped in the usual sterile fashion.       Clinical Data: No additional findings.  Objective: Vital Signs: There were no vitals taken for this visit.  Physical Exam:  Constitutional: Patient appears well-developed HEENT:  Head: Normocephalic Eyes:EOM are normal Neck: Normal range of motion Cardiovascular: Normal rate Pulmonary/chest: Effort  normal Neurologic: Patient is alert Skin: Skin is warm Psychiatric: Patient has normal mood and affect  Ortho Exam: Ortho exam demonstrates palpable pedal pulses.  Left knee has full extension but also large effusion in the knee.  No patellar instability is present.  Quad tendon and patellar tendon are functional but he does have some atrophy in the leg already.  No calf tenderness negative Homans today.  Collaterals feel stable to varus valgus stress at 0 and 30 degrees.  No posterolateral rotatory instability.  ACL laxity is present with positive Lachman positive anterior drawer with soft endpoint after 4 5 mm of anterior translation.  PCL intact.  Specialty Comments:  No specialty comments available.  Imaging: No results found.   PMFS History: There are no problems to display for this patient.  History reviewed. No pertinent past medical history.  Family History  Problem Relation Age of Onset   Diabetes Father    Hyperlipidemia Father    Hypertension Father    Heart disease Father    Ulcerative colitis Brother    Stroke Paternal Grandmother    Diabetes Paternal Grandfather    Heart disease Paternal Grandfather    Colon cancer Neg Hx    Esophageal cancer Neg Hx    Stomach cancer Neg Hx    Rectal cancer Neg Hx     Past Surgical History:  Procedure Laterality Date   BRAIN SURGERY     COLONOSCOPY     KNEE ARTHROSCOPY WITH ANTERIOR CRUCIATE LIGAMENT (ACL) REPAIR WITH HAMSTRING GRAFT Left 12/04/2016   Procedure: KNEE ARTHROSCOPY WITH ANTERIOR CRUCIATE LIGAMENT (ACL) REPAIR WITH HAMSTRING GRAFT WITH POSSIBLE POSTROLATERAL CORNER RECONSTRUCTION AND LATERAL MENISCAL REPAIR VERSUS RESECTION;  Surgeon: Cammy Copa, MD;  Location: [facility] OR;  Service: Orthopedics;  Laterality: Left;   KNEE ARTHROSCOPY WITH MENISCAL REPAIR Left 12/04/2016   Procedure: KNEE ARTHROSCOPY WITH MENISCAL REPAIR;  Surgeon: Cammy Copa, MD;  Location: [facility] OR;  Service: Orthopedics;  Laterality: Left;    WISDOM TOOTH EXTRACTION     Social History   Occupational History   Occupation: Research officer, political party    Comment: [place]  Tobacco Use   Smoking status: Never   Smokeless tobacco: Never   Tobacco comments:    hookah once a week  Vaping Use   Vaping status: Never Used  Substance and Sexual Activity   Alcohol use: Never   Drug use: No   Sexual activity: Never    Birth control/protection: None

## 2023-07-02 ENCOUNTER — Other Ambulatory Visit: Payer: Self-pay

## 2023-07-02 ENCOUNTER — Encounter (HOSPITAL_COMMUNITY): Payer: Self-pay | Admitting: Orthopedic Surgery

## 2023-07-02 NOTE — Progress Notes (Signed)
 SDW CALL  Patient was given pre-op instructions over the phone. The opportunity was given for the patient to ask questions. No further questions asked. Patient verbalized understanding of instructions given.   PCP - Dr. Garnette Simpler Cardiologist - denies  PPM/ICD - denies Device Orders - n/a Rep Notified - n/a  Chest x-ray - 08/02/22 EKG - 08/02/22 Stress Test - denies ECHO - denies Cardiac Cath - denies  Sleep Study - denies CPAP - n/a  No DM  Last dose of GLP1 agonist-  n/a GLP1 instructions: n/a  Blood Thinner Instructions: n/a Aspirin  Instructions: n/a  ERAS Protcol -clears until 0800   COVID TEST- n/a   Anesthesia review: no  Patient denies shortness of breath, fever, cough and chest pain over the phone call   All instructions explained to the patient, with a verbal understanding of the material. Patient agrees to go over the instructions while at home for a better understanding.

## 2023-07-02 NOTE — Progress Notes (Signed)
 Surgical Instructions   Your procedure is scheduled on Thursday, January 16th, 2025. Report to [facility] Main Entrance A at 8:30 A.M., then check in with the Admitting office. Any questions or running late day of surgery: call [phone]  Questions prior to your surgery date: call [phone], Monday-Friday, 8am-4pm. If you experience any cold or flu symptoms such as cough, fever, chills, shortness of breath, etc. between now and your scheduled surgery, please notify us  at the above number.     Remember:  Do not eat after midnight the night before your surgery   You may drink clear liquids until 8:00 the morning of your surgery.   Clear liquids allowed are: Water, Non-Citrus Juices (without pulp), Carbonated Beverages, Clear Tea (no milk, honey, etc.), Black Coffee Only (NO MILK, CREAM OR POWDERED CREAMER of any kind), and Gatorade.    Take these medicines the morning of surgery with A SIP OF WATER: None.    May take these medicines IF NEEDED: Percocet.     One week prior to surgery, STOP taking any Aspirin  (unless otherwise instructed by your surgeon) Aleve , Naproxen , Ibuprofen , Motrin , Advil , Goody's, BC's, all herbal medications, fish oil, and non-prescription vitamins.                     Do NOT Smoke (Tobacco/Vaping) for 24 hours prior to your procedure.    You will be asked to remove any contacts, glasses, piercing's, hearing aid's, dentures/partials prior to surgery. Please bring cases for these items if needed.    Patients discharged the day of surgery will not be allowed to drive home, and someone needs to stay with them for 24 hours.  SURGICAL WAITING ROOM VISITATION Patients may have no more than 2 support people in the waiting area - these visitors may rotate.   Pre-op nurse will coordinate an appropriate time for 1 ADULT support person, who may not rotate, to accompany patient in pre-op.  Children under the age of 73 must have an adult with them who is not  the patient and must remain in the main waiting area with an adult.  If the patient needs to stay at the hospital during part of their recovery, the visitor guidelines for inpatient rooms apply.  Please refer to the [facility] website for the visitor guidelines for any additional information.   If you received a COVID test during your pre-op visit  it is requested that you wear a mask when out in public, stay away from anyone that may not be feeling well and notify your surgeon if you develop symptoms. If you have been in contact with anyone that has tested positive in the last 10 days please notify you surgeon.      Additional instructions for the day of surgery: DO NOT APPLY any lotions, deodorants, cologne, or perfumes.   Do not wear jewelry or makeup Do not wear nail polish, gel polish, artificial nails, or any other type of covering on natural nails (fingers and toes) Do not bring valuables to the hospital. [facility] is not responsible for valuables/personal belongings. Put on clean/comfortable clothes.  Please brush your teeth.  Ask your nurse before applying any prescription medications to the skin.

## 2023-07-04 ENCOUNTER — Ambulatory Visit (HOSPITAL_COMMUNITY): Payer: BLUE CROSS/BLUE SHIELD | Admitting: Anesthesiology

## 2023-07-04 ENCOUNTER — Encounter (HOSPITAL_COMMUNITY): Payer: Self-pay | Admitting: Orthopedic Surgery

## 2023-07-04 ENCOUNTER — Other Ambulatory Visit: Payer: Self-pay

## 2023-07-04 ENCOUNTER — Encounter: Payer: Self-pay | Admitting: Orthopedic Surgery

## 2023-07-04 ENCOUNTER — Encounter (HOSPITAL_COMMUNITY)
Admission: RE | Disposition: A | Discharge status: Home / Self Care | Payer: Self-pay | Source: Home / Self Care | Attending: Orthopedic Surgery

## 2023-07-04 ENCOUNTER — Ambulatory Visit (HOSPITAL_COMMUNITY)
Admission: RE | Admit: 2023-07-04 | Discharge: 2023-07-04 | Disposition: A | Discharge status: Home / Self Care | Payer: BLUE CROSS/BLUE SHIELD | Attending: Orthopedic Surgery | Admitting: Orthopedic Surgery

## 2023-07-04 DIAGNOSIS — Y9239 Other specified sports and athletic area as the place of occurrence of the external cause: Secondary | ICD-10-CM | POA: Insufficient documentation

## 2023-07-04 DIAGNOSIS — F17210 Nicotine dependence, cigarettes, uncomplicated: Secondary | ICD-10-CM | POA: Insufficient documentation

## 2023-07-04 DIAGNOSIS — S83511A Sprain of anterior cruciate ligament of right knee, initial encounter: Secondary | ICD-10-CM

## 2023-07-04 DIAGNOSIS — S83242A Other tear of medial meniscus, current injury, left knee, initial encounter: Secondary | ICD-10-CM | POA: Insufficient documentation

## 2023-07-04 DIAGNOSIS — S83512A Sprain of anterior cruciate ligament of left knee, initial encounter: Secondary | ICD-10-CM | POA: Insufficient documentation

## 2023-07-04 DIAGNOSIS — X501XXA Overexertion from prolonged static or awkward postures, initial encounter: Secondary | ICD-10-CM | POA: Diagnosis not present

## 2023-07-04 DIAGNOSIS — S83282A Other tear of lateral meniscus, current injury, left knee, initial encounter: Secondary | ICD-10-CM | POA: Diagnosis not present

## 2023-07-04 DIAGNOSIS — Z01818 Encounter for other preprocedural examination: Secondary | ICD-10-CM

## 2023-07-04 HISTORY — PX: ANTERIOR CRUCIATE LIGAMENT REPAIR: SHX115

## 2023-07-04 HISTORY — PX: HARDWARE REMOVAL: SHX979

## 2023-07-04 SURGERY — RECONSTRUCTION, KNEE, ACL, USING HAMSTRING GRAFT
Anesthesia: Regional | Laterality: Left

## 2023-07-04 MED ORDER — AMISULPRIDE (ANTIEMETIC) 5 MG/2ML IV SOLN
INTRAVENOUS | Status: AC
  Filled 2023-07-04: qty 4

## 2023-07-04 MED ORDER — POVIDONE-IODINE 10 % EX SWAB: 2.0000 | Freq: Once | CUTANEOUS | Status: AC

## 2023-07-04 MED ORDER — DEXMEDETOMIDINE HCL IN NACL 80 MCG/20ML IV SOLN
INTRAVENOUS | Status: DC | PRN
  Administered 2023-07-04: 4 ug via INTRAVENOUS
  Administered 2023-07-04: 8 ug via INTRAVENOUS

## 2023-07-04 MED ORDER — FENTANYL CITRATE (PF) 100 MCG/2ML IJ SOLN
INTRAMUSCULAR | Status: AC
  Administered 2023-07-04: 100 ug via INTRAVENOUS
  Filled 2023-07-04: qty 2

## 2023-07-04 MED ORDER — CHLORHEXIDINE GLUCONATE 0.12 % MT SOLN
15.0000 mL | Freq: Once | OROMUCOSAL | Status: AC
  Administered 2023-07-04: 15 mL via OROMUCOSAL
  Filled 2023-07-04: qty 15

## 2023-07-04 MED ORDER — PROPOFOL 10 MG/ML IV BOLUS
INTRAVENOUS | Status: AC
  Filled 2023-07-04: qty 20

## 2023-07-04 MED ORDER — DEXAMETHASONE SODIUM PHOSPHATE 10 MG/ML IJ SOLN
INTRAMUSCULAR | Status: AC
  Filled 2023-07-04: qty 1

## 2023-07-04 MED ORDER — METHOCARBAMOL 500 MG PO TABS: 500.0000 mg | ORAL_TABLET | Freq: Three times a day (TID) | ORAL | 1 refills | Status: DC | PRN

## 2023-07-04 MED ORDER — AMISULPRIDE (ANTIEMETIC) 5 MG/2ML IV SOLN
10.0000 mg | Freq: Once | INTRAVENOUS | Status: AC | PRN
  Administered 2023-07-04: 10 mg via INTRAVENOUS

## 2023-07-04 MED ORDER — FENTANYL CITRATE (PF) 250 MCG/5ML IJ SOLN
INTRAMUSCULAR | Status: DC | PRN
  Administered 2023-07-04 (×2): 50 ug via INTRAVENOUS
  Administered 2023-07-04: 100 ug via INTRAVENOUS
  Administered 2023-07-04: 50 ug via INTRAVENOUS

## 2023-07-04 MED ORDER — CLONIDINE HCL (ANALGESIA) 100 MCG/ML EP SOLN
EPIDURAL | Status: AC
  Filled 2023-07-04: qty 10

## 2023-07-04 MED ORDER — LIDOCAINE 2% (20 MG/ML) 5 ML SYRINGE
INTRAMUSCULAR | Status: DC | PRN
  Administered 2023-07-04: 40 mg via INTRAVENOUS

## 2023-07-04 MED ORDER — POVIDONE-IODINE 10 % EX SWAB
2.0000 | Freq: Once | CUTANEOUS | Status: AC
  Administered 2023-07-04: 2 via TOPICAL

## 2023-07-04 MED ORDER — ONDANSETRON HCL 4 MG/2ML IJ SOLN: 4.0000 mg | Freq: Once | INTRAMUSCULAR | Status: DC | PRN

## 2023-07-04 MED ORDER — ORAL CARE MOUTH RINSE: 15.0000 mL | Freq: Once | OROMUCOSAL | Status: AC

## 2023-07-04 MED ORDER — ASPIRIN 81 MG PO CHEW: 81.0000 mg | CHEWABLE_TABLET | Freq: Two times a day (BID) | ORAL | 0 refills | Status: AC

## 2023-07-04 MED ORDER — FENTANYL CITRATE (PF) 100 MCG/2ML IJ SOLN: 100.0000 ug | Freq: Once | INTRAMUSCULAR | Status: AC

## 2023-07-04 MED ORDER — SODIUM CHLORIDE 0.9 % IR SOLN
Status: DC | PRN
  Administered 2023-07-04 (×4): 3001 mL

## 2023-07-04 MED ORDER — MIDAZOLAM HCL 2 MG/2ML IJ SOLN: 2.0000 mg | Freq: Once | INTRAMUSCULAR | Status: AC

## 2023-07-04 MED ORDER — KETAMINE HCL 50 MG/5ML IJ SOSY
PREFILLED_SYRINGE | INTRAMUSCULAR | Status: AC
  Filled 2023-07-04: qty 5

## 2023-07-04 MED ORDER — ROCURONIUM BROMIDE 10 MG/ML (PF) SYRINGE
PREFILLED_SYRINGE | INTRAVENOUS | Status: DC | PRN
  Administered 2023-07-04: 100 mg via INTRAVENOUS

## 2023-07-04 MED ORDER — MORPHINE SULFATE (PF) 4 MG/ML IV SOLN
INTRAVENOUS | Status: AC
  Filled 2023-07-04: qty 1

## 2023-07-04 MED ORDER — FENTANYL CITRATE (PF) 250 MCG/5ML IJ SOLN
INTRAMUSCULAR | Status: AC
  Filled 2023-07-04: qty 5

## 2023-07-04 MED ORDER — ONDANSETRON HCL 4 MG/2ML IJ SOLN
INTRAMUSCULAR | Status: DC | PRN
  Administered 2023-07-04: 4 mg via INTRAVENOUS

## 2023-07-04 MED ORDER — VANCOMYCIN HCL 1000 MG IV SOLR
INTRAVENOUS | Status: AC
  Filled 2023-07-04: qty 20

## 2023-07-04 MED ORDER — TRANEXAMIC ACID-NACL 1000-0.7 MG/100ML-% IV SOLN
1000.0000 mg | INTRAVENOUS | Status: AC
  Administered 2023-07-04: 1000 mg via INTRAVENOUS
  Filled 2023-07-04: qty 100

## 2023-07-04 MED ORDER — ONDANSETRON HCL 4 MG/2ML IJ SOLN
INTRAMUSCULAR | Status: AC
  Filled 2023-07-04: qty 2

## 2023-07-04 MED ORDER — OXYCODONE HCL 5 MG PO TABS: 5.0000 mg | ORAL_TABLET | Freq: Once | ORAL | Status: DC | PRN

## 2023-07-04 MED ORDER — MIDAZOLAM HCL 2 MG/2ML IJ SOLN
INTRAMUSCULAR | Status: AC
  Filled 2023-07-04: qty 2

## 2023-07-04 MED ORDER — POVIDONE-IODINE 7.5 % EX SOLN
Freq: Once | CUTANEOUS | Status: AC
  Filled 2023-07-04: qty 118

## 2023-07-04 MED ORDER — MEPERIDINE HCL 25 MG/ML IJ SOLN: 6.2500 mg | INTRAMUSCULAR | Status: DC | PRN

## 2023-07-04 MED ORDER — DEXMEDETOMIDINE HCL IN NACL 80 MCG/20ML IV SOLN
INTRAVENOUS | Status: AC
  Filled 2023-07-04: qty 20

## 2023-07-04 MED ORDER — MIDAZOLAM HCL 2 MG/2ML IJ SOLN
INTRAMUSCULAR | Status: DC | PRN
  Administered 2023-07-04: 2 mg via INTRAVENOUS

## 2023-07-04 MED ORDER — KETOROLAC TROMETHAMINE 30 MG/ML IJ SOLN
30.0000 mg | Freq: Once | INTRAMUSCULAR | Status: DC | PRN
Start: 2023-07-04 — End: 2023-07-04

## 2023-07-04 MED ORDER — MIDAZOLAM HCL 2 MG/2ML IJ SOLN
INTRAMUSCULAR | Status: AC
  Administered 2023-07-04: 2 mg via INTRAVENOUS
  Filled 2023-07-04: qty 2

## 2023-07-04 MED ORDER — EPINEPHRINE PF 1 MG/ML IJ SOLN
INTRAMUSCULAR | Status: AC
  Filled 2023-07-04: qty 3

## 2023-07-04 MED ORDER — SUGAMMADEX SODIUM 200 MG/2ML IV SOLN
INTRAVENOUS | Status: DC | PRN
  Administered 2023-07-04: 200 mg via INTRAVENOUS

## 2023-07-04 MED ORDER — KETAMINE HCL 10 MG/ML IJ SOLN
INTRAMUSCULAR | Status: DC | PRN
  Administered 2023-07-04: 10 mg via INTRAVENOUS
  Administered 2023-07-04 (×2): 20 mg via INTRAVENOUS

## 2023-07-04 MED ORDER — PROPOFOL 10 MG/ML IV BOLUS
INTRAVENOUS | Status: DC | PRN
  Administered 2023-07-04: 20 mg via INTRAVENOUS
  Administered 2023-07-04: 200 mg via INTRAVENOUS

## 2023-07-04 MED ORDER — HYDROMORPHONE HCL 1 MG/ML IJ SOLN
INTRAMUSCULAR | Status: DC | PRN
  Administered 2023-07-04: .5 mg via INTRAVENOUS

## 2023-07-04 MED ORDER — ROCURONIUM BROMIDE 10 MG/ML (PF) SYRINGE
PREFILLED_SYRINGE | INTRAVENOUS | Status: AC
  Filled 2023-07-04: qty 10

## 2023-07-04 MED ORDER — BUPIVACAINE-EPINEPHRINE (PF) 0.25% -1:200000 IJ SOLN
INTRAMUSCULAR | Status: AC
  Filled 2023-07-04: qty 30

## 2023-07-04 MED ORDER — HYDROMORPHONE HCL 1 MG/ML IJ SOLN: 0.2500 mg | INTRAMUSCULAR | Status: DC | PRN

## 2023-07-04 MED ORDER — EPINEPHRINE PF 1 MG/ML IJ SOLN
INTRAMUSCULAR | Status: AC
  Filled 2023-07-04: qty 1

## 2023-07-04 MED ORDER — LIDOCAINE 2% (20 MG/ML) 5 ML SYRINGE
INTRAMUSCULAR | Status: AC
  Filled 2023-07-04: qty 5

## 2023-07-04 MED ORDER — OXYCODONE HCL 5 MG PO TABS: 5.0000 mg | ORAL_TABLET | ORAL | 0 refills | Status: DC | PRN

## 2023-07-04 MED ORDER — DEXAMETHASONE SODIUM PHOSPHATE 10 MG/ML IJ SOLN
INTRAMUSCULAR | Status: DC | PRN
  Administered 2023-07-04: 10 mg via INTRAVENOUS

## 2023-07-04 MED ORDER — 0.9 % SODIUM CHLORIDE (POUR BTL) OPTIME
TOPICAL | Status: DC | PRN
  Administered 2023-07-04: 1000 mL

## 2023-07-04 MED ORDER — VANCOMYCIN HCL 1000 MG IV SOLR
INTRAVENOUS | Status: DC | PRN
  Administered 2023-07-04: 1000 mg via TOPICAL

## 2023-07-04 MED ORDER — KETOROLAC TROMETHAMINE 30 MG/ML IJ SOLN
INTRAMUSCULAR | Status: AC
  Filled 2023-07-04: qty 1

## 2023-07-04 MED ORDER — CELECOXIB 100 MG PO CAPS: 100.0000 mg | ORAL_CAPSULE | Freq: Two times a day (BID) | ORAL | 0 refills | Status: DC

## 2023-07-04 MED ORDER — OXYCODONE HCL 5 MG/5ML PO SOLN: 5.0000 mg | Freq: Once | ORAL | Status: DC | PRN

## 2023-07-04 MED ORDER — MORPHINE SULFATE 4 MG/ML IJ SOLN
INTRAMUSCULAR | Status: DC | PRN
  Administered 2023-07-04: 15 mL via INTRAMUSCULAR

## 2023-07-04 MED ORDER — CEFAZOLIN SODIUM-DEXTROSE 2-4 GM/100ML-% IV SOLN
2.0000 g | INTRAVENOUS | Status: AC
  Administered 2023-07-04: 2 g via INTRAVENOUS
  Filled 2023-07-04: qty 100

## 2023-07-04 MED ORDER — SODIUM CHLORIDE 0.9 % IV SOLN: INTRAVENOUS | Status: DC | PRN

## 2023-07-04 MED ORDER — HYDROMORPHONE HCL 1 MG/ML IJ SOLN
INTRAMUSCULAR | Status: AC
  Filled 2023-07-04: qty 0.5

## 2023-07-04 MED ORDER — ACETAMINOPHEN 500 MG PO TABS
1000.0000 mg | ORAL_TABLET | Freq: Once | ORAL | Status: AC
  Administered 2023-07-04: 1000 mg via ORAL
  Filled 2023-07-04: qty 2

## 2023-07-04 MED ORDER — SODIUM CHLORIDE 0.9 % IR SOLN
Status: DC | PRN
  Administered 2023-07-04 (×3): 3000 mL

## 2023-07-04 MED ORDER — LACTATED RINGERS IV SOLN: INTRAVENOUS | Status: DC

## 2023-07-04 MED ORDER — KETOROLAC TROMETHAMINE 30 MG/ML IJ SOLN
INTRAMUSCULAR | Status: DC | PRN
  Administered 2023-07-04: 30 mg via INTRAVENOUS

## 2023-07-04 SURGICAL SUPPLY — 107 items
ALCOHOL 70% 16 OZ (MISCELLANEOUS) ×1 IMPLANT
ANCHOR PEEK 4.75X19.1 SWLK C (Anchor) IMPLANT
BAG COUNTER SPONGE SURGICOUNT (BAG) ×1 IMPLANT
BANDAGE ESMARK 6X9 LF (GAUZE/BANDAGES/DRESSINGS) IMPLANT
BLADE LONG MED 31X9 (MISCELLANEOUS) IMPLANT
BLADE SURG 10 STRL SS (BLADE) ×1 IMPLANT
BLADE SURG 15 STRL LF DISP TIS (BLADE) ×2 IMPLANT
BNDG COHESIVE 4X5 TAN STRL (GAUZE/BANDAGES/DRESSINGS) IMPLANT
BNDG ELASTIC 4X5.8 VLCR STR LF (GAUZE/BANDAGES/DRESSINGS) IMPLANT
BNDG ELASTIC 6X15 VLCR STRL LF (GAUZE/BANDAGES/DRESSINGS) ×1 IMPLANT
BNDG ELASTIC 6X5.8 VLCR STR LF (GAUZE/BANDAGES/DRESSINGS) IMPLANT
BNDG ESMARK 6X9 LF (GAUZE/BANDAGES/DRESSINGS) ×1
BNDG GAUZE DERMACEA FLUFF 4 (GAUZE/BANDAGES/DRESSINGS) ×1 IMPLANT
BURR OVAL 8 FLU 4.0X13 (MISCELLANEOUS) IMPLANT
COVER MAYO STAND STRL (DRAPES) ×1 IMPLANT
COVER SURGICAL LIGHT HANDLE (MISCELLANEOUS) ×1 IMPLANT
CUFF TOURN SGL QUICK 42 (TOURNIQUET CUFF) IMPLANT
CUFF TRNQT CYL 34X4.125X (TOURNIQUET CUFF) IMPLANT
CUTTER TENSIONER SUT 2-0 0 FBW (INSTRUMENTS) IMPLANT
DISSECTOR 4.0MM X 13CM (MISCELLANEOUS) ×1 IMPLANT
DRAPE ARTHROSCOPY W/POUCH 114 (DRAPES) ×1 IMPLANT
DRAPE C-ARM 42X72 X-RAY (DRAPES) IMPLANT
DRAPE HALF SHEET 40X57 (DRAPES) IMPLANT
DRAPE INCISE IOBAN 66X45 STRL (DRAPES) ×1 IMPLANT
DRAPE OEC MINIVIEW 54X84 (DRAPES) IMPLANT
DRAPE SURG ORHT 6 SPLT 77X108 (DRAPES) ×1 IMPLANT
DRAPE U-SHAPE 47X51 STRL (DRAPES) ×1 IMPLANT
DRILL FLIPCUTTER III 6-12 (ORTHOPEDIC DISPOSABLE SUPPLIES) IMPLANT
DRSG AQUACEL AG ADV 3.5X10 (GAUZE/BANDAGES/DRESSINGS) IMPLANT
DRSG EMULSION OIL 3X3 NADH (GAUZE/BANDAGES/DRESSINGS) ×1 IMPLANT
DRSG TEGADERM 4X4.75 (GAUZE/BANDAGES/DRESSINGS) ×4 IMPLANT
DURAPREP 26ML APPLICATOR (WOUND CARE) ×2 IMPLANT
DW OUTFLOW CASSETTE/TUBE SET (MISCELLANEOUS) ×1 IMPLANT
ELECT REM PT RETURN 9FT ADLT (ELECTROSURGICAL) ×1
ELECTRODE REM PT RTRN 9FT ADLT (ELECTROSURGICAL) ×1 IMPLANT
FLIPCUTTER III 6-12 AR-1204FF (ORTHOPEDIC DISPOSABLE SUPPLIES)
GAUZE PAD ABD 8X10 STRL (GAUZE/BANDAGES/DRESSINGS) ×1 IMPLANT
GAUZE SPONGE 4X4 12PLY STRL (GAUZE/BANDAGES/DRESSINGS) ×1 IMPLANT
GAUZE SPONGE 4X4 12PLY STRL LF (GAUZE/BANDAGES/DRESSINGS) ×1 IMPLANT
GAUZE XEROFORM 1X8 LF (GAUZE/BANDAGES/DRESSINGS) ×2 IMPLANT
GLOVE BIOGEL PI IND STRL 8 (GLOVE) ×1 IMPLANT
GLOVE ECLIPSE 8.0 STRL XLNG CF (GLOVE) ×1 IMPLANT
GLOVE ORTHO TXT STRL SZ7.5 (GLOVE) ×1 IMPLANT
GOWN STRL REUS W/ TWL LRG LVL3 (GOWN DISPOSABLE) ×3 IMPLANT
IMMOBILIZER KNEE 22 UNIV (SOFTGOODS) ×1 IMPLANT
IMMOBILIZER KNEE 24 THIGH 36 (MISCELLANEOUS) IMPLANT
IMMOBILIZER KNEE 24 UNIV (MISCELLANEOUS) ×1
IMP SYS 2ND FIX PEEK 4.75X19.1 (Miscellaneous) ×1 IMPLANT
IMPL FIBERSTITCH 1.5 CVD (Anchor) IMPLANT
IMPL FIBERSTITCH 1.5X24 CVD (Anchor) IMPLANT
IMPL SYS 2ND FX PEEK 4.75X19.1 (Miscellaneous) IMPLANT
IMPLANT FIBERSTITCH 1.5 CVD (Anchor) ×4 IMPLANT
IMPLANT FIBERSTITCH 1.5X24 CVD (Anchor) ×1 IMPLANT
KIT BASIN OR (CUSTOM PROCEDURE TRAY) ×1 IMPLANT
KIT BIOCARTILAGE DEL W/SYRINGE (KITS) IMPLANT
KIT BIOCARTILAGE LG JOINT MIX (KITS) ×1 IMPLANT
KIT TRANSTIBIAL (DISPOSABLE) IMPLANT
KIT TURNOVER KIT B (KITS) ×1 IMPLANT
KNIFE GRAFT ACL 10MM 5952 (MISCELLANEOUS) IMPLANT
MANIFOLD NEPTUNE II (INSTRUMENTS) ×1 IMPLANT
NDL 18GX1X1/2 (RX/OR ONLY) (NEEDLE) ×1 IMPLANT
NDL HYPO 18GX1.5 BLUNT FILL (NEEDLE) ×1 IMPLANT
NEEDLE 18GX1X1/2 (RX/OR ONLY) (NEEDLE) ×1 IMPLANT
NEEDLE HYPO 18GX1.5 BLUNT FILL (NEEDLE) ×1 IMPLANT
NS IRRIG 1000ML POUR BTL (IV SOLUTION) ×1 IMPLANT
PACK ARTHROSCOPY DSU (CUSTOM PROCEDURE TRAY) ×1 IMPLANT
PACK GENERAL/GYN (CUSTOM PROCEDURE TRAY) ×1 IMPLANT
PAD ARMBOARD 7.5X6 YLW CONV (MISCELLANEOUS) ×2 IMPLANT
PAD CAST 4YDX4 CTTN HI CHSV (CAST SUPPLIES) ×2 IMPLANT
PAD COLD SHLDR WRAP-ON (PAD) IMPLANT
PADDING CAST COTTON 6X4 STRL (CAST SUPPLIES) ×2 IMPLANT
PENCIL BUTTON HOLSTER BLD 10FT (ELECTRODE) ×1 IMPLANT
PORTAL SKID DEVICE (INSTRUMENTS) IMPLANT
PUTTY DBM ALLOSYNC PURE 10CC (Putty) IMPLANT
SCREW BIOCOMP 7X20 (Screw) IMPLANT
SCREW BIOCOMPOSITE 8X20 INTER (Screw) IMPLANT
SPIKE FLUID TRANSFER (MISCELLANEOUS) ×1 IMPLANT
SPONGE T-LAP 18X18 ~~LOC~~+RFID (SPONGE) ×1 IMPLANT
SPONGE T-LAP 4X18 ~~LOC~~+RFID (SPONGE) ×2 IMPLANT
STAPLER VISISTAT 35W (STAPLE) ×1 IMPLANT
STOCKINETTE IMPERVIOUS 9X36 MD (GAUZE/BANDAGES/DRESSINGS) IMPLANT
STRIP CLOSURE SKIN 1/2X4 (GAUZE/BANDAGES/DRESSINGS) ×1 IMPLANT
SUCTION TUBE FRAZIER 10FR DISP (SUCTIONS) ×1 IMPLANT
SUT ETHILON 3 0 PS 1 (SUTURE) ×2 IMPLANT
SUT ETHILON 4 0 FS 1 (SUTURE) IMPLANT
SUT MNCRL AB 3-0 PS2 18 (SUTURE) ×1 IMPLANT
SUT MNCRL AB 3-0 PS2 27 (SUTURE) IMPLANT
SUT VIC AB 0 CT1 27XBRD ANBCTR (SUTURE) ×1 IMPLANT
SUT VIC AB 1 CT1 36 (SUTURE) IMPLANT
SUT VIC AB 2-0 CT1 TAPERPNT 27 (SUTURE) ×1 IMPLANT
SUT VIC AB 2-0 CT2 27 (SUTURE) IMPLANT
SUT VICRYL 0 UR6 27IN ABS (SUTURE) ×1 IMPLANT
SUTURE TAPE 1.3 40 TPR END (SUTURE) IMPLANT
SUTURETAPE 1.3 40 TPR END (SUTURE) ×4
SYR 30ML LL (SYRINGE) ×1 IMPLANT
SYR 3ML LL SCALE MARK (SYRINGE) ×1 IMPLANT
SYR BULB IRRIG 60ML STRL (SYRINGE) ×1 IMPLANT
SYR TB 1ML LUER SLIP (SYRINGE) ×1 IMPLANT
TAPE LABRALWHITE 1.5X36 (TAPE) IMPLANT
TOWEL GREEN STERILE (TOWEL DISPOSABLE) ×1 IMPLANT
TOWEL GREEN STERILE FF (TOWEL DISPOSABLE) ×1 IMPLANT
TUBING ARTHROSCOPY IRRIG 16FT (MISCELLANEOUS) ×1 IMPLANT
UNDERPAD 30X36 HEAVY ABSORB (UNDERPADS AND DIAPERS) ×1 IMPLANT
WAND ABLATOR APOLLO I90 (BUR) IMPLANT
WATER STERILE IRR 1000ML POUR (IV SOLUTION) ×1 IMPLANT
WRAP KNEE MAXI GEL POST OP (GAUZE/BANDAGES/DRESSINGS) ×1 IMPLANT
YANKAUER SUCT BULB TIP NO VENT (SUCTIONS) ×1 IMPLANT

## 2023-07-04 NOTE — Brief Op Note (Signed)
   07/04/2023  4:12 PM  PATIENT:  Karl Flores  28 y.o. male  PRE-OPERATIVE DIAGNOSIS:  left knee anterior cruciate ligament tear, medial and later meniscal tears  POST-OPERATIVE DIAGNOSIS:  left knee anterior cruciate ligament tear, medial and later meniscal tears  PROCEDURE:  Procedure(s): LEFT KNEE REVISION ACL RECONSTRUCTION, BONE PATELLA TENDON BONE AUTOGRAFT BILATERAL MENISCAL REPAIR  SURGEON:  Surgeon(s): Cammy Copa, MD  ASSISTANT: Magnant PA  ANESTHESIA:   general  EBL: 10 ml    Total I/O In: 1050 [I.V.:950; IV Piggyback:100] Out: -   BLOOD ADMINISTERED: none  DRAINS: none   LOCAL MEDICATIONS USED: Marcaine morphine clonidine SPECIMEN:  No Specimen  COUNTS:  YES  TOURNIQUET:   Total Tourniquet Time Documented: Thigh (Left) - 34 minutes Total: Thigh (Left) - 34 minutes   DICTATION: .Other Dictation: Dictation Number [id number]  PLAN OF CARE: Discharge to home after PACU  PATIENT DISPOSITION:  PACU - hemodynamically stable

## 2023-07-04 NOTE — Op Note (Signed)
Karl Flores, [facility] MEDICAL RECORD NO: [id number] ACCOUNT NO: [id number] DATE OF BIRTH: 09-Dec-1995 FACILITY: [facility] LOCATION: [facility] PHYSICIAN: Graylin Shiver. August Saucer, MD  Operative Report   DATE OF PROCEDURE: 07/04/2023  PREOPERATIVE DIAGNOSES: 1.  Left knee ACL tear. 2.  Bilateral meniscal tears.  POSTOPERATIVE DIAGNOSES: 1.  Left knee ACL tear. 2.  Bilateral meniscal tears.  PROCEDURES PERFORMED: Left knee revision ACL reconstruction using bone-patellar tendon-bone autograft with bilateral meniscal repairs using all inside technique, Arthrex suture anchors x 2, medial meniscus, one lateral meniscus.  SURGEON: Graylin Shiver. August Saucer, MD  ASSISTANT: Franky Macho Magnet.  INDICATIONS: Karl Flores is a 28 year old patient who is now about 8 years out from left knee ACL reconstruction with hamstring.  He was doing well until he tripped over a barbell in the gym and twisted his knee.  He presents now for operative management of ACL tear and  meniscal pathology.  DESCRIPTION OF PROCEDURE: The patient was brought to the operating room where general anesthetic was induced.  Preoperative antibiotics were administered and timeout was called.  The left leg was examined under anesthesia and found to have full motion in flexion and extension  with good collateral ligament stability to varus and valgus stress at 0 and 30 degrees.  No posterolateral or anteromedial rotatory instability.  ACL laxity was present with positive Lachman, positive anterior drawer.  PCL was intact.  Following  examination under anesthesia, the left leg was prescrubbed with alcohol and Betadine, which was allowed to air dry and prepped with ChloraPrep solution and draped in a sterile manner.  Ioban used to cover the operative field.  A timeout was called.  The  leg was elevated and exsanguinated with Esmarch wrap.  Tourniquet was inflated. Incision beginning around the middle of the patella and extending distally and medially towards the prior  incision from the hamstring harvest was made.  The skin and  subcutaneous tissues were sharply divided.  The paratenon over the patellar tendon was incised from the patella down to the tibial tubercle.  This was developed as a separate layer for later closure.  At this time, using a 10-mm wide harvesting knife, a  bone-patellar tendon-bone autograft was harvested with 9 mm bone plugs x approximately 20 mm in length on each side.  This was done with oscillating saw and osteotomes.  Bone grafting performed with native bone graft into the patellar defect and with  Arthrex bone graft in the tibial defect.  This was prepared on the back table by [facility] PA to 9 x 20 mm tunnel.  And 9 x 20 mm bone plugs with internal brace applied.  Concurrently, the incision was irrigated and the patellar tendon defect was  closed using #1 Vicryl suture.  Bone grafting performed as described.  Paratenon then closed over the patella and then distally until it thinned out using 0 Vicryl suture.  At this time, anterior, inferolateral, and anterior inferomedial portals were  established.  Diagnostic arthroscopy was performed.  ACL graft was torn.  The prior internal brace was also torn.  Debridement was performed and notchplasty performed.  All in all, the prior femoral and tibial tunnels were in good position.  Next, the medial meniscus was inspected.  It did have a tear through the undersurface of the meniscus along the back one-third circumference.  This was prepared using a shaver and a rasp.  Next, two Arthrex suture anchors were placed, one from the medial  portal, one from the lateral portal set at 14  mm.  Two vertical mattress sutures gave very nice reapproximation of the tear.  This was only a partial-thickness tear, but it was likely to progress prior to fixation.  On the lateral side, there was also a  near full-thickness vertical tear just at the level of the popliteus tendon extending about 1 cm posterior.  This was  also prepared using a meniscal rasp and an Arthrex all-inside suture anchor device set at 14 mm was used to achieve a very nice repair  vertical mattress.  Next, the tibial tunnel was drilled at 50 degrees.  This was done with a 9 mm Acorn reamer.  Over-the-top guide was then placed and the femoral tunnel was drilled in approximately the 2 o'clock position.  About a 2 mm back wall was present.  Graft was  then passed and secured on the femoral side after placing bone graft in the femoral tunnel.  That was done with a 7 x 20 mm interference screw with excellent fixation achieved.  Graft was countersunk about 1 cm into the tibial tunnel and the knee was  taken through a range of motion for about 20 reps and then was secured in extension using an 8 mm x 20 interference screw with backup fixation and a SwiveLock in extension including the internal brace.  Very stable knee was created with about 1 mm  anterior drawer solid endpoint both in extension as well as flexion.  Arthroscopically, the graft appeared intact and had a nice tension.  Thorough irrigation was then performed and the remaining synthetic bone graft was placed in the tibial tunnel.   Knee joint was thoroughly irrigated using about 3 liters of irrigating solution.  Portals closed using #1 Vicryl suture.  The soft tissue over the tibial tunnel drill site was then closed using #1 Vicryl suture.  Incision was then closed using 0 Vicryl  suture, 2-0 Vicryl suture, and 3-0 Monocryl with Steri-Strips applied.  The knee joint injected with a solution of Marcaine, morphine, and clonidine for postop pain relief.  The patient tolerated the procedure well without immediate complications and was  transferred to the recovery room in stable condition.  It should be noted that he did have some early grade I and mild grade II changes in both the medial and lateral compartments.  There were no loose bodies in the medial or lateral gutters.  Impervious dressing,  Ace wrap, and knee immobilizer placed.  Luke's assistance was required at all times for retraction, opening and closing mobilization of tissue as well as graft preparation, which was done by placing two FiberTape sutures through each bone plug-in through a drill hole along with the internal  brace.  His assistance was medical necessity.    MUK D: 07/04/2023 4:21:41 pm T: 07/04/2023 8:26:00 pm  JOB: [id number]/ [id number]

## 2023-07-04 NOTE — Anesthesia Preprocedure Evaluation (Addendum)
Anesthesia Evaluation  Patient identified by MRN, date of birth, ID band Patient awake    Reviewed: Allergy & Precautions, NPO status , Patient's Chart, lab work & pertinent test results  Airway Mallampati: II  TM Distance: >3 FB Neck ROM: Full    Dental  (+) Teeth Intact, Dental Advisory Given   Pulmonary Current Smoker and Patient abstained from smoking. socially   Pulmonary exam normal breath sounds clear to auscultation       Cardiovascular negative cardio ROS Normal cardiovascular exam Rhythm:Regular Rate:Normal     Neuro/Psych negative neurological ROS  negative psych ROS   GI/Hepatic negative GI ROS, Neg liver ROS,,,  Endo/Other  negative endocrine ROS    Renal/GU negative Renal ROS  negative genitourinary   Musculoskeletal negative musculoskeletal ROS (+)    Abdominal   Peds  Hematology negative hematology ROS (+)   Anesthesia Other Findings   Reproductive/Obstetrics negative OB ROS                             Anesthesia Physical Anesthesia Plan  ASA: 1  Anesthesia Plan: General and Regional   Post-op Pain Management: Tylenol PO (pre-op)*, Toradol IV (intra-op)*, Precedex, Ketamine IV*, Dilaudid IV and Regional block*   Induction: Intravenous  PONV Risk Score and Plan: 1 and Ondansetron, Dexamethasone, Midazolam and Treatment may vary due to age or medical condition  Airway Management Planned: Oral ETT  Additional Equipment: None  Intra-op Plan:   Post-operative Plan: Extubation in OR  Informed Consent: I have reviewed the patients History and Physical, chart, labs and discussed the procedure including the risks, benefits and alternatives for the proposed anesthesia with the patient or authorized representative who has indicated his/her understanding and acceptance.     Dental advisory given  Plan Discussed with: CRNA  Anesthesia Plan Comments:          Anesthesia Quick Evaluation

## 2023-07-04 NOTE — H&P (Signed)
Karl Flores is an 28 y.o. male.   Chief Complaint: left knee instability HPI: Karl Flores is a 28 y.o. male who presents  reporting left knee pain.  Since he was last seen has had an MRI scan.  He had hamstring autograft ACL reconstruction along with posterolateral corner reconstruction 7 years ago.  Was doing very well until he sustained an injury in the weight room.  Had an impact twisting type injury when he tripped over a dumbbell on the floor.  MRI scan is reviewed and it does show tear of the ACL graft along with meniscocapsular junction of the medial meniscus.  Lateral meniscus appears intact.  Collaterals appear to have some edema particularly the MCL but no discrete tearing.  There is also some edema on the lateral side but the lateral collateral ligament is structurally intact on the scan.   Patient would like to travel to [place] in January.  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  He typically just works out for exercise and does not do too much in terms of cutting and pivoting exercises..No h/o dvt or pe.    History reviewed. No pertinent past medical history.  Past Surgical History:  Procedure Laterality Date   COLONOSCOPY     KNEE ARTHROSCOPY WITH ANTERIOR CRUCIATE LIGAMENT (ACL) REPAIR WITH HAMSTRING GRAFT Left 12/04/2016   Procedure: KNEE ARTHROSCOPY WITH ANTERIOR CRUCIATE LIGAMENT (ACL) REPAIR WITH HAMSTRING GRAFT WITH POSSIBLE POSTROLATERAL CORNER RECONSTRUCTION AND LATERAL MENISCAL REPAIR VERSUS RESECTION;  Surgeon: Cammy Copa, MD;  Location: [facility] OR;  Service: Orthopedics;  Laterality: Left;   KNEE ARTHROSCOPY WITH MENISCAL REPAIR Left 12/04/2016   Procedure: KNEE ARTHROSCOPY WITH MENISCAL REPAIR;  Surgeon: Cammy Copa, MD;  Location: [facility] OR;  Service: Orthopedics;  Laterality: Left;   WISDOM TOOTH EXTRACTION      Family History  Problem Relation Age of Onset   Diabetes Father    Hyperlipidemia Father    Hypertension Father    Heart disease Father     Ulcerative colitis Brother    Stroke Paternal Grandmother    Diabetes Paternal Grandfather    Heart disease Paternal Grandfather    Colon cancer Neg Hx    Esophageal cancer Neg Hx    Stomach cancer Neg Hx    Rectal cancer Neg Hx    Social History:  reports that he has been smoking cigarettes. He has never used smokeless tobacco. He reports that he does not drink alcohol and does not use drugs.  Allergies:  Allergies  Allergen Reactions   Penicillins Other (See Comments)    Has patient had a PCN reaction causing immediate rash, facial/tongue/throat swelling, SOB or lightheadedness with hypotension: Unknown Has patient had a PCN reaction causing severe rash involving mucus membranes or skin necrosis: Unknown Has patient had a PCN reaction that required hospitalization: Unknown Has patient had a PCN reaction occurring within the last 10 years: No Unknown Childhood reaction  If all of the above answers are "NO", then may proceed with Cephalosporin use.      Medications Prior to Admission  Medication Sig Dispense Refill   ibuprofen (ADVIL) 800 MG tablet Take 1 tablet (800 mg total) by mouth every 8 (eight) hours as needed for moderate pain. 21 tablet 0   oxyCODONE-acetaminophen (PERCOCET/ROXICET) 5-325 MG tablet Take 1 tablet by mouth every 8 (eight) hours as needed for severe pain (pain score 7-10). 10 tablet 0    No results found for this or any previous visit (from  the past 48 hours). No results found.  Review of Systems  Musculoskeletal:  Positive for arthralgias.  All other systems reviewed and are negative.   Blood pressure 132/80, pulse 69, temperature 98.1 F (36.7 C), temperature source Oral, resp. rate 18, height 6' (1.829 m), weight 103 kg, SpO2 100%. Physical Exam Vitals reviewed.  HENT:     Head: Normocephalic.     Nose: Nose normal.     Mouth/Throat:     Mouth: Mucous membranes are moist.  Eyes:     Pupils: Pupils are equal, round, and reactive to light.   Cardiovascular:     Rate and Rhythm: Normal rate.     Pulses: Normal pulses.  Pulmonary:     Effort: Pulmonary effort is normal.  Abdominal:     General: Abdomen is flat.  Musculoskeletal:     Cervical back: Normal range of motion.  Skin:    General: Skin is warm.     Capillary Refill: Capillary refill takes less than 2 seconds.  Neurological:     General: No focal deficit present.     Mental Status: He is alert.  Psychiatric:        Mood and Affect: Mood normal.    Ortho exam demonstrates palpable pedal pulses. Left knee has full extension No patellar instability is present. Quad tendon and patellar tendon are functional but he does have some atrophy in the leg already. No calf tenderness negative Homans . Collaterals feel stable to varus valgus stress at 0 and 30 degrees. No posterolateral rotatory instability. ACL laxity is present with positive Lachman positive anterior drawer with soft endpoint after 4 5 mm of anterior translation. PCL intact. Flexes easily past 90 degrees.  Assessment/Plan Impression is left knee ACL tear with instability and medial meniscal pathology. Collateral ligaments feel stable and there is no posterolateral rotatory instability on somewhat guarded exam today.  Plan for ACL reconstruction with bone patellar tendon bone autograft and medial meniscal repair. The risk and benefits are discussed with the patient including not limited to infection knee stiffness nerve and vessel damage along with incomplete restoration of function and incomplete pain relief. Extensive nature of the rehabilitative process is discussed including a period of nonweightbearing for the medial meniscal repair. Based on the location of the tear this may be an inside-out repair versus all inside repair. Patient understands the risks and benefits and wishes to get this done as soon as possible   Karl Bunting, MD 07/04/2023, 10:15 AM

## 2023-07-04 NOTE — Anesthesia Procedure Notes (Signed)
Procedure Name: Intubation Date/Time: 07/04/2023 12:25 PM  Performed by: Orlin Hilding, CRNAPre-anesthesia Checklist: Patient identified, Emergency Drugs available, Suction available, Patient being monitored and Timeout performed Patient Re-evaluated:Patient Re-evaluated prior to induction Oxygen Delivery Method: Circle system utilized Preoxygenation: Pre-oxygenation with 100% oxygen Induction Type: IV induction Ventilation: Mask ventilation without difficulty and Oral airway inserted - appropriate to patient size Laryngoscope Size: Mac and 4 Grade View: Grade I Tube type: Oral Tube size: 7.5 mm Number of attempts: 1 Placement Confirmation: ETT inserted through vocal cords under direct vision, positive ETCO2 and breath sounds checked- equal and bilateral Secured at: 24 cm Tube secured with: Tape Dental Injury: Teeth and Oropharynx as per pre-operative assessment

## 2023-07-04 NOTE — Transfer of Care (Signed)
Immediate Anesthesia Transfer of Care Note  Patient: XYLAN LAMPSHIRE  Procedure(s) Performed: LEFT KNEE REVISION ACL RECONSTRUCTION, BONE PATELLA TENDON BONE AUTOGRAFT (Left) BILATERAL MENISCAL REPAIR (Left)  Patient Location: PACU  Anesthesia Type:General and Regional  Level of Consciousness: drowsy  Airway & Oxygen Therapy: Patient Spontanous Breathing and Patient connected to nasal cannula oxygen  Post-op Assessment: Report given to RN  Post vital signs: stable  Last Vitals:  Vitals Value Taken Time  BP 127/78 07/04/23 1616  Temp    Pulse 92 07/04/23 1619  Resp 14 07/04/23 1618  SpO2 98 % 07/04/23 1619  Vitals shown include unfiled device data.  Last Pain:  Vitals:   07/04/23 0924  TempSrc:   PainSc: 0-No pain         Complications: No notable events documented.

## 2023-07-05 ENCOUNTER — Telehealth: Payer: Self-pay | Admitting: Orthopedic Surgery

## 2023-07-05 NOTE — Telephone Encounter (Signed)
Patient called to let Dr. August Saucer know that he is doing well. Patient advised he is not in any pain and was take the pain medicine per instructions as needed. Patient said he really don't like taking pain medication. Patient said he is taking the medication Celecoxib 100 mg 2 times as a day as instructed.   Patient said overall he is doing well.  Patient number  [phone]

## 2023-07-05 NOTE — Anesthesia Postprocedure Evaluation (Signed)
Anesthesia Post Note  Patient: KEALON BOOR  Procedure(s) Performed: LEFT KNEE REVISION ACL RECONSTRUCTION, BONE PATELLA TENDON BONE AUTOGRAFT (Left) BILATERAL MENISCAL REPAIR (Left)     Patient location during evaluation: PACU Anesthesia Type: Regional Level of consciousness: awake and alert and oriented Pain management: pain level controlled Vital Signs Assessment: post-procedure vital signs reviewed and stable Respiratory status: spontaneous breathing, nonlabored ventilation and respiratory function stable Cardiovascular status: blood pressure returned to baseline and stable Postop Assessment: no headache, no backache, spinal receding and patient able to bend at knees Anesthetic complications: no   No notable events documented.  Last Vitals:  Vitals:   07/04/23 1645 07/04/23 1700  BP: 134/82 139/88  Pulse: 98 93  Resp: 15 17  Temp:  36.7 C  SpO2: 100% 100%    Last Pain:  Vitals:   07/04/23 1700  TempSrc:   PainSc: Asleep                 Lannie Fields

## 2023-07-05 NOTE — Telephone Encounter (Signed)
Ok thanks 

## 2023-07-08 ENCOUNTER — Encounter (HOSPITAL_COMMUNITY): Payer: Self-pay | Admitting: Orthopedic Surgery

## 2023-07-12 ENCOUNTER — Encounter: Payer: Self-pay | Admitting: Surgical

## 2023-07-12 ENCOUNTER — Ambulatory Visit (INDEPENDENT_AMBULATORY_CARE_PROVIDER_SITE_OTHER): Payer: Medicaid Other | Admitting: Surgical

## 2023-07-12 DIAGNOSIS — Z9889 Other specified postprocedural states: Secondary | ICD-10-CM

## 2023-07-12 NOTE — Progress Notes (Signed)
Post-Op Visit Note   Patient: Karl Flores           Date of Birth: 11/17/1995           MRN: [id number] Visit Date: 07/12/2023 PCP: Loyola Mast, MD   Assessment & Plan:  Chief Complaint:  Chief Complaint  Patient presents with   Left Knee - Routine Post Op    07/04/23 left ACL revision reconstruction   Visit Diagnoses:  1. S/P ACL reconstruction     Plan: Patient is a 28 year old male who presents s/p left anterior cruciate ligament revision reconstruction with bone patellar tendon bone autograft and medial/lateral all inside meniscal repairs on 07/04/2023.  Patient states that pain is controlled.  He has been nonweightbearing with crutches since procedure.  Has been compliant with this restriction.  Using CPM machine up to 75 degrees.  No fevers or chills consistently.  No chest pain or shortness of breath or calf pain.  Taking aspirin twice daily for DVT prophylaxis.  Just taking Celebrex for pain control.  On exam, patient has small effusion.  Incision looks to be healing well without evidence of infection or dehiscence.  No calf tenderness bilaterally.  Negative Homans' sign bilaterally.  Palpable DP pulse.  Intact ankle dorsiflexion and plantarflexion.  Not able to perform straight leg raise though quad does fire on exam today.  ACL graft is stable on Lachman exam.  He has range of motion from about 3 degrees extension to 90 degrees of knee flexion.  Plan is to continue with range of motion exercises with the CPM machine.  Also needs to focus on achieving full knee extension which he is very slightly lacking today.  ACL graft has excellent stability on exam today.  Recommended he continue with trying to perform straight leg raises in order to optimize early quadricep strength.  Continue nonweightbearing until 1 month out from surgery.  He will follow-up for clinical recheck with Dr. August Saucer in about 2-1/2 weeks which will mark 4 weeks out from surgery.  At that point, anticipate  discontinuing crutches and getting started with physical therapy.  Follow-Up Instructions: No follow-ups on file.   Orders:  No orders of the defined types were placed in this encounter.  No orders of the defined types were placed in this encounter.   Imaging: No results found.  PMFS History: There are no active problems to display for this patient.  No past medical history on file.  Family History  Problem Relation Age of Onset   Diabetes Father    Hyperlipidemia Father    Hypertension Father    Heart disease Father    Ulcerative colitis Brother    Stroke Paternal Grandmother    Diabetes Paternal Grandfather    Heart disease Paternal Grandfather    Colon cancer Neg Hx    Esophageal cancer Neg Hx    Stomach cancer Neg Hx    Rectal cancer Neg Hx     Past Surgical History:  Procedure Laterality Date   ANTERIOR CRUCIATE LIGAMENT REPAIR Left 07/04/2023   Procedure: LEFT KNEE REVISION ACL RECONSTRUCTION, BONE PATELLA TENDON BONE AUTOGRAFT;  Surgeon: Cammy Copa, MD;  Location: [facility] OR;  Service: Orthopedics;  Laterality: Left;   COLONOSCOPY     HARDWARE REMOVAL Left 07/04/2023   Procedure: BILATERAL MENISCAL REPAIR;  Surgeon: Cammy Copa, MD;  Location: [facility] OR;  Service: Orthopedics;  Laterality: Left;   KNEE ARTHROSCOPY WITH ANTERIOR CRUCIATE LIGAMENT (ACL) REPAIR WITH HAMSTRING GRAFT Left  12/04/2016   Procedure: KNEE ARTHROSCOPY WITH ANTERIOR CRUCIATE LIGAMENT (ACL) REPAIR WITH HAMSTRING GRAFT WITH POSSIBLE POSTROLATERAL CORNER RECONSTRUCTION AND LATERAL MENISCAL REPAIR VERSUS RESECTION;  Surgeon: Cammy Copa, MD;  Location: [facility] OR;  Service: Orthopedics;  Laterality: Left;   KNEE ARTHROSCOPY WITH MENISCAL REPAIR Left 12/04/2016   Procedure: KNEE ARTHROSCOPY WITH MENISCAL REPAIR;  Surgeon: Cammy Copa, MD;  Location: [facility] OR;  Service: Orthopedics;  Laterality: Left;   WISDOM TOOTH EXTRACTION     Social History   Occupational History   Occupation:  Research officer, political party    Comment: [place]  Tobacco Use   Smoking status: Every Day    Types: Cigarettes   Smokeless tobacco: Never   Tobacco comments:    hookah once a week; smokes 1 cigarette a day  Vaping Use   Vaping status: Never Used  Substance and Sexual Activity   Alcohol use: Never   Drug use: No   Sexual activity: Never    Birth control/protection: None

## 2023-07-18 DIAGNOSIS — S83282A Other tear of lateral meniscus, current injury, left knee, initial encounter: Secondary | ICD-10-CM

## 2023-07-18 DIAGNOSIS — S83242A Other tear of medial meniscus, current injury, left knee, initial encounter: Secondary | ICD-10-CM

## 2023-07-18 DIAGNOSIS — S83511A Sprain of anterior cruciate ligament of right knee, initial encounter: Secondary | ICD-10-CM

## 2023-07-29 ENCOUNTER — Encounter: Payer: Self-pay | Admitting: Family Medicine

## 2023-07-29 ENCOUNTER — Ambulatory Visit (INDEPENDENT_AMBULATORY_CARE_PROVIDER_SITE_OTHER): Payer: Medicaid Other | Admitting: Family Medicine

## 2023-07-29 VITALS — BP 122/76 | HR 76 | Temp 97.7°F | Ht 72.0 in | Wt 224.0 lb

## 2023-07-29 DIAGNOSIS — K649 Unspecified hemorrhoids: Secondary | ICD-10-CM | POA: Insufficient documentation

## 2023-07-29 MED ORDER — HYDROCORTISONE ACETATE 25 MG RE SUPP: 25.0000 mg | Freq: Two times a day (BID) | RECTAL | 0 refills | Status: DC

## 2023-07-29 NOTE — Assessment & Plan Note (Signed)
 I will prescribe Anusol  suppositories as this appears to be more of an issue of internal hemorrhoids. I will refer him to a rectal surgery to consider a procedure for dealing with these.

## 2023-07-29 NOTE — Progress Notes (Signed)
 [facility] [address] [place] [address] Dept: [phone] Dept Fax: [phone]  Office Visit  Subjective:    Patient ID: Karl Flores, male    DOB: 15-Nov-1995, 28 y.o..   MRN: [id number]  Chief Complaint  Patient presents with   Hemorrhoids    C/o having hemorrhoids.     History of Present Illness:  Patient is in today with a complaint of rectal bleeding and hemorrhoids. Karl Flores had been previously identified as having both internal and external hemorrhoids in 2023. At that time, he underwent a colonoscopy. He notes that more recently, he has been having significant hematochezia with every bowel movement. He wonder sif he is having some prolapse of his internal hemorrhoid. He is not having pain at this point. Karl Flores had left knee surgery 3 weeks ago. He notes he limited his opioid use after the surgery out of fear of constipation. He notrmally has two soft bowel movements daily.  Past Medical History: Patient Active Problem List   Diagnosis Date Noted   Rupture of anterior cruciate ligament of right knee 07/18/2023   Acute medial meniscus tear of left knee 07/18/2023   Acute lateral meniscus tear of left knee 07/18/2023   Past Surgical History:  Procedure Laterality Date   ANTERIOR CRUCIATE LIGAMENT REPAIR Left 07/04/2023   Procedure: LEFT KNEE REVISION ACL RECONSTRUCTION, BONE PATELLA TENDON BONE AUTOGRAFT;  Surgeon: Jasmine Mesi, MD;  Location: [facility] OR;  Service: Orthopedics;  Laterality: Left;   COLONOSCOPY     HARDWARE REMOVAL Left 07/04/2023   Procedure: BILATERAL MENISCAL REPAIR;  Surgeon: Jasmine Mesi, MD;  Location: [facility] OR;  Service: Orthopedics;  Laterality: Left;   KNEE ARTHROSCOPY WITH ANTERIOR CRUCIATE LIGAMENT (ACL) REPAIR WITH HAMSTRING GRAFT Left 12/04/2016   Procedure: KNEE ARTHROSCOPY WITH ANTERIOR CRUCIATE LIGAMENT (ACL) REPAIR WITH HAMSTRING GRAFT WITH POSSIBLE POSTROLATERAL CORNER  RECONSTRUCTION AND LATERAL MENISCAL REPAIR VERSUS RESECTION;  Surgeon: Jasmine Mesi, MD;  Location: [facility] OR;  Service: Orthopedics;  Laterality: Left;   KNEE ARTHROSCOPY WITH MENISCAL REPAIR Left 12/04/2016   Procedure: KNEE ARTHROSCOPY WITH MENISCAL REPAIR;  Surgeon: Jasmine Mesi, MD;  Location: [facility] OR;  Service: Orthopedics;  Laterality: Left;   WISDOM TOOTH EXTRACTION     Family History  Problem Relation Age of Onset   Diabetes Father    Hyperlipidemia Father    Hypertension Father    Heart disease Father    Ulcerative colitis Brother    Stroke Paternal Grandmother    Diabetes Paternal Grandfather    Heart disease Paternal Grandfather    Colon cancer Neg Hx    Esophageal cancer Neg Hx    Stomach cancer Neg Hx    Rectal cancer Neg Hx    Outpatient Medications Prior to Visit  Medication Sig Dispense Refill   aspirin  (ASPIRIN  CHILDRENS) 81 MG chewable tablet Chew 1 tablet (81 mg total) by mouth 2 (two) times daily. To prevent blood clots 60 tablet 0   celecoxib  (CELEBREX ) 100 MG capsule Take 1 capsule (100 mg total) by mouth 2 (two) times daily. 60 capsule 0   methocarbamol  (ROBAXIN ) 500 MG tablet Take 1 tablet (500 mg total) by mouth every 8 (eight) hours as needed for muscle spasms. (Patient not taking: Reported on 07/29/2023) 30 tablet 1   oxyCODONE  (ROXICODONE ) 5 MG immediate release tablet Take 1 tablet (5 mg total) by mouth every 4 (four) hours as needed for severe pain (pain score 7-10). (Patient not  taking: Reported on 07/29/2023) 30 tablet 0   No facility-administered medications prior to visit.   Allergies  Allergen Reactions   Penicillins Other (See Comments)    Has patient had a PCN reaction causing immediate rash, facial/tongue/throat swelling, SOB or lightheadedness with hypotension: Unknown Has patient had a PCN reaction causing severe rash involving mucus membranes or skin necrosis: Unknown Has patient had a PCN reaction that required hospitalization:  Unknown Has patient had a PCN reaction occurring within the last 10 years: No Unknown Childhood reaction  If all of the above answers are "NO", then may proceed with Cephalosporin use.       Objective:   Today's Vitals   07/29/23 1327  BP: 122/76  Pulse: 76  Temp: 97.7 F (36.5 C)  TempSrc: Temporal  SpO2: 99%  Weight: 224 lb (101.6 kg)  Height: 6' (1.829 m)   Body mass index is 30.38 kg/m.   General: Well developed, well nourished. No acute distress. Rectum: There are mildly red, but soft external hemorrhoids. There is no visible anal fissure notes. Psych: Alert and oriented. Normal mood and affect.  Health Maintenance Due  Topic Date Due   Pneumococcal Vaccine 13-42 Years old (1 of 2 - PCV) Never done     Assessment & Plan:   Problem List Items Addressed This Visit       Cardiovascular and Mediastinum   Hemorrhoids - Primary   I will prescribe Anusol  suppositories as this appears to be more of an issue of internal hemorrhoids. I will refer him to a rectal surgery to consider a procedure for dealing with these.      Relevant Medications   hydrocortisone  (ANUSOL -HC) 25 MG suppository   Other Relevant Orders   Ambulatory referral to General Surgery    Return if symptoms worsen or fail to improve.   Graig Lawyer, MD

## 2023-07-30 NOTE — Progress Notes (Signed)
I called to check on him.

## 2023-08-05 ENCOUNTER — Ambulatory Visit (INDEPENDENT_AMBULATORY_CARE_PROVIDER_SITE_OTHER): Payer: Medicaid Other | Admitting: Orthopedic Surgery

## 2023-08-05 ENCOUNTER — Encounter: Payer: Self-pay | Admitting: Orthopedic Surgery

## 2023-08-05 DIAGNOSIS — Z9889 Other specified postprocedural states: Secondary | ICD-10-CM

## 2023-08-05 NOTE — Progress Notes (Signed)
 Post-Op Visit Note   Patient: Karl Flores           Date of Birth: Dec 13, 1995           MRN: [id number] Visit Date: 08/05/2023 PCP: Loyola Mast, MD   Assessment & Plan:  Chief Complaint:  Chief Complaint  Patient presents with   Left Knee - Routine Post Op     left anterior cruciate ligament revision reconstruction with bone patellar tendon bone autograft and medial/lateral all inside meniscal repairs on 07/04/2023   Visit Diagnoses:  1. S/P ACL reconstruction     Plan: Patient is now 4 weeks out left knee ACL revision reconstruction with bone patella tendon bone autograft and medial and lateral all inside meniscal repairs.  He did get up to 110 on the CPM machine.  On exam no calf tenderness and trace effusion.  Flexion 3-1 10.  Graft is very stable in extension with less than a millimeter anterior drawer and and flexion about 1/2 mm with solid endpoint.  No joint line tenderness.  Plan at this time is to start physical therapy at [facility] farm with no loaded flexion more than 90 degrees weightbearing as tolerated.  Okay for range of motion and strengthening.  No open chain exercises.  Stationary bike okay to use.  He is not taking any pain meds.  We showed him how to progress from nonweightbearing to partial weightbearing to weightbearing as tolerated more likely than not by this weekend.  Follow-Up Instructions: No follow-ups on file.   Orders:  Orders Placed This Encounter  Procedures   Ambulatory referral to Physical Therapy   No orders of the defined types were placed in this encounter.   Imaging: No results found.  PMFS History: Patient Active Problem List   Diagnosis Date Noted   Hemorrhoids 07/29/2023   Rupture of anterior cruciate ligament of right knee 07/18/2023   Acute medial meniscus tear of left knee 07/18/2023   Acute lateral meniscus tear of left knee 07/18/2023   No past medical history on file.  Family History  Problem Relation Age of Onset    Diabetes Father    Hyperlipidemia Father    Hypertension Father    Heart disease Father    Ulcerative colitis Brother    Stroke Paternal Grandmother    Diabetes Paternal Grandfather    Heart disease Paternal Grandfather    Colon cancer Neg Hx    Esophageal cancer Neg Hx    Stomach cancer Neg Hx    Rectal cancer Neg Hx     Past Surgical History:  Procedure Laterality Date   ANTERIOR CRUCIATE LIGAMENT REPAIR Left 07/04/2023   Procedure: LEFT KNEE REVISION ACL RECONSTRUCTION, BONE PATELLA TENDON BONE AUTOGRAFT;  Surgeon: Cammy Copa, MD;  Location: [facility] OR;  Service: Orthopedics;  Laterality: Left;   COLONOSCOPY     HARDWARE REMOVAL Left 07/04/2023   Procedure: BILATERAL MENISCAL REPAIR;  Surgeon: Cammy Copa, MD;  Location: [facility] OR;  Service: Orthopedics;  Laterality: Left;   KNEE ARTHROSCOPY WITH ANTERIOR CRUCIATE LIGAMENT (ACL) REPAIR WITH HAMSTRING GRAFT Left 12/04/2016   Procedure: KNEE ARTHROSCOPY WITH ANTERIOR CRUCIATE LIGAMENT (ACL) REPAIR WITH HAMSTRING GRAFT WITH POSSIBLE POSTROLATERAL CORNER RECONSTRUCTION AND LATERAL MENISCAL REPAIR VERSUS RESECTION;  Surgeon: Cammy Copa, MD;  Location: [facility] OR;  Service: Orthopedics;  Laterality: Left;   KNEE ARTHROSCOPY WITH MENISCAL REPAIR Left 12/04/2016   Procedure: KNEE ARTHROSCOPY WITH MENISCAL REPAIR;  Surgeon: Cammy Copa, MD;  Location: [facility]  OR;  Service: Orthopedics;  Laterality: Left;   WISDOM TOOTH EXTRACTION     Social History   Occupational History   Occupation: Research officer, political party    Comment: [employer]  Tobacco Use   Smoking status: Every Day    Types: Cigarettes   Smokeless tobacco: Never   Tobacco comments:    hookah once a week; smokes 1 cigarette a day  Vaping Use   Vaping status: Never Used  Substance and Sexual Activity   Alcohol use: Never   Drug use: No   Sexual activity: Never    Birth control/protection: None

## 2023-08-15 NOTE — Therapy (Signed)
 OUTPATIENT PHYSICAL THERAPY LOWER EXTREMITY EVALUATION   Patient Name: Karl Flores MRN: [id number] DOB:02/25/1996, 28 y.o., male Today's Date: 08/16/2023  END OF SESSION:  PT End of Session - 08/16/23 1014     Visit Number 1    Date for PT Re-Evaluation 11/08/23    Authorization Type [place] Medicaid    PT Start Time 1015    PT Stop Time 1100    PT Time Calculation (min) 45 min    Activity Tolerance Patient tolerated treatment well    Behavior During Therapy [facility] for tasks assessed/performed             History reviewed. No pertinent past medical history. Past Surgical History:  Procedure Laterality Date   ANTERIOR CRUCIATE LIGAMENT REPAIR Left 07/04/2023   Procedure: LEFT KNEE REVISION ACL RECONSTRUCTION, BONE PATELLA TENDON BONE AUTOGRAFT;  Surgeon: Cammy Copa, MD;  Location: [facility] OR;  Service: Orthopedics;  Laterality: Left;   COLONOSCOPY     HARDWARE REMOVAL Left 07/04/2023   Procedure: BILATERAL MENISCAL REPAIR;  Surgeon: Cammy Copa, MD;  Location: [facility] OR;  Service: Orthopedics;  Laterality: Left;   KNEE ARTHROSCOPY WITH ANTERIOR CRUCIATE LIGAMENT (ACL) REPAIR WITH HAMSTRING GRAFT Left 12/04/2016   Procedure: KNEE ARTHROSCOPY WITH ANTERIOR CRUCIATE LIGAMENT (ACL) REPAIR WITH HAMSTRING GRAFT WITH POSSIBLE POSTROLATERAL CORNER RECONSTRUCTION AND LATERAL MENISCAL REPAIR VERSUS RESECTION;  Surgeon: Cammy Copa, MD;  Location: [facility] OR;  Service: Orthopedics;  Laterality: Left;   KNEE ARTHROSCOPY WITH MENISCAL REPAIR Left 12/04/2016   Procedure: KNEE ARTHROSCOPY WITH MENISCAL REPAIR;  Surgeon: Cammy Copa, MD;  Location: [facility] OR;  Service: Orthopedics;  Laterality: Left;   WISDOM TOOTH EXTRACTION     Patient Active Problem List   Diagnosis Date Noted   Hemorrhoids 07/29/2023   Rupture of anterior cruciate ligament of right knee 07/18/2023   Acute medial meniscus tear of left knee 07/18/2023   Acute lateral meniscus tear of left knee 07/18/2023     PCP: Herbie Drape  REFERRING PROVIDER: Cammy Copa  REFERRING DIAG:  [phone] (ICD-10-CM) - S/P ACL reconstruction    THERAPY DIAG:  S/P ACL reconstruction  Rationale for Evaluation and Treatment: Rehabilitation  ONSET DATE: 05/21/23- when I tore it, surgery was 07/04/23  SUBJECTIVE:   SUBJECTIVE STATEMENT: I am good feeling fine. The knee is okay, still numb. My problem is getting it straight. The bend is pretty good.   PERTINENT HISTORY: Left anterior cruciate ligament revision reconstruction with bone patellar tendon bone autograft and medial/lateral meniscal repairs on 1/16/202   ACL reconstruction in 2018 w/hamstring graft and meniscal repair L    PAIN:  Are you having pain? No Only some pain after walking and it gets stiff   PRECAUTIONS: Knee  RED FLAGS: None   WEIGHT BEARING RESTRICTIONS:  PT left knee no loaded flexion beyond 90, WBAT, ROM and strengthening, no open chain, stationary bike.  FALLS:  Has patient fallen in last 6 months? Yes. 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  DIAGNOSTIC FINDINGS:  1. Complete tear of the ACL graft.  2. Partial midsubstance PCL tear. 3. Complex far peripheral tear involving the posterior horn of the medial meniscus. 4. Small longitudinal tear involving the inferior articular surface of the posterior horn of the lateral meniscus. 5. Fibular collateral ligament sprain. No complete tear. 6. Proximal MCL sprain and significant acute/traumatic MCL and pes anserine bursitis. 7. Bone contusion involving  the posterior aspect of the lateral tibia anterior aspect of the medial tibia. 8. Large joint effusion. 9. Proximal popliteus muscle strain/partial tear.   COGNITION: Overall cognitive status: Within functional limits for tasks assessed     SENSATION: WFL   MUSCLE LENGTH: Hamstrings: tightness in LLE  POSTURE: No Significant postural limitations  PALPATION: Some swelling in L knee  LOWER EXTREMITY ROM:  Active ROM Right eval Left eval  Hip flexion    Hip extension    Hip abduction    Hip adduction    Hip internal rotation    Hip external rotation    Knee flexion  115  Knee extension  -10 with pain  Ankle dorsiflexion    Ankle plantarflexion    Ankle inversion    Ankle eversion     (Blank rows = not tested)  LOWER EXTREMITY MMT:  MMT Right eval Left eval  Hip flexion    Hip extension    Hip abduction    Hip adduction    Hip internal rotation    Hip external rotation    Knee flexion  3+ with pain  Knee extension  4-   Ankle dorsiflexion    Ankle plantarflexion    Ankle inversion    Ankle eversion     (Blank rows = not tested)  FUNCTIONAL TESTS:  5 times sit to stand: 15.37s Timed up and go (TUG): 19.17s  GAIT: Distance walked: in clinic distances  Assistive device utilized: Crutches 1 crutch  Level of assistance: Modified independence Comments: antalgic pattern, decrease stance time and knee flexion on LLE                                                                                                                                TREATMENT DATE: 08/16/23- EVAL   PATIENT EDUCATION:  Education details: poc and HEP Person educated: Patient Education method: Explanation Education comprehension: verbalized understanding  HOME EXERCISE PROGRAM: Access Code: [email or web address] URL: [email or web address] Date: 08/16/2023 Prepared by: Cassie Freer  Exercises - Supine Quad Set  - 1 x daily - 7 x weekly - 2 sets - 10 reps - Active Straight  Leg Raise with Quad Set  - 1 x daily - 7 x weekly - 2 sets - 10 reps - Sit to Stand  - 1 x daily - 7 x weekly - 2 sets - 10 reps - Standing March with Counter Support  - 1 x daily - 7 x weekly - 2 sets - 10 reps - Standing Knee Flexion with Counter Support  - 1 x daily - 7 x weekly -  2 sets - 10 reps - Heel Raises with Counter Support  - 1 x daily - 7 x weekly - 2 sets - 10 reps  ASSESSMENT:  CLINICAL IMPRESSION: Patient is a 28 y.o. male who was seen today for physical therapy evaluation and treatment for ACL reconstruction and meniscal repair. This is his second surgery, the first one being in 2018. Currently he is ambulating with a single crutch. He was never given a brace to wear. He presents with pain with activity and walking. Pt has good flexion ROM but is missing about 10 degrees of extension and reports this is the hardest thing for him. Last MD visit was 08/05/23 and instructions were "no loaded flexion more than 90 degrees, weightbearing as tolerated. Okay for range of motion and strengthening. No open chain exercises. Stationary bike okay to use." He has been doing SLR as instructed by his surgeon but is unable to get full extension still. Patient will benefit from skilled PT to address his strength and gait impairments to be able to return to work and the gym.   OBJECTIVE IMPAIRMENTS: Abnormal gait, difficulty walking, decreased ROM, decreased strength, and pain.   ACTIVITY LIMITATIONS: squatting, stairs, transfers, and locomotion level  PARTICIPATION LIMITATIONS: community activity, occupation, yard work, and gym  REHAB POTENTIAL: Good  CLINICAL DECISION MAKING: Stable/uncomplicated  EVALUATION COMPLEXITY: Low   GOALS: Goals reviewed with patient? Yes  SHORT TERM GOALS: Target date: 09/27/23  Patient will be independent with initial HEP. Baseline: given 08/16/23 Goal status: INITIAL   2. Patient will demonstrate improved L knee AROM to >/= 0-120 deg to allow for normal  gait and stair mechanics. Baseline: 10-0-115 Goal status: INITIAL  3. Patient will complete TUG <12s without AD Baseline: 19.17s with crutch  Goal status: INITIAL   LONG TERM GOALS: Target date: 11/08/23  Patient will be independent with advanced/ongoing HEP to improve outcomes and carryover.  Baseline:  Goal status: INITIAL  2.  Patient will report at least 75% improvement in L knee pain and function to improve QOL. Baseline: only has pain after activity Goal status: INITIAL  3.  Patient will demonstrate improved L knee AROM to >/= 0-120 deg to allow for normal gait and stair mechanics. Baseline: 10-0-115 Goal status: INITIAL  4.  Patient will demonstrate improved functional LE strength as demonstrated by 5/5. Baseline: quad 4-, hamstring 3+ Goal status: INITIAL  5.  Patient will be able to ambulate 600' with LRAD and normal gait pattern without increased pain to access community.  Baseline: using single crutch Goal status: INITIAL  6. Patient will be able to ascend/descend stairs with reciprocal step pattern safely to access home and community.  Baseline: has not tried stairs yet, has 3 flights in his house. Currently staying on bottom floor Goal status: INITIAL  PLAN:  PT FREQUENCY: 2x/week  PT DURATION: 12 weeks  PLANNED INTERVENTIONS: 97110-Therapeutic exercises, 97530- Therapeutic activity, 97112- Neuromuscular re-education, 97535- Self Care, 16109- Manual therapy, [phone]- Gait training, [phone]- Vasopneumatic device, Patient/Family education, Balance training, Stair training, Taping, Dry Needling, Joint mobilization, Spinal mobilization, Cryotherapy, and Moist heat  PLAN FOR NEXT SESSION: gait training without crutch, closed chain exercises, work on getting [employer], PT 08/16/2023, 10:57 AM

## 2023-08-16 ENCOUNTER — Ambulatory Visit: Payer: Medicaid Other | Attending: Orthopedic Surgery

## 2023-08-16 DIAGNOSIS — M25662 Stiffness of left knee, not elsewhere classified: Secondary | ICD-10-CM | POA: Insufficient documentation

## 2023-08-16 DIAGNOSIS — M25562 Pain in left knee: Secondary | ICD-10-CM | POA: Diagnosis present

## 2023-08-16 DIAGNOSIS — R2689 Other abnormalities of gait and mobility: Secondary | ICD-10-CM | POA: Diagnosis present

## 2023-08-16 DIAGNOSIS — Z9889 Other specified postprocedural states: Secondary | ICD-10-CM | POA: Diagnosis present

## 2023-08-20 ENCOUNTER — Encounter: Payer: Self-pay | Admitting: Physical Therapy

## 2023-08-20 ENCOUNTER — Ambulatory Visit: Payer: Medicaid Other | Attending: Orthopedic Surgery | Admitting: Physical Therapy

## 2023-08-20 DIAGNOSIS — R2689 Other abnormalities of gait and mobility: Secondary | ICD-10-CM | POA: Insufficient documentation

## 2023-08-20 DIAGNOSIS — Z9889 Other specified postprocedural states: Secondary | ICD-10-CM | POA: Diagnosis present

## 2023-08-20 DIAGNOSIS — M25562 Pain in left knee: Secondary | ICD-10-CM | POA: Diagnosis present

## 2023-08-20 DIAGNOSIS — M25662 Stiffness of left knee, not elsewhere classified: Secondary | ICD-10-CM | POA: Diagnosis present

## 2023-08-20 NOTE — Therapy (Signed)
 OUTPATIENT PHYSICAL THERAPY LOWER EXTREMITY TREATMENT   Patient Name: Karl Flores MRN: [id number] DOB:20-Nov-1995, 28 y.o., male Today's Date: 08/20/2023  END OF SESSION:  PT End of Session - 08/20/23 1311     Visit Number 2    Date for PT Re-Evaluation 11/08/23    Authorization Type [place] Medicaid    PT Start Time 1308    PT Stop Time 1405    PT Time Calculation (min) 57 min    Activity Tolerance Patient tolerated treatment well    Behavior During Therapy [facility] for tasks assessed/performed             History reviewed. No pertinent past medical history. Past Surgical History:  Procedure Laterality Date   ANTERIOR CRUCIATE LIGAMENT REPAIR Left 07/04/2023   Procedure: LEFT KNEE REVISION ACL RECONSTRUCTION, BONE PATELLA TENDON BONE AUTOGRAFT;  Surgeon: Cammy Copa, MD;  Location: [facility] OR;  Service: Orthopedics;  Laterality: Left;   COLONOSCOPY     HARDWARE REMOVAL Left 07/04/2023   Procedure: BILATERAL MENISCAL REPAIR;  Surgeon: Cammy Copa, MD;  [facility] OR;  Service: Orthopedics;  Laterality: Left;   KNEE ARTHROSCOPY WITH ANTERIOR CRUCIATE LIGAMENT (ACL) REPAIR WITH HAMSTRING GRAFT Left 12/04/2016   Procedure: KNEE ARTHROSCOPY WITH ANTERIOR CRUCIATE LIGAMENT (ACL) REPAIR WITH HAMSTRING GRAFT WITH POSSIBLE POSTROLATERAL CORNER RECONSTRUCTION AND LATERAL MENISCAL REPAIR VERSUS RESECTION;  Surgeon: Cammy Copa, MD;  Location: [facility] OR;  Service: Orthopedics;  Laterality: Left;   KNEE ARTHROSCOPY WITH MENISCAL REPAIR Left 12/04/2016   Procedure: KNEE ARTHROSCOPY WITH MENISCAL REPAIR;  Surgeon: Cammy Copa, MD;  Location: [facility] OR;  Service: Orthopedics;  Laterality: Left;   WISDOM TOOTH EXTRACTION     Patient Active Problem List   Diagnosis Date Noted   Hemorrhoids 07/29/2023   Rupture of anterior cruciate ligament of right knee 07/18/2023   Acute medial meniscus tear of left knee 07/18/2023   Acute lateral meniscus tear of left knee 07/18/2023    PCP:  Herbie Drape  REFERRING PROVIDER: Cammy Copa  REFERRING DIAG:  [phone] (ICD-10-CM) - S/P ACL reconstruction    THERAPY DIAG:  S/P ACL reconstruction  Acute pain of left knee  Other abnormalities of gait and mobility  Stiffness of left knee, not elsewhere classified  Rationale for Evaluation and Treatment: Rehabilitation  ONSET DATE: 05/21/23- when I tore it, surgery was 07/04/23  SUBJECTIVE:   SUBJECTIVE STATEMENT: Doing okay, just feels stiff  PERTINENT HISTORY: Left anterior cruciate ligament revision reconstruction with bone patellar tendon bone autograft and medial/lateral meniscal repairs on 1/16/202   ACL reconstruction in 2018 w/hamstring graft and meniscal repair L    PAIN:  Are you having pain? No Only some pain after walking and it gets stiff   PRECAUTIONS: Knee  RED FLAGS: None   WEIGHT BEARING RESTRICTIONS:  PT left knee no loaded flexion beyond 90, WBAT, ROM and strengthening, no open chain, stationary bike.  FALLS:  Has patient fallen in last 6 months? Yes. 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  DIAGNOSTIC FINDINGS:  1. Complete tear of  the ACL graft. 2. Partial midsubstance PCL tear. 3. Complex far peripheral tear involving the posterior horn of the medial meniscus. 4. Small longitudinal tear involving the inferior articular surface of the posterior horn of the lateral meniscus. 5. Fibular collateral ligament sprain. No complete tear. 6. Proximal MCL sprain and significant acute/traumatic MCL and pes anserine bursitis. 7.  Bone contusion involving the posterior aspect of the lateral tibia anterior aspect of the medial tibia. 8. Large joint effusion. 9. Proximal popliteus muscle strain/partial tear.   COGNITION: Overall cognitive status: Within functional limits for tasks assessed     SENSATION: WFL   MUSCLE LENGTH: Hamstrings: tightness in LLE  POSTURE: No Significant postural limitations  PALPATION: Some swelling in L knee  LOWER EXTREMITY ROM:  Active ROM Right eval Left eval  Hip flexion    Hip extension    Hip abduction    Hip adduction    Hip internal rotation    Hip external rotation    Knee flexion  115  Knee extension  -10 with pain  Ankle dorsiflexion    Ankle plantarflexion    Ankle inversion    Ankle eversion     (Blank rows = not tested)  LOWER EXTREMITY MMT:  MMT Right eval Left eval  Hip flexion    Hip extension    Hip abduction    Hip adduction    Hip internal rotation    Hip external rotation    Knee flexion  3+ with pain  Knee extension  4-   Ankle dorsiflexion    Ankle plantarflexion    Ankle inversion    Ankle eversion     (Blank rows = not tested)  FUNCTIONAL TESTS:  5 times sit to stand: 15.37s Timed up and go (TUG): 19.17s  GAIT: Distance walked: in clinic distances  Assistive device utilized: Crutches 1 crutch  Level of assistance: Modified independence Comments: antalgic pattern, decrease stance time and knee flexion on LLE                                                                                                                                TREATMENT DATE:  08/20/23 Bike started partial revs was able to do fulls x 5 minutes Nustep level 4 x 5 minutes Standing ball behind knee TKE Slant board calf stretch Standing Blue Tband TKE LEg press 20# 2x10, then left leg no weight small ROM 4" step ups fwd and left side step up Vaso medium pressure  08/16/23- EVAL   PATIENT EDUCATION:  Education details: poc and HEP Person educated:  Patient Education method: Explanation Education comprehension: verbalized understanding  HOME EXERCISE PROGRAM: Access Code: [email or web address] URL: [email or web address] Date: 08/16/2023 Prepared by: Cassie Freer  Exercises - Supine Quad Set  - 1 x daily - 7 x weekly - 2 sets - 10 reps - Active Straight Leg Raise with Quad Set  - 1 x daily - 7 x weekly - 2  sets - 10 reps - Sit to Stand  - 1 x daily - 7 x weekly - 2 sets - 10 reps - Standing March with Counter Support  - 1 x daily - 7 x weekly - 2 sets - 10 reps - Standing Knee Flexion with Counter Support  - 1 x daily - 7 x weekly - 2 sets - 10 reps - Heel Raises with Counter Support  - 1 x daily - 7 x weekly - 2 sets - 10 reps  ASSESSMENT:  CLINICAL IMPRESSION: Patient is a 28 y.o. male who was seen today for physical therapy evaluation and treatment for ACL reconstruction and meniscal repair. This is his second surgery, the first one being in 2018. Currently he is ambulating with a single crutch. Patient has significant difficulty getting Quads to fire, the TKE with tband and the ball behind knee seemed to work well, he was able to do full revolutions on the bike.  Patient will benefit from skilled PT to address his strength and gait impairments to be able to return to work and the gym.   OBJECTIVE IMPAIRMENTS: Abnormal gait, difficulty walking, decreased ROM, decreased strength, and pain.   ACTIVITY LIMITATIONS: squatting, stairs, transfers, and locomotion level  PARTICIPATION LIMITATIONS: community activity, occupation, yard work, and gym  REHAB POTENTIAL: Good  CLINICAL DECISION MAKING: Stable/uncomplicated  EVALUATION COMPLEXITY: Low   GOALS: Goals reviewed with patient? Yes  SHORT TERM GOALS: Target date: 09/27/23  Patient will be independent with initial HEP. Baseline: given 08/16/23 Goal status: INITIAL   2. Patient will demonstrate improved L knee AROM to >/= 0-120 deg to allow for normal gait and stair  mechanics. Baseline: 10-0-115 Goal status: INITIAL  3. Patient will complete TUG <12s without AD Baseline: 19.17s with crutch  Goal status: INITIAL   LONG TERM GOALS: Target date: 11/08/23  Patient will be independent with advanced/ongoing HEP to improve outcomes and carryover.  Baseline:  Goal status: INITIAL  2.  Patient will report at least 75% improvement in L knee pain and function to improve QOL. Baseline: only has pain after activity Goal status: INITIAL  3.  Patient will demonstrate improved L knee AROM to >/= 0-120 deg to allow for normal gait and stair mechanics. Baseline: 10-0-115 Goal status: INITIAL  4.  Patient will demonstrate improved functional LE strength as demonstrated by 5/5. Baseline: quad 4-, hamstring 3+ Goal status: INITIAL  5.  Patient will be able to ambulate 600' with LRAD and normal gait pattern without increased pain to access community.  Baseline: using single crutch Goal status: INITIAL  6. Patient will be able to ascend/descend stairs with reciprocal step pattern safely to access home and community.  Baseline: has not tried stairs yet, has 3 flights in his house. Currently staying on bottom floor Goal status: INITIAL  PLAN:  PT FREQUENCY: 2x/week  PT DURATION: 12 weeks  PLANNED INTERVENTIONS: 97110-Therapeutic exercises, 97530- Therapeutic activity, 97112- Neuromuscular re-education, 97535- Self Care, 54098- Manual therapy, [phone]- Gait training, [phone]- Vasopneumatic device, Patient/Family education, Balance training, Stair training, Taping, Dry Needling, Joint mobilization, Spinal mobilization, Cryotherapy, and Moist heat  PLAN FOR NEXT SESSION: gait training without crutch, closed chain exercises, work on getting [employer], PT 08/20/2023, 1:43 PM

## 2023-08-27 ENCOUNTER — Ambulatory Visit: Payer: Medicaid Other | Admitting: Physical Therapy

## 2023-08-27 ENCOUNTER — Encounter: Payer: Self-pay | Admitting: Physical Therapy

## 2023-08-27 DIAGNOSIS — Z9889 Other specified postprocedural states: Secondary | ICD-10-CM | POA: Diagnosis not present

## 2023-08-27 DIAGNOSIS — M25562 Pain in left knee: Secondary | ICD-10-CM

## 2023-08-27 DIAGNOSIS — R2689 Other abnormalities of gait and mobility: Secondary | ICD-10-CM

## 2023-08-27 DIAGNOSIS — M25662 Stiffness of left knee, not elsewhere classified: Secondary | ICD-10-CM

## 2023-08-27 NOTE — Therapy (Signed)
 OUTPATIENT PHYSICAL THERAPY LOWER EXTREMITY TREATMENT   Patient Name: Karl Flores MRN: [id number] DOB:May 18, 1996, 28 y.o., male Today's Date: 08/27/2023  END OF SESSION:  PT End of Session - 08/27/23 1432     Visit Number 3    Date for PT Re-Evaluation 11/08/23    Authorization Type [place] [employer]    PT Start Time 1433    PT Stop Time 1512    PT Time Calculation (min) 39 min    Activity Tolerance Patient tolerated treatment well    Behavior During Therapy [facility] for tasks assessed/performed              History reviewed. No pertinent past medical history. Past Surgical History:  Procedure Laterality Date   ANTERIOR CRUCIATE LIGAMENT REPAIR Left 07/04/2023   Procedure: LEFT KNEE REVISION ACL RECONSTRUCTION, BONE PATELLA TENDON BONE AUTOGRAFT;  Surgeon: Cammy Copa, MD;  Location: [facility] OR;  Service: Orthopedics;  Laterality: Left;   COLONOSCOPY     HARDWARE REMOVAL Left 07/04/2023   Procedure: BILATERAL MENISCAL REPAIR;  Surgeon: Cammy Copa, MD;  Location: [facility] OR;  Service: Orthopedics;  Laterality: Left;   KNEE ARTHROSCOPY WITH ANTERIOR CRUCIATE LIGAMENT (ACL) REPAIR WITH HAMSTRING GRAFT Left 12/04/2016   Procedure: KNEE ARTHROSCOPY WITH ANTERIOR CRUCIATE LIGAMENT (ACL) REPAIR WITH HAMSTRING GRAFT WITH POSSIBLE POSTROLATERAL CORNER RECONSTRUCTION AND LATERAL MENISCAL REPAIR VERSUS RESECTION;  Surgeon: Cammy Copa, MD;  Location: [facility] OR;  Service: Orthopedics;  Laterality: Left;   KNEE ARTHROSCOPY WITH MENISCAL REPAIR Left 12/04/2016   Procedure: KNEE ARTHROSCOPY WITH MENISCAL REPAIR;  Surgeon: Cammy Copa, MD;  Location: [facility] OR;  Service: Orthopedics;  Laterality: Left;   WISDOM TOOTH EXTRACTION     Patient Active Problem List   Diagnosis Date Noted   Hemorrhoids 07/29/2023   Rupture of anterior cruciate ligament of right knee 07/18/2023   Acute medial meniscus tear of left knee 07/18/2023   Acute lateral meniscus tear of left knee 07/18/2023     PCP: Herbie Drape  REFERRING PROVIDER: Cammy Copa  REFERRING DIAG:  [phone] (ICD-10-CM) - S/P ACL reconstruction    THERAPY DIAG:  S/P ACL reconstruction  Acute pain of left knee  Other abnormalities of gait and mobility  Stiffness of left knee, not elsewhere classified  Rationale for Evaluation and Treatment: Rehabilitation  ONSET DATE: 05/21/23- when I tore it, surgery was 07/04/23  SUBJECTIVE:   SUBJECTIVE STATEMENT:  Knee is doing OK, took myself off the crutches. No pain, knee just feels stiff and still having a hard time getting it straight. Still having a hard time with steps    PERTINENT HISTORY: Left anterior cruciate ligament revision reconstruction with bone patellar tendon bone autograft and medial/lateral meniscal repairs on 1/16/202   ACL reconstruction in 2018 w/hamstring graft and meniscal repair L    PAIN:  Are you having pain? No 0/10 but starts to hurt after about 10 min   PRECAUTIONS: Knee  RED FLAGS: None   WEIGHT BEARING RESTRICTIONS:  PT left knee no loaded flexion beyond 90, WBAT, ROM and strengthening, no open chain, stationary bike.  FALLS:  Has patient fallen in last 6 months? Yes. 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  DIAGNOSTIC FINDINGS:  1. Complete tear of the ACL graft. 2. Partial midsubstance PCL tear. 3. Complex far peripheral tear involving the posterior horn of the medial meniscus. 4. Small longitudinal tear involving the inferior articular surface of the posterior horn of the lateral  meniscus. 5. Fibular collateral ligament sprain. No complete tear. 6. Proximal MCL sprain and significant acute/traumatic MCL and pes anserine bursitis. 7. Bone contusion involving the posterior aspect of the lateral tibia anterior aspect of the medial tibia. 8. Large joint effusion. 9. Proximal popliteus muscle strain/partial tear.   COGNITION: Overall cognitive status: Within functional limits for tasks assessed     SENSATION: WFL   MUSCLE LENGTH: Hamstrings: tightness in LLE  POSTURE: No Significant postural limitations  PALPATION: Some swelling in L knee  LOWER EXTREMITY ROM:  Active ROM Right eval Left eval  Hip flexion    Hip extension    Hip abduction    Hip adduction    Hip internal rotation    Hip external rotation    Knee flexion  115  Knee extension  -10 with pain  Ankle dorsiflexion    Ankle plantarflexion    Ankle inversion    Ankle eversion     (Blank rows = not tested)  LOWER EXTREMITY MMT:  MMT Right eval Left eval  Hip flexion    Hip extension    Hip abduction    Hip adduction    Hip internal rotation    Hip external rotation    Knee flexion  3+ with pain  Knee extension  4-   Ankle dorsiflexion    Ankle plantarflexion    Ankle inversion    Ankle eversion     (Blank rows = not tested)  FUNCTIONAL TESTS:  5 times sit to stand: 15.37s Timed up and go (TUG): 19.17s  GAIT: Distance walked: in clinic distances  Assistive device utilized: Crutches 1 crutch  Level of assistance: Modified independence Comments: antalgic pattern, decrease stance time and knee flexion on LLE                                                                                                                                TREATMENT DATE:   08/27/23   Scifit seat 14 full rotations x8 minutes for w/u and ROM Forward step ups 4 inch step 1x10 surgical LE Lateral step ups 4 inch step 1x10 surgical LE  Patella mobs all directions- mild limitation   Quad  sets 10x5 seconds L LE supine HS stretches on table 3x30 seconds  HS stretch on steps 3x15 seconds for knee extension TKEs over triangle wedge 2x10   Vasopneumatic L knee after other interventions x10 min in elevation    08/20/23 Bike started partial revs was able to do fulls x 5 minutes Nustep level 4 x 5  minutes Standing ball behind knee TKE Slant board calf stretch Standing Blue Tband TKE LEg press 20# 2x10, then left leg no weight small ROM 4" step ups fwd and left side step up Vaso medium pressure  08/16/23- EVAL   PATIENT EDUCATION:  Education details: poc and HEP Person educated: Patient Education method: Explanation Education comprehension: verbalized understanding  HOME EXERCISE PROGRAM: Access Code: [email or web address] URL: [email or web address] Date: 08/16/2023 Prepared by: Cassie Freer  Exercises - Supine Quad Set  - 1 x daily - 7 x weekly - 2 sets - 10 reps - Active Straight Leg Raise with Quad Set  - 1 x daily - 7 x weekly - 2 sets - 10 reps - Sit to Stand  - 1 x daily - 7 x weekly - 2 sets - 10 reps - Standing March with Counter Support  - 1 x daily - 7 x weekly - 2 sets - 10 reps - Standing Knee Flexion with Counter Support  - 1 x daily - 7 x weekly - 2 sets - 10 reps - Heel Raises with Counter Support  - 1 x daily - 7 x weekly - 2 sets - 10 reps  ASSESSMENT:  CLINICAL IMPRESSION:  Pt arrives today doing OK, he has weaned himself from the crutches but does still have a fairly antalgic gait pattern with limited quad activation. Kept working on knee extension and quad strengthening as able today, also incorporated additional CKC work today  as well. Will continue efforts. He really is not icing enough at home on his own (reports once every two days or so), encouraged daily icing 2-3x/day as much as able.      OBJECTIVE IMPAIRMENTS: Abnormal gait, difficulty walking, decreased ROM, decreased strength, and pain.   ACTIVITY LIMITATIONS: squatting,  stairs, transfers, and locomotion level  PARTICIPATION LIMITATIONS: community activity, occupation, yard work, and gym  REHAB POTENTIAL: Good  CLINICAL DECISION MAKING: Stable/uncomplicated  EVALUATION COMPLEXITY: Low   GOALS: Goals reviewed with patient? Yes  SHORT TERM GOALS: Target date: 09/27/23  Patient will be independent with initial HEP. Baseline: given 08/16/23 Goal status: INITIAL   2. Patient will demonstrate improved L knee AROM to >/= 0-120 deg to allow for normal gait and stair mechanics. Baseline: 10-0-115 Goal status: INITIAL  3. Patient will complete TUG <12s without AD Baseline: 19.17s with crutch  Goal status: INITIAL   LONG TERM GOALS: Target date: 11/08/23  Patient will be independent with advanced/ongoing HEP to improve outcomes and carryover.  Baseline:  Goal status: INITIAL  2.  Patient will report at least 75% improvement in L knee pain and function to improve QOL. Baseline: only has pain after activity Goal status: INITIAL  3.  Patient will demonstrate improved L knee AROM to >/= 0-120 deg to allow for normal gait and stair mechanics. Baseline: 10-0-115 Goal status: INITIAL  4.  Patient will demonstrate improved functional LE strength as demonstrated by 5/5. Baseline: quad 4-, hamstring 3+ Goal status: INITIAL  5.  Patient will be able to ambulate 600' with LRAD and normal gait pattern without increased pain to access community.  Baseline: using single crutch Goal status: INITIAL  6. Patient will be able to ascend/descend stairs with reciprocal step pattern safely to access home and community.  Baseline: has not tried stairs yet, has 3 flights in his house. Currently staying on bottom floor Goal status: INITIAL  PLAN:  PT FREQUENCY: 2x/week  PT DURATION: 12 weeks  PLANNED INTERVENTIONS: 97110-Therapeutic exercises, 97530- Therapeutic activity, 97112-  Neuromuscular re-education, [phone]- Self Care, 11914- Manual therapy, [phone]- Gait  training, [phone]- Vasopneumatic device, Patient/Family education, Balance training, Stair training, Taping, Dry Needling, Joint mobilization, Spinal mobilization, Cryotherapy, and Moist heat  PLAN FOR NEXT SESSION: gait training without crutch, closed chain exercises, work towards TKE, is he icing more at home?   Nedra Hai, PT, DPT 08/27/23 3:13 PM

## 2023-08-29 ENCOUNTER — Ambulatory Visit: Payer: Medicaid Other | Admitting: Physical Therapy

## 2023-08-29 ENCOUNTER — Encounter: Payer: Self-pay | Admitting: Physical Therapy

## 2023-08-29 DIAGNOSIS — R2689 Other abnormalities of gait and mobility: Secondary | ICD-10-CM

## 2023-08-29 DIAGNOSIS — M25662 Stiffness of left knee, not elsewhere classified: Secondary | ICD-10-CM

## 2023-08-29 DIAGNOSIS — Z9889 Other specified postprocedural states: Secondary | ICD-10-CM | POA: Diagnosis not present

## 2023-08-29 DIAGNOSIS — M25562 Pain in left knee: Secondary | ICD-10-CM

## 2023-08-29 NOTE — Therapy (Signed)
 OUTPATIENT PHYSICAL THERAPY LOWER EXTREMITY TREATMENT   Patient Name: Karl Flores MRN: [id number] DOB:10-25-1995, 28 y.o., male Today's Date: 08/29/2023  END OF SESSION:  PT End of Session - 08/29/23 1432     Visit Number 4    Date for PT Re-Evaluation 11/08/23    PT Start Time 1430    PT Stop Time 1515    PT Time Calculation (min) 45 min    Activity Tolerance Patient tolerated treatment well    Behavior During Therapy [facility] for tasks assessed/performed              History reviewed. No pertinent past medical history. Past Surgical History:  Procedure Laterality Date   ANTERIOR CRUCIATE LIGAMENT REPAIR Left 07/04/2023   Procedure: LEFT KNEE REVISION ACL RECONSTRUCTION, BONE PATELLA TENDON BONE AUTOGRAFT;  Surgeon: Cammy Copa, MD;  Location: [facility] OR;  Service: Orthopedics;  Laterality: Left;   COLONOSCOPY     HARDWARE REMOVAL Left 07/04/2023   Procedure: BILATERAL MENISCAL REPAIR;  Surgeon: Cammy Copa, MD;  Location: [facility] OR;  Service: Orthopedics;  Laterality: Left;   KNEE ARTHROSCOPY WITH ANTERIOR CRUCIATE LIGAMENT (ACL) REPAIR WITH HAMSTRING GRAFT Left 12/04/2016   Procedure: KNEE ARTHROSCOPY WITH ANTERIOR CRUCIATE LIGAMENT (ACL) REPAIR WITH HAMSTRING GRAFT WITH POSSIBLE POSTROLATERAL CORNER RECONSTRUCTION AND LATERAL MENISCAL REPAIR VERSUS RESECTION;  Surgeon: Cammy Copa, MD;  Location: [facility] OR;  Service: Orthopedics;  Laterality: Left;   KNEE ARTHROSCOPY WITH MENISCAL REPAIR Left 12/04/2016   Procedure: KNEE ARTHROSCOPY WITH MENISCAL REPAIR;  Surgeon: Cammy Copa, MD;  Location: [facility] OR;  Service: Orthopedics;  Laterality: Left;   WISDOM TOOTH EXTRACTION     Patient Active Problem List   Diagnosis Date Noted   Hemorrhoids 07/29/2023   Rupture of anterior cruciate ligament of right knee 07/18/2023   Acute medial meniscus tear of left knee 07/18/2023   Acute lateral meniscus tear of left knee 07/18/2023    PCP: Herbie Drape  REFERRING  PROVIDER: Cammy Copa  REFERRING DIAG:  [phone] (ICD-10-CM) - S/P ACL reconstruction    THERAPY DIAG:  S/P ACL reconstruction  Acute pain of left knee  Other abnormalities of gait and mobility  Stiffness of left knee, not elsewhere classified  Rationale for Evaluation and Treatment: Rehabilitation  ONSET DATE: 05/21/23- when I tore it, surgery was 07/04/23  SUBJECTIVE:   SUBJECTIVE STATEMENT:  "Feeling good, better than usual"   PERTINENT HISTORY: Left anterior cruciate ligament revision reconstruction with bone patellar tendon bone autograft and medial/lateral meniscal repairs on 1/16/202   ACL reconstruction in 2018 w/hamstring graft and meniscal repair L    PAIN:  Are you having pain? No 0/10 but starts to hurt after about 10 min   PRECAUTIONS: Knee  RED FLAGS: None   WEIGHT BEARING RESTRICTIONS:  PT left knee no loaded flexion beyond 90, WBAT, ROM and strengthening, no open chain, stationary bike.  FALLS:  Has patient fallen in last 6 months? Yes. 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  DIAGNOSTIC FINDINGS:  1. Complete tear of the ACL graft. 2.  Partial midsubstance PCL tear. 3. Complex far peripheral tear involving the posterior horn of the medial meniscus. 4. Small longitudinal tear involving the inferior articular surface of the posterior horn of the lateral meniscus. 5. Fibular collateral ligament sprain. No complete tear. 6. Proximal MCL sprain and significant acute/traumatic MCL and pes anserine bursitis. 7. Bone contusion  involving the posterior aspect of the lateral tibia anterior aspect of the medial tibia. 8. Large joint effusion. 9. Proximal popliteus muscle strain/partial tear.   COGNITION: Overall cognitive status: Within functional limits for tasks assessed     SENSATION: WFL   MUSCLE LENGTH: Hamstrings: tightness in LLE  POSTURE: No Significant postural limitations  PALPATION: Some swelling in L knee  LOWER EXTREMITY ROM:  Active ROM Right eval Left eval  Hip flexion    Hip extension    Hip abduction    Hip adduction    Hip internal rotation    Hip external rotation    Knee flexion  115  Knee extension  -10 with pain  Ankle dorsiflexion    Ankle plantarflexion    Ankle inversion    Ankle eversion     (Blank rows = not tested)  LOWER EXTREMITY MMT:  MMT Right eval Left eval  Hip flexion    Hip extension    Hip abduction    Hip adduction    Hip internal rotation    Hip external rotation    Knee flexion  3+ with pain  Knee extension  4-   Ankle dorsiflexion    Ankle plantarflexion    Ankle inversion    Ankle eversion     (Blank rows = not tested)  FUNCTIONAL TESTS:  5 times sit to stand: 15.37s Timed up and go (TUG): 19.17s  GAIT: Distance walked: in clinic distances  Assistive device utilized: Crutches 1 crutch  Level of assistance: Modified independence Comments: antalgic pattern, decrease stance time and knee flexion on LLE                                                                                                                                TREATMENT DATE:  08/29/23 NuStep L5 x 6 min LLE patellar mobs  Passive flex and ext w/ end range holds LLE Quad sets 2x10 LLE SLR x10  W/ ER x10  Sit to stands 2x10 HS curls 35lb 2x10, LLE 20 x10 Leg Ext 40lb 2x10, LLE 20lb 2x10 Heel raises 2x10 Step ups 6 in x 10 each forward and lateral LLE SAQ w/ manual resistance    08/27/23 Scifit seat 14 full rotations x8 minutes for w/u and ROM Forward step  ups 4 inch step 1x10 surgical LE Lateral step ups 4 inch step 1x10 surgical LE  Patella mobs all directions- mild limitation   Quad sets 10x5 seconds L LE supine HS stretches on table 3x30 seconds  HS stretch on steps 3x15 seconds for knee extension TKEs over triangle wedge 2x10  Vasopneumatic L knee after other interventions x10 min in elevation    08/20/23 Bike started partial revs was able to do fulls x 5 minutes Nustep level 4 x 5 minutes Standing ball behind knee TKE Slant board calf stretch Standing Blue Tband TKE LEg press 20# 2x10, then left leg no weight small ROM 4" step ups fwd and left side step up Vaso medium pressure  08/16/23- EVAL   PATIENT EDUCATION:  Education details: poc and HEP Person educated: Patient Education method: Explanation Education comprehension: verbalized understanding  HOME EXERCISE PROGRAM: Access Code: [email or web address] URL: [email or web address] Date: 08/16/2023 Prepared by: Cassie Freer  Exercises - Supine Quad Set  - 1 x daily - 7 x weekly - 2 sets - 10 reps - Active Straight Leg Raise with Quad Set  - 1 x daily - 7 x weekly - 2 sets - 10 reps - Sit to Stand  - 1 x daily - 7 x weekly - 2 sets - 10 reps - Standing March with Counter Support  - 1 x daily - 7 x weekly - 2 sets - 10 reps - Standing Knee Flexion with Counter Support  - 1 x daily - 7 x weekly - 2 sets - 10 reps - Heel Raises with Counter Support  - 1 x daily - 7 x weekly - 2 sets - 10 reps  ASSESSMENT:  CLINICAL IMPRESSION:  Pt arrives today doing OK, gait is improving without AD. Kept working on knee extension and quad strengthening as able today, also incorporated additional CKC interventions. Visual LLE shaking with all quad focused interventions. Compensation present with step up interventions.  Will continue to progress per protocol   OBJECTIVE IMPAIRMENTS: Abnormal gait, difficulty walking, decreased ROM, decreased strength, and pain.   ACTIVITY  LIMITATIONS: squatting, stairs, transfers, and locomotion level  PARTICIPATION LIMITATIONS: community activity, occupation, yard work, and gym  REHAB POTENTIAL: Good  CLINICAL DECISION MAKING: Stable/uncomplicated  EVALUATION COMPLEXITY: Low   GOALS: Goals reviewed with patient? Yes  SHORT TERM GOALS: Target date: 09/27/23  Patient will be independent with initial HEP. Baseline: given 08/16/23 Goal status: INITIAL   2. Patient will demonstrate improved L knee AROM to >/= 0-120 deg to allow for normal gait and stair mechanics. Baseline: 10-0-115 Goal status: INITIAL  3. Patient will complete TUG <12s without AD Baseline: 19.17s with crutch  Goal status: INITIAL   LONG TERM GOALS: Target date: 11/08/23  Patient will be independent with advanced/ongoing HEP to improve outcomes and carryover.  Baseline:  Goal status: INITIAL  2.  Patient will report at least 75% improvement in L knee pain and function to improve QOL. Baseline: only has pain after activity Goal status: INITIAL  3.  Patient will demonstrate improved L knee AROM to >/= 0-120 deg to allow for normal gait and stair mechanics. Baseline: 10-0-115 Goal status: INITIAL  4.  Patient will demonstrate improved functional LE strength as demonstrated by 5/5. Baseline: quad 4-, hamstring 3+ Goal status: INITIAL  5.  Patient will be able to ambulate 600' with LRAD and normal gait pattern without increased pain to access community.  Baseline: using single crutch Goal status: INITIAL  6. Patient will be able to ascend/descend stairs with reciprocal step pattern safely to access home and community.  Baseline: has not tried stairs yet, has 3 flights in his house. Currently staying on bottom floor Goal status: INITIAL  PLAN:  PT FREQUENCY: 2x/week  PT DURATION: 12 weeks  PLANNED INTERVENTIONS: 97110-Therapeutic exercises, 97530- Therapeutic activity,  09811- Neuromuscular re-education, [phone]- Self Care, 29562- Manual  therapy, [phone]- Gait training, [phone]- Vasopneumatic device, Patient/Family education, Balance training, Stair training, Taping, Dry Needling, Joint mobilization, Spinal mobilization, Cryotherapy, and Moist heat  PLAN FOR NEXT SESSION: gait training without crutch, closed chain exercises, work towards TKE, is he icing more at home?   Nedra Hai, PT, DPT 08/29/23 2:33 PM

## 2023-09-03 ENCOUNTER — Encounter: Payer: Self-pay | Admitting: Physical Therapy

## 2023-09-03 ENCOUNTER — Ambulatory Visit: Payer: Medicaid Other | Admitting: Physical Therapy

## 2023-09-03 DIAGNOSIS — M25662 Stiffness of left knee, not elsewhere classified: Secondary | ICD-10-CM

## 2023-09-03 DIAGNOSIS — R2689 Other abnormalities of gait and mobility: Secondary | ICD-10-CM

## 2023-09-03 DIAGNOSIS — M25562 Pain in left knee: Secondary | ICD-10-CM

## 2023-09-03 DIAGNOSIS — Z9889 Other specified postprocedural states: Secondary | ICD-10-CM | POA: Diagnosis not present

## 2023-09-03 NOTE — Therapy (Signed)
 OUTPATIENT PHYSICAL THERAPY LOWER EXTREMITY TREATMENT   Patient Name: Karl Flores MRN: [id number] DOB:03/13/1996, 28 y.o., male Today's Date: 09/03/2023  END OF SESSION:  PT End of Session - 09/03/23 1525     Visit Number 5    Date for PT Re-Evaluation 11/08/23    PT Start Time 1525    PT Stop Time 1600    PT Time Calculation (min) 35 min    Activity Tolerance Patient tolerated treatment well    Behavior During Therapy [facility] for tasks assessed/performed              History reviewed. No pertinent past medical history. Past Surgical History:  Procedure Laterality Date   ANTERIOR CRUCIATE LIGAMENT REPAIR Left 07/04/2023   Procedure: LEFT KNEE REVISION ACL RECONSTRUCTION, BONE PATELLA TENDON BONE AUTOGRAFT;  Surgeon: Cammy Copa, MD;  Location: [facility] OR;  Service: Orthopedics;  Laterality: Left;   COLONOSCOPY     HARDWARE REMOVAL Left 07/04/2023   Procedure: BILATERAL MENISCAL REPAIR;  Surgeon: Cammy Copa, MD;  Location: [facility] OR;  Service: Orthopedics;  Laterality: Left;   KNEE ARTHROSCOPY WITH ANTERIOR CRUCIATE LIGAMENT (ACL) REPAIR WITH HAMSTRING GRAFT Left 12/04/2016   Procedure: KNEE ARTHROSCOPY WITH ANTERIOR CRUCIATE LIGAMENT (ACL) REPAIR WITH HAMSTRING GRAFT WITH POSSIBLE POSTROLATERAL CORNER RECONSTRUCTION AND LATERAL MENISCAL REPAIR VERSUS RESECTION;  Surgeon: Cammy Copa, MD;  Location: [facility] OR;  Service: Orthopedics;  Laterality: Left;   KNEE ARTHROSCOPY WITH MENISCAL REPAIR Left 12/04/2016   Procedure: KNEE ARTHROSCOPY WITH MENISCAL REPAIR;  Surgeon: Cammy Copa, MD;  Location: [facility] OR;  Service: Orthopedics;  Laterality: Left;   WISDOM TOOTH EXTRACTION     Patient Active Problem List   Diagnosis Date Noted   Hemorrhoids 07/29/2023   Rupture of anterior cruciate ligament of right knee 07/18/2023   Acute medial meniscus tear of left knee 07/18/2023   Acute lateral meniscus tear of left knee 07/18/2023    PCP: Herbie Drape  REFERRING  PROVIDER: Cammy Copa  REFERRING DIAG:  [phone] (ICD-10-CM) - S/P ACL reconstruction    THERAPY DIAG:  S/P ACL reconstruction  Acute pain of left knee  Other abnormalities of gait and mobility  Stiffness of left knee, not elsewhere classified  Rationale for Evaluation and Treatment: Rehabilitation  ONSET DATE: 05/21/23- when I tore it, surgery was 07/04/23  SUBJECTIVE:   SUBJECTIVE STATEMENT:  "Feeling good, feeling real good"   PERTINENT HISTORY: Left anterior cruciate ligament revision reconstruction with bone patellar tendon bone autograft and medial/lateral meniscal repairs on 1/16/202   ACL reconstruction in 2018 w/hamstring graft and meniscal repair L    PAIN:  Are you having pain? No 0/10    PRECAUTIONS: Knee  RED FLAGS: None   WEIGHT BEARING RESTRICTIONS:  PT left knee no loaded flexion beyond 90, WBAT, ROM and strengthening, no open chain, stationary bike.  FALLS:  Has patient fallen in last 6 months? Yes. 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  DIAGNOSTIC FINDINGS:  1. Complete tear of the ACL graft. 2. Partial midsubstance PCL tear. 3. Complex far  peripheral tear involving the posterior horn of the medial meniscus. 4. Small longitudinal tear involving the inferior articular surface of the posterior horn of the lateral meniscus. 5. Fibular collateral ligament sprain. No complete tear. 6. Proximal MCL sprain and significant acute/traumatic MCL and pes anserine bursitis. 7. Bone contusion involving the posterior aspect of the  lateral tibia anterior aspect of the medial tibia. 8. Large joint effusion. 9. Proximal popliteus muscle strain/partial tear.   COGNITION: Overall cognitive status: Within functional limits for tasks assessed     SENSATION: WFL   MUSCLE LENGTH: Hamstrings: tightness in LLE  POSTURE: No Significant postural limitations  PALPATION: Some swelling in L knee  LOWER EXTREMITY ROM:  Active ROM Right eval Left eval  Hip flexion    Hip extension    Hip abduction    Hip adduction    Hip internal rotation    Hip external rotation    Knee flexion  115  Knee extension  -10 with pain  Ankle dorsiflexion    Ankle plantarflexion    Ankle inversion    Ankle eversion     (Blank rows = not tested)  LOWER EXTREMITY MMT:  MMT Right eval Left eval  Hip flexion    Hip extension    Hip abduction    Hip adduction    Hip internal rotation    Hip external rotation    Knee flexion  3+ with pain  Knee extension  4-   Ankle dorsiflexion    Ankle plantarflexion    Ankle inversion    Ankle eversion     (Blank rows = not tested)  FUNCTIONAL TESTS:  5 times sit to stand: 15.37s Timed up and go (TUG): 19.17s  GAIT: Distance walked: in clinic distances  Assistive device utilized: Crutches 1 crutch  Level of assistance: Modified independence Comments: antalgic pattern, decrease stance time and knee flexion on LLE                                                                                                                                TREATMENT DATE:  09/03/23 NuStep L5 x 5 min LLE quad sets x10 LLE SLR 3lb x10  W/ ER 2x10 LLE SAQ 3lb 2x10 LLE eccentrics 4in 2x10 Hs curls 35lb 2x15 Leg Press 50lb 2x10, LLE 30lb 2x10 Heel raises 2x15  08/29/23 NuStep L5 x 6 min LLE patellar mobs  Passive flex and ext w/ end range holds LLE Quad sets 2x10 LLE SLR x10  W/ ER x10  Sit to stands 2x10 HS curls 35lb 2x10, LLE 20 x10 Leg Press 40lb 2x10, LLE 20lb 2x10 Heel raises  2x10 Step ups 6 in x 10 each forward and lateral LLE SAQ w/ manual resistance    08/27/23 Scifit seat 14 full rotations x8 minutes for w/u and ROM Forward step ups 4 inch step 1x10 surgical LE Lateral step ups 4 inch step 1x10 surgical LE  Patella mobs all  directions- mild limitation   Quad sets 10x5 seconds L LE supine HS stretches on table 3x30 seconds  HS stretch on steps 3x15 seconds for knee extension TKEs over triangle wedge 2x10   Vasopneumatic L knee after other interventions x10 min in elevation    08/20/23 Bike started partial revs was able to do fulls x 5 minutes Nustep level 4 x 5 minutes Standing ball behind knee TKE Slant board calf stretch Standing Blue Tband TKE LEg press 20# 2x10, then left leg no weight small ROM 4" step ups fwd and left side step up Vaso medium pressure  08/16/23- EVAL   PATIENT EDUCATION:  Education details: poc and HEP Person educated: Patient Education method: Explanation Education comprehension: verbalized understanding  HOME EXERCISE PROGRAM: Access Code: [email or web address] URL: [email or web address] Date: 08/16/2023 Prepared by: Cassie Freer  Exercises - Supine Quad Set  - 1 x daily - 7 x weekly - 2 sets - 10 reps - Active Straight Leg Raise with Quad Set  - 1 x daily - 7 x weekly - 2 sets - 10 reps - Sit to Stand  - 1 x daily - 7 x weekly - 2 sets - 10 reps - Standing March with Counter Support  - 1 x daily - 7 x weekly - 2 sets - 10 reps - Standing Knee Flexion with Counter Support  - 1 x daily - 7 x weekly - 2 sets - 10 reps - Heel Raises with Counter Support  - 1 x daily - 7 x weekly - 2 sets - 10 reps  ASSESSMENT:  CLINICAL IMPRESSION:  Pt arrives 10 minutes late  doing OK, gait is improving without AD. Kept working on Dance movement psychotherapist as able today. Visual LLE shaking remains with all quad focused interventions. Weakness and discomfort with LLE eccentric step downs.   Will continue to progress per  protocol   OBJECTIVE IMPAIRMENTS: Abnormal gait, difficulty walking, decreased ROM, decreased strength, and pain.   ACTIVITY LIMITATIONS: squatting, stairs, transfers, and locomotion level  PARTICIPATION LIMITATIONS: community activity, occupation, yard work, and gym  REHAB POTENTIAL: Good  CLINICAL DECISION MAKING: Stable/uncomplicated  EVALUATION COMPLEXITY: Low   GOALS: Goals reviewed with patient? Yes  SHORT TERM GOALS: Target date: 09/27/23  Patient will be independent with initial HEP. Baseline: given 08/16/23 Goal status: INITIAL   2. Patient will demonstrate improved L knee AROM to >/= 0-120 deg to allow for normal gait and stair mechanics. Baseline: 10-0-115 Goal status: INITIAL  3. Patient will complete TUG <12s without AD Baseline: 19.17s with crutch  Goal status: INITIAL   LONG TERM GOALS: Target date: 11/08/23  Patient will be independent with advanced/ongoing HEP to improve outcomes and carryover.  Baseline:  Goal status: INITIAL  2.  Patient will report at least 75% improvement in L knee pain and function to improve QOL. Baseline: only has pain after activity Goal status: INITIAL  3.  Patient will demonstrate improved L knee AROM to >/= 0-120 deg to allow for normal gait and stair mechanics. Baseline: 10-0-115 Goal status: INITIAL  4.  Patient will demonstrate improved functional LE strength as demonstrated by 5/5. Baseline: quad 4-, hamstring 3+ Goal status: INITIAL  5.  Patient will be able to ambulate 600' with LRAD and normal gait pattern without increased pain to access community.  Baseline: using single crutch Goal status: INITIAL  6. Patient will be able to ascend/descend stairs with reciprocal step pattern safely to access home and community.  Baseline: has not tried stairs  yet, has 3 flights in his house. Currently staying on bottom floor Goal status: INITIAL  PLAN:  PT FREQUENCY: 2x/week  PT DURATION: 12 weeks  PLANNED  INTERVENTIONS: 97110-Therapeutic exercises, 97530- Therapeutic activity, 97112- Neuromuscular re-education, 97535- Self Care, 32951- Manual therapy, [phone]- Gait training, [phone]- Vasopneumatic device, Patient/Family education, Balance training, Stair training, Taping, Dry Needling, Joint mobilization, Spinal mobilization, Cryotherapy, and Moist heat  PLAN FOR NEXT SESSION: gait training without crutch, closed chain exercises, work towards [employer], is he icing more at home?   Nedra Hai, PT, DPT 09/03/23 3:26 PM

## 2023-09-05 ENCOUNTER — Encounter: Payer: Self-pay | Admitting: Physical Therapy

## 2023-09-05 ENCOUNTER — Ambulatory Visit: Payer: Medicaid Other | Admitting: Physical Therapy

## 2023-09-05 DIAGNOSIS — Z9889 Other specified postprocedural states: Secondary | ICD-10-CM

## 2023-09-05 DIAGNOSIS — M25562 Pain in left knee: Secondary | ICD-10-CM

## 2023-09-05 DIAGNOSIS — M25662 Stiffness of left knee, not elsewhere classified: Secondary | ICD-10-CM

## 2023-09-05 NOTE — Therapy (Signed)
 OUTPATIENT PHYSICAL THERAPY LOWER EXTREMITY TREATMENT   Patient Name: Karl Flores MRN: [id number] DOB:[birth year], 28 y.o., male Today's Date: 09/05/2023  END OF SESSION:  PT End of Session - 09/05/23 1520     Visit Number 6    Date for PT Re-Evaluation 11/08/23    PT Start Time 1520    PT Stop Time 1600    PT Time Calculation (min) 40 min    Activity Tolerance Patient tolerated treatment well    Behavior During Therapy [facility] for tasks assessed/performed              History reviewed. No pertinent past medical history. Past Surgical History:  Procedure Laterality Date   ANTERIOR CRUCIATE LIGAMENT REPAIR Left 07/04/2023   Procedure: LEFT KNEE REVISION ACL RECONSTRUCTION, BONE PATELLA TENDON BONE AUTOGRAFT;  Surgeon: Cammy Copa, MD;  Location: [facility] OR;  Service: Orthopedics;  Laterality: Left;   COLONOSCOPY     HARDWARE REMOVAL Left 07/04/2023   Procedure: BILATERAL MENISCAL REPAIR;  Surgeon: Cammy Copa, MD;  Location: [facility] OR;  Service: Orthopedics;  Laterality: Left;   KNEE ARTHROSCOPY WITH ANTERIOR CRUCIATE LIGAMENT (ACL) REPAIR WITH HAMSTRING GRAFT Left 12/04/2016   Procedure: KNEE ARTHROSCOPY WITH ANTERIOR CRUCIATE LIGAMENT (ACL) REPAIR WITH HAMSTRING GRAFT WITH POSSIBLE POSTROLATERAL CORNER RECONSTRUCTION AND LATERAL MENISCAL REPAIR VERSUS RESECTION;  Surgeon: Cammy Copa, MD;  Location: [facility] OR;  Service: Orthopedics;  Laterality: Left;   KNEE ARTHROSCOPY WITH MENISCAL REPAIR Left 12/04/2016   Procedure: KNEE ARTHROSCOPY WITH MENISCAL REPAIR;  Surgeon: Cammy Copa, MD;  Location: [facility] OR;  Service: Orthopedics;  Laterality: Left;   WISDOM TOOTH EXTRACTION     Patient Active Problem List   Diagnosis Date Noted   Hemorrhoids 07/29/2023   Rupture of anterior cruciate ligament of right knee 07/18/2023   Acute medial meniscus tear of left knee 07/18/2023   Acute lateral meniscus tear of left knee 07/18/2023    PCP: Herbie Drape  REFERRING  PROVIDER: Cammy Copa  REFERRING DIAG:  [phone] (ICD-10-CM) - S/P ACL reconstruction    THERAPY DIAG:  S/P ACL reconstruction  Acute pain of left knee  Stiffness of left knee, not elsewhere classified  Rationale for Evaluation and Treatment: Rehabilitation  ONSET DATE: 05/21/23- when I tore it, surgery was 07/04/23  SUBJECTIVE:   SUBJECTIVE STATEMENT:  "Feeling good"   PERTINENT HISTORY: Left anterior cruciate ligament revision reconstruction with bone patellar tendon bone autograft and medial/lateral meniscal repairs on 1/16/202   ACL reconstruction in 2018 w/hamstring graft and meniscal repair L    PAIN:  Are you having pain? No 0/10    PRECAUTIONS: Knee  RED FLAGS: None   WEIGHT BEARING RESTRICTIONS:  PT left knee no loaded flexion beyond 90, WBAT, ROM and strengthening, no open chain, stationary bike.  FALLS:  Has patient fallen in last 6 months? Yes. 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  DIAGNOSTIC FINDINGS:  1. Complete tear of the ACL graft. 2. Partial midsubstance PCL tear. 3. Complex far peripheral tear involving the posterior horn of the medial meniscus.  4. Small longitudinal tear involving the inferior articular surface of the posterior horn of the lateral meniscus. 5. Fibular collateral ligament sprain. No complete tear. 6. Proximal MCL sprain and significant acute/traumatic MCL and pes anserine bursitis. 7. Bone contusion involving the posterior aspect of the lateral tibia anterior aspect of the medial tibia. 8. Large joint  effusion. 9. Proximal popliteus muscle strain/partial tear.   COGNITION: Overall cognitive status: Within functional limits for tasks assessed     SENSATION: WFL   MUSCLE LENGTH: Hamstrings: tightness in LLE  POSTURE: No Significant postural limitations  PALPATION: Some swelling in L knee  LOWER EXTREMITY ROM:  Active ROM Right eval Left eval Left 09/05/23  Hip flexion     Hip extension     Hip abduction     Hip adduction     Hip internal rotation     Hip external rotation     Knee flexion  115 120  Knee extension  -10 with pain 2  Ankle dorsiflexion     Ankle plantarflexion     Ankle inversion     Ankle eversion      (Blank rows = not tested)  LOWER EXTREMITY MMT:  MMT Right eval Left eval  Hip flexion    Hip extension    Hip abduction    Hip adduction    Hip internal rotation    Hip external rotation    Knee flexion  3+ with pain  Knee extension  4-   Ankle dorsiflexion    Ankle plantarflexion    Ankle inversion    Ankle eversion     (Blank rows = not tested)  FUNCTIONAL TESTS:  5 times sit to stand: 15.37s Timed up and go (TUG): 19.17s  GAIT: Distance walked: in clinic distances  Assistive device utilized: Crutches 1 crutch  Level of assistance: Modified independence Comments: antalgic pattern, decrease stance time and knee flexion on LLE                                                                                                                                TREATMENT DATE:  09/05/23 Bike L 4 x 5 min GOALS  LLE SLR x10  3lb 2x10   ER 2x10 LLE SAQ 3lb 2x10 Leg press LLE 30lb 2x10 ~ 65deg, LLE heel raises 20lb 2x15  4in step downs 2x10 4in eccentric step down 2x10 Hs curls 20lb 2x10   09/03/23 NuStep L5 x 5 min LLE quad sets x10 LLE SLR 3lb x10  W/ ER 2x10 LLE SAQ 3lb 2x10 LLE eccentrics 4in 2x10 Hs curls 35lb 2x15 Leg Press 50lb 2x10, LLE 30lb 2x10 Heel raises 2x15  08/29/23 NuStep L5 x 6 min LLE patellar mobs  Passive  flex and ext w/ end range holds LLE Quad sets 2x10 LLE SLR x10  W/ ER x10  Sit to stands 2x10 HS curls 35lb 2x10, LLE 20 x10 Leg Press 40lb 2x10, LLE 20lb 2x10  Heel raises 2x10 Step ups 6 in x 10 each forward and lateral LLE SAQ w/ manual resistance    08/27/23 Scifit seat 14 full rotations x8 minutes for w/u and ROM Forward step ups 4 inch step 1x10 surgical LE Lateral step ups 4 inch step 1x10 surgical LE  Patella mobs all directions- mild limitation   Quad sets 10x5 seconds L LE supine HS stretches on table 3x30 seconds  HS stretch on steps 3x15 seconds for knee extension TKEs over triangle wedge 2x10   Vasopneumatic L knee after other interventions x10 min in elevation    08/20/23 Bike started partial revs was able to do fulls x 5 minutes Nustep level 4 x 5 minutes Standing ball behind knee TKE Slant board calf stretch Standing Blue Tband TKE LEg press 20# 2x10, then left leg no weight small ROM 4" step ups fwd and left side step up Vaso medium pressure  08/16/23- EVAL   PATIENT EDUCATION:  Education details: poc and HEP Person educated: Patient Education method: Explanation Education comprehension: verbalized understanding  HOME EXERCISE PROGRAM: Access Code: [email or web address] URL: [email or web address] Date: 08/16/2023 Prepared by: Cassie Freer  Exercises - Supine Quad Set  - 1 x daily - 7 x weekly - 2 sets - 10 reps - Active Straight Leg Raise with Quad Set  - 1 x daily - 7 x weekly - 2 sets - 10 reps - Sit to Stand  - 1 x daily - 7 x weekly - 2 sets - 10 reps - Standing March with Counter Support  - 1 x daily - 7 x weekly - 2 sets - 10 reps - Standing Knee Flexion with Counter Support  - 1 x daily - 7 x weekly - 2 sets - 10 reps - Heel Raises with Counter Support  - 1 x daily - 7 x weekly - 2 sets - 10 reps  ASSESSMENT:  CLINICAL IMPRESSION:  Pt arrives 5 minutes late  doing OK, gait is improving without AD. Kept working on Dance movement psychotherapist  as able today. Visual LLE shaking remains with all quad focused interventions. Weakness and discomfort with step down interventions. He has increased his L knee AROM.  Advised pt to complete strengthening interventions on HEP.   Will continue to progress per protocol   OBJECTIVE IMPAIRMENTS: Abnormal gait, difficulty walking, decreased ROM, decreased strength, and pain.   ACTIVITY LIMITATIONS: squatting, stairs, transfers, and locomotion level  PARTICIPATION LIMITATIONS: community activity, occupation, yard work, and gym  REHAB POTENTIAL: Good  CLINICAL DECISION MAKING: Stable/uncomplicated  EVALUATION COMPLEXITY: Low   GOALS: Goals reviewed with patient? Yes  SHORT TERM GOALS: Target date: 09/27/23  Patient will be independent with initial HEP. Baseline: given 08/16/23 Goal status: Can do it but hasn't been 09/05/23   2. Patient will demonstrate improved L knee AROM to >/= 0-120 deg to allow for normal gait and stair mechanics. Baseline: 10-0-115 Goal status: Partly met 09/05/23 2-120  3. Patient will complete TUG <12s without AD Baseline: 19.17s with crutch  Goal status: 09/05/23 Met 10.16   LONG TERM GOALS: Target date: 11/08/23  Patient will be independent with advanced/ongoing HEP to improve outcomes and carryover.  Baseline:  Goal status: INITIAL  2.  Patient will report at least 75% improvement in L knee pain and function to improve QOL. Baseline: only has pain after activity Goal status: INITIAL  3.  Patient will demonstrate improved L knee AROM to >/= 0-120 deg to allow for normal gait and stair mechanics. Baseline:  10-0-115 Goal status: ongoing 09/04/33  4.  Patient will demonstrate improved functional LE strength as demonstrated by 5/5. Baseline: quad 4-, hamstring 3+ Goal status: INITIAL  5.  Patient will be able to ambulate 600' with LRAD and normal gait pattern without increased pain to access community.  Baseline: using single crutch Goal status:  INITIAL  6. Patient will be able to ascend/descend stairs with reciprocal step pattern safely to access home and community.  Baseline: has not tried stairs yet, has 3 flights in his house. Currently staying on bottom floor Goal status: INITIAL  PLAN:  PT FREQUENCY: 2x/week  PT DURATION: 12 weeks  PLANNED INTERVENTIONS: 97110-Therapeutic exercises, 97530- Therapeutic activity, 97112- Neuromuscular re-education, 97535- Self Care, [id number]- Manual therapy, [phone]- Gait training, [phone]- Vasopneumatic device, Patient/Family education, Balance training, Stair training, Taping, Dry Needling, Joint mobilization, Spinal mobilization, Cryotherapy, and Moist heat  PLAN FOR NEXT SESSION: gait training without crutch, closed chain exercises, work towards [employer], is he icing more at home?   Nedra Hai, PT, DPT 09/05/23 3:21 PM

## 2023-09-09 ENCOUNTER — Ambulatory Visit: Payer: Medicaid Other

## 2023-09-09 ENCOUNTER — Ambulatory Visit: Payer: Self-pay | Admitting: Surgery

## 2023-09-11 ENCOUNTER — Ambulatory Visit: Payer: Medicaid Other | Admitting: Physical Therapy

## 2023-09-16 ENCOUNTER — Ambulatory Visit: Payer: Medicaid Other

## 2023-09-18 ENCOUNTER — Ambulatory Visit: Payer: Medicaid Other

## 2023-10-22 ENCOUNTER — Emergency Department (HOSPITAL_BASED_OUTPATIENT_CLINIC_OR_DEPARTMENT_OTHER): Admission: EM | Admit: 2023-10-22 | Discharge: 2023-10-22 | Discharge status: Against Medical Advice

## 2023-10-22 ENCOUNTER — Encounter (HOSPITAL_BASED_OUTPATIENT_CLINIC_OR_DEPARTMENT_OTHER): Payer: Self-pay

## 2023-10-22 ENCOUNTER — Other Ambulatory Visit: Payer: Self-pay

## 2023-10-22 DIAGNOSIS — K625 Hemorrhage of anus and rectum: Secondary | ICD-10-CM | POA: Diagnosis present

## 2023-10-22 DIAGNOSIS — K649 Unspecified hemorrhoids: Secondary | ICD-10-CM | POA: Diagnosis not present

## 2023-10-22 LAB — CBC WITH DIFFERENTIAL/PLATELET
Abs Immature Granulocytes: 0 10*3/uL (ref 0.00–0.07)
Basophils Absolute: 0 10*3/uL (ref 0.0–0.1)
Basophils Relative: 1 %
Eosinophils Absolute: 0.2 10*3/uL (ref 0.0–0.5)
Eosinophils Relative: 5 %
HCT: 36.7 % — ABNORMAL LOW (ref 39.0–52.0)
Hemoglobin: 12 g/dL — ABNORMAL LOW (ref 13.0–17.0)
Immature Granulocytes: 0 %
Lymphocytes Relative: 44 %
Lymphs Abs: 1.9 10*3/uL (ref 0.7–4.0)
MCH: 27.6 pg (ref 26.0–34.0)
MCHC: 32.7 g/dL (ref 30.0–36.0)
MCV: 84.4 fL (ref 80.0–100.0)
Monocytes Absolute: 0.3 10*3/uL (ref 0.1–1.0)
Monocytes Relative: 8 %
Neutro Abs: 1.8 10*3/uL (ref 1.7–7.7)
Neutrophils Relative %: 42 %
Platelets: 243 10*3/uL (ref 150–400)
RBC: 4.35 MIL/uL (ref 4.22–5.81)
RDW: 13.5 % (ref 11.5–15.5)
WBC: 4.3 10*3/uL (ref 4.0–10.5)
nRBC: 0 % (ref 0.0–0.2)

## 2023-10-22 LAB — COMPREHENSIVE METABOLIC PANEL WITH GFR
ALT: 16 U/L (ref 0–44)
AST: 19 U/L (ref 15–41)
Albumin: 4.4 g/dL (ref 3.5–5.0)
Alkaline Phosphatase: 51 U/L (ref 38–126)
Anion gap: 10 (ref 5–15)
BUN: 14 mg/dL (ref 6–20)
CO2: 26 mmol/L (ref 22–32)
Calcium: 9.3 mg/dL (ref 8.9–10.3)
Chloride: 103 mmol/L (ref 98–111)
Creatinine, Ser: 0.83 mg/dL (ref 0.61–1.24)
GFR, Estimated: >60 mL/min (ref >60)
Glucose, Bld: 82 mg/dL (ref 70–99)
Potassium: 4.1 mmol/L (ref 3.5–5.1)
Sodium: 140 mmol/L (ref 135–145)
Total Bilirubin: <0.2 mg/dL (ref 0.0–1.2)
Total Protein: 6.5 g/dL (ref 6.5–8.1)

## 2023-10-22 LAB — LIPASE, BLOOD: Lipase: 17 U/L (ref 11–51)

## 2023-10-22 NOTE — ED Provider Notes (Signed)
 [place] [facility] AT [facility] Provider Note   CSN: [id number] Arrival date & time: 10/22/23  1826     History  Chief Complaint  Patient presents with   Rectal Bleeding    Karl Flores is a 28 y.o. male.  28 year old male with past medical history of known internal hemorrhoids with surgery scheduled later this month presenting to the emergency department today with complaints of rectal pain and bleeding.  The patient states that he had a bowel movement this morning and had a lot more bleeding than normal.  He reports that there was a lot of blood in the toilet which was his primary concern.  He denies any nausea or vomiting.  He came to the emergency department that time for further evaluation.  He reports that he has had issues with external hemorrhoids in the past that required excision in the emergency department and was unsure if this was the case today which is why came to the ER for further evaluation.   Rectal Bleeding      Home Medications Prior to Admission medications   Medication Sig Start Date End Date Taking? Authorizing Provider  celecoxib  (CELEBREX ) 100 MG capsule Take 1 capsule (100 mg total) by mouth 2 (two) times daily. 07/04/23 07/03/24  Magnant, Justice Olp, PA-C  hydrocortisone  (ANUSOL -HC) 25 MG suppository Place 1 suppository (25 mg total) rectally 2 (two) times daily. 07/29/23   Graig Lawyer, MD      Allergies    Penicillins    Review of Systems   Review of Systems  Gastrointestinal:  Positive for hematochezia.  All other systems reviewed and are negative.   Physical Exam Updated Vital Signs BP 136/69 (BP Location: Right Arm)   Pulse 60   Temp (!) 97 F (36.1 C) (Temporal)   Resp 18   Ht 6' (1.829 m)   Wt 104.3 kg   SpO2 100%   BMI 31.19 kg/m  Physical Exam Vitals and nursing note reviewed.   Gen: NAD Eyes: PERRL, EOMI HEENT: no oropharyngeal swelling Neck: trachea midline Resp: clear to auscultation  bilaterally Card: RRR, no murmurs, rubs, or gallops Abd: nontender, nondistended Rectal: nonthrombosed hemorrhoids, no bleeding Extremities: no calf tenderness, no edema Vascular: 2+ radial pulses bilaterally, 2+ DP pulses bilaterally Skin: no rashes Psyc: acting appropriately   ED Results / Procedures / Treatments   Labs (all labs ordered are listed, but only abnormal results are displayed) Labs Reviewed  CBC WITH DIFFERENTIAL/PLATELET - Abnormal; Notable for the following components:      Result Value   Hemoglobin 12.0 (*)    HCT 36.7 (*)    All other components within normal limits  COMPREHENSIVE METABOLIC PANEL WITH GFR  LIPASE, BLOOD    EKG None  Radiology No results found.  Procedures Procedures    Medications Ordered in ED Medications - No data to display  ED Course/ Medical Decision Making/ A&P                                 Medical Decision Making 28 year old male with past medical history of internal and external hemorrhoids in the past presenting to the emergency department today with rectal bleeding.  Will obtain basic labs here to evaluate for anemia although suspicion is low.  The patient does not have any thrombosed hemorrhoids that require emergent treatment at this time.  Will provide topical medications if his workup is  negative.  The patient's hemoglobin has had a slight drop compared to his previous levels.  Vital signs remained stable.  Before I can go discuss the patient's results as he had eloped from the emergency department.  Amount and/or Complexity of Data Reviewed Labs: ordered.           Final Clinical Impression(s) / ED Diagnoses Final diagnoses:  Rectal bleeding  Hemorrhoids, unspecified hemorrhoid type    Rx / DC Orders ED Discharge Orders     None         Carin Charleston, MD 10/22/23 2205

## 2023-10-22 NOTE — ED Triage Notes (Signed)
 Pt is scheduled for hemorrhoid sx on 5/15 Today he state the pain has been unbearable and is losing "too much blood"

## 2023-10-22 NOTE — ED Notes (Signed)
 Pt left room and said he needed to leave. EDP made aware.

## 2023-10-31 ENCOUNTER — Emergency Department (HOSPITAL_BASED_OUTPATIENT_CLINIC_OR_DEPARTMENT_OTHER)
Admission: EM | Admit: 2023-10-31 | Discharge: 2023-11-01 | Disposition: A | Discharge status: Home / Self Care | Attending: Emergency Medicine | Admitting: Emergency Medicine

## 2023-10-31 ENCOUNTER — Other Ambulatory Visit: Payer: Self-pay

## 2023-10-31 DIAGNOSIS — R5082 Postprocedural fever: Secondary | ICD-10-CM | POA: Diagnosis not present

## 2023-10-31 DIAGNOSIS — R42 Dizziness and giddiness: Secondary | ICD-10-CM | POA: Diagnosis not present

## 2023-10-31 DIAGNOSIS — R791 Abnormal coagulation profile: Secondary | ICD-10-CM | POA: Diagnosis not present

## 2023-10-31 DIAGNOSIS — G8918 Other acute postprocedural pain: Secondary | ICD-10-CM

## 2023-10-31 DIAGNOSIS — R110 Nausea: Secondary | ICD-10-CM | POA: Diagnosis not present

## 2023-10-31 DIAGNOSIS — R55 Syncope and collapse: Secondary | ICD-10-CM

## 2023-10-31 DIAGNOSIS — R531 Weakness: Secondary | ICD-10-CM | POA: Diagnosis not present

## 2023-10-31 NOTE — ED Triage Notes (Signed)
 Pt. Brother states pt. Has had multiple syncopal episodes today after having internal hemorrhoid removal at [facility] surgical center . Also had fever of 101 at home anal pain Pt. Took oxycodone  2 hours ago

## 2023-11-01 ENCOUNTER — Encounter (HOSPITAL_COMMUNITY): Payer: Self-pay | Admitting: Emergency Medicine

## 2023-11-01 ENCOUNTER — Emergency Department (HOSPITAL_BASED_OUTPATIENT_CLINIC_OR_DEPARTMENT_OTHER)

## 2023-11-01 DIAGNOSIS — K6289 Other specified diseases of anus and rectum: Secondary | ICD-10-CM | POA: Diagnosis not present

## 2023-11-01 DIAGNOSIS — Z789 Other specified health status: Secondary | ICD-10-CM | POA: Diagnosis not present

## 2023-11-01 DIAGNOSIS — R55 Syncope and collapse: Secondary | ICD-10-CM | POA: Diagnosis not present

## 2023-11-01 DIAGNOSIS — R0689 Other abnormalities of breathing: Secondary | ICD-10-CM | POA: Diagnosis not present

## 2023-11-01 LAB — CBC WITH DIFFERENTIAL/PLATELET
Abs Immature Granulocytes: 0.06 10*3/uL (ref 0.00–0.07)
Basophils Absolute: 0 10*3/uL (ref 0.0–0.1)
Basophils Relative: 0 %
Eosinophils Absolute: 0 10*3/uL (ref 0.0–0.5)
Eosinophils Relative: 0 %
HCT: 35.6 % — ABNORMAL LOW (ref 39.0–52.0)
Hemoglobin: 11.9 g/dL — ABNORMAL LOW (ref 13.0–17.0)
Immature Granulocytes: 1 %
Lymphocytes Relative: 6 %
Lymphs Abs: 0.8 10*3/uL (ref 0.7–4.0)
MCH: 27.9 pg (ref 26.0–34.0)
MCHC: 33.4 g/dL (ref 30.0–36.0)
MCV: 83.4 fL (ref 80.0–100.0)
Monocytes Absolute: 0.9 10*3/uL (ref 0.1–1.0)
Monocytes Relative: 7 %
Neutro Abs: 11.5 10*3/uL — ABNORMAL HIGH (ref 1.7–7.7)
Neutrophils Relative %: 86 %
Platelets: 233 10*3/uL (ref 150–400)
RBC: 4.27 MIL/uL (ref 4.22–5.81)
RDW: 13.9 % (ref 11.5–15.5)
WBC: 13.3 10*3/uL — ABNORMAL HIGH (ref 4.0–10.5)
nRBC: 0 % (ref 0.0–0.2)

## 2023-11-01 LAB — URINALYSIS, ROUTINE W REFLEX MICROSCOPIC
Bilirubin Urine: NEGATIVE
Glucose, UA: NEGATIVE mg/dL
Hgb urine dipstick: NEGATIVE
Ketones, ur: 40 mg/dL — AB
Leukocytes,Ua: NEGATIVE
Nitrite: NEGATIVE
Protein, ur: NEGATIVE mg/dL
Specific Gravity, Urine: 1.015 (ref 1.005–1.030)
pH: 7 (ref 5.0–8.0)

## 2023-11-01 LAB — COMPREHENSIVE METABOLIC PANEL WITH GFR
ALT: 13 U/L (ref 0–44)
AST: 19 U/L (ref 15–41)
Albumin: 4.6 g/dL (ref 3.5–5.0)
Alkaline Phosphatase: 50 U/L (ref 38–126)
Anion gap: 15 (ref 5–15)
BUN: 12 mg/dL (ref 6–20)
CO2: 24 mmol/L (ref 22–32)
Calcium: 9.3 mg/dL (ref 8.9–10.3)
Chloride: 101 mmol/L (ref 98–111)
Creatinine, Ser: 0.93 mg/dL (ref 0.61–1.24)
GFR, Estimated: >60 mL/min (ref >60)
Glucose, Bld: 126 mg/dL — ABNORMAL HIGH (ref 70–99)
Potassium: 4.3 mmol/L (ref 3.5–5.1)
Sodium: 139 mmol/L (ref 135–145)
Total Bilirubin: 0.3 mg/dL (ref 0.0–1.2)
Total Protein: 6.7 g/dL (ref 6.5–8.1)

## 2023-11-01 LAB — PROTIME-INR
INR: 1.2 (ref 0.8–1.2)
Prothrombin Time: 15.7 s — ABNORMAL HIGH (ref 11.4–15.2)

## 2023-11-01 LAB — LACTIC ACID, PLASMA: Lactic Acid, Venous: 1.2 mmol/L (ref 0.5–1.9)

## 2023-11-01 MED ORDER — IOHEXOL 350 MG/ML SOLN
100.0000 mL | Freq: Once | INTRAVENOUS | Status: AC | PRN
  Administered 2023-11-01: 100 mL via INTRAVENOUS

## 2023-11-01 MED ORDER — CELECOXIB 100 MG PO CAPS
100.0000 mg | ORAL_CAPSULE | Freq: Two times a day (BID) | ORAL | 2 refills | Status: DC
Start: 2023-11-01 — End: 2024-03-13

## 2023-11-01 MED ORDER — DIAZEPAM 5 MG PO TABS: 5.0000 mg | ORAL_TABLET | Freq: Four times a day (QID) | ORAL | Status: DC | PRN

## 2023-11-01 MED ORDER — METRONIDAZOLE 500 MG/100ML IV SOLN
500.0000 mg | Freq: Once | INTRAVENOUS | Status: AC
  Administered 2023-11-01: 500 mg via INTRAVENOUS
  Filled 2023-11-01: qty 100

## 2023-11-01 MED ORDER — HYDROMORPHONE HCL 1 MG/ML IJ SOLN
1.0000 mg | Freq: Once | INTRAMUSCULAR | Status: AC
  Administered 2023-11-01: 1 mg via INTRAVENOUS
  Filled 2023-11-01: qty 1

## 2023-11-01 MED ORDER — DIAZEPAM 5 MG PO TABS: 5.0000 mg | ORAL_TABLET | Freq: Three times a day (TID) | ORAL | 1 refills | Status: DC | PRN

## 2023-11-01 MED ORDER — LIDOCAINE HCL URETHRAL/MUCOSAL 2 % EX GEL
1.0000 | Freq: Once | CUTANEOUS | Status: DC
Start: 2023-11-01 — End: 2023-11-01
  Filled 2023-11-01: qty 11

## 2023-11-01 MED ORDER — NAPROXEN 500 MG PO TABS
500.0000 mg | ORAL_TABLET | Freq: Two times a day (BID) | ORAL | Status: DC
  Administered 2023-11-01: 500 mg via ORAL
  Filled 2023-11-01: qty 1

## 2023-11-01 MED ORDER — SODIUM CHLORIDE 0.9 % IV BOLUS
1000.0000 mL | Freq: Once | INTRAVENOUS | Status: AC
  Administered 2023-11-01: 1000 mL via INTRAVENOUS

## 2023-11-01 MED ORDER — SODIUM CHLORIDE 0.9 % IV SOLN
2.0000 g | Freq: Once | INTRAVENOUS | Status: AC
  Administered 2023-11-01: 2 g via INTRAVENOUS
  Filled 2023-11-01: qty 12.5

## 2023-11-01 MED ORDER — ONDANSETRON HCL 4 MG/2ML IJ SOLN
4.0000 mg | Freq: Once | INTRAMUSCULAR | Status: AC
  Administered 2023-11-01: 4 mg via INTRAVENOUS
  Filled 2023-11-01: qty 2

## 2023-11-01 MED ORDER — OXYCODONE HCL 5 MG PO TABS
10.0000 mg | ORAL_TABLET | Freq: Once | ORAL | Status: AC
  Administered 2023-11-01: 10 mg via ORAL
  Filled 2023-11-01: qty 2

## 2023-11-01 MED ORDER — OXYCODONE-ACETAMINOPHEN 10-325 MG PO TABS: 1.0000 | ORAL_TABLET | Freq: Four times a day (QID) | ORAL | 0 refills | Status: DC | PRN

## 2023-11-01 MED ORDER — OXYCODONE HCL 5 MG PO TABS: 5.0000 mg | ORAL_TABLET | ORAL | Status: DC | PRN

## 2023-11-01 NOTE — Discharge Instructions (Signed)
 ##############################################  ANORECTAL SURGERY:  POST OPERATIVE INSTRUCTIONS  ######################################################################  EAT Start with a pureed / full liquid diet After 24 hours, gradually transition to a high fiber diet.    CONTROL PAIN Control pain so you can tolerate bowel movements,  walk, sleep, tolerate sneezing/coughing, and go up/down stairs.   HAVE A BOWEL MOVEMENT DAILY Keep your bowels regular to avoid problems.   Taking a fiber supplement 2-4 doses every day to keep bowels soft.   HAVE A BM BY THE SECOND POSTOPERATIVE DAY Use an antidairrheal to slow down diarrhea.   Call if not better after 2 tries  WALK Walk an hour a day.  Control your pain to do that.   CALL IF YOU HAVE PROBLEMS/CONCERNS Call if you are still struggling despite following these instructions. Call if you have concerns not answered by these instructions  ######################################################################    Take your usually prescribed home medications unless otherwise directed. Blood thinners:  You can restart any strong blood thinners after the second postoperative day  for example: COUMADIN (warfarin), XERELTO (rivaroxaban), ELIQUIS (apixaban), PLAVIX (clopidigrel), BRILINTA (ticagrelor), EFFIENT (prasugrel), PRADAXA (dabigatran), etc  Continue aspirin before & after surgery..     Some oozing/bleeding the first 1-2 weeks is common but should taper down & be small volume.    If you are passing many large clots or having uncontrolling bleeding, call your surgeon  DIET: Follow a light bland diet & liquids the first 24 hours after arrival home, such as soup, liquids, starches, etc.  Be sure to drink plenty of fluids.  Quickly advance to a usual solid diet within a few days.  Avoid fast food or heavy meals as your are more likely to get nauseated or have irregular bowels.  A low-fat, high-fiber diet for the rest of your life  is ideal.  Control pain to avoid problems with urinating or defecating:  Pain is best controlled by a usual combination of many methods TOGETHER: Warm baths/soaks or Ice packs Over the counter pain medication Prescription pain medications Topical creams  A.  Warm water baths or ice packs (30-60 minutes up to 8 times a day, especially after bowel meovements) will help.  Using ice for the first few days can help decrease swelling and bruising,  Use heat such as warm towels, sitz baths, warm baths, warm showers, etc to help relax tight/sore spots and speed recovery.   Some people prefer to use ice alone, heat alone, alternating between ice & heat.  Experiment to what works for you.    It is helpful to take an over-the-counter pain medication continuously for the first few weeks.  This helps people need to use less narcotics Choose one of the following that works best for you: Naproxen (Aleve, etc)  Two 220mg  tabs twice a day Ibuprofen (Advil, etc) Three 200mg  tabs four times a day (every meal & bedtime) Acetaminophen (Tylenol, etc) 500-650mg  four times a day (every meal & bedtime)  A  prescription for pain medication (such as oxycodone, hydrocodone, etc) should be given to you upon discharge.  Take your pain medication as prescribed.  If you are having problems/concerns with the prescription medicine (does not control pain, nausea, vomiting, rash, itching, etc), please call us [phone] to see if we need to switch you to a different pain medicine that will work better for you and/or control your side effect better. Most people will need 1-2 refills to get through the first couple of weeks when pain is the most  intense.  If you need a refill on your pain medication, please contact your pharmacy.  They will contact our office to request authorization. Prescriptions will not be filled after 5 pm or on week-ends.  If can take up to 48 hours for it to be filled & ready so avoid waiting until you  are down to thel ast pill.  A numbing topical cream (Dibucaine) may be given to you.  Many people find relief with topical creams.  Some people find it burns too much.  Experiment.  If it helps, use it.  If it burns, stop using it.  You also may receive a prescription for diazepam, a muscle relaxant to help you to be able to urinate at first easily.  It is safe to take a few doses with the other medications as long as you are not planning to drive or do anything intense.  Hopefully this can minimize the chance of needing a Foley catheter into your bladder     Have a bowel movement every day  Until you have a bowel movement after surgery, he will struggle with urinating and controlling her pain.  Take a fiber supplement (such as Metamucil, Citrucel, FiberCon, MiraLax, etc) 2-4 doses a day to help prevent constipation from occurring.     If you have not had a bowel movement the second postoperative day:  -switch to drinking liquids only -take MiraLAX double dose every 2 hours x 3 or until you have a BM -If that does not work x 3 doses, increase to 4 doses every 2 hours (Like a small bowel prep) until you have a bowel movement. -Avoid enemas or suppositories (can harm sutures/repair) -Once you start having bowel movements, adjust your fiber supplement or Miralax dosing back down until you are having 1-2 soft well formed BMs a day.  (Start at 2 doses a day & adjust up or down to avoid diarrhea or recurrent constipation)   Watch out for diarrhea.  If you have many loose bowel movements, simplify your diet to bland foods & liquids for a few days.  Stop any stool softeners and decrease your fiber supplement.  Switching to mild anti-diarrheal medications (Kayopectate, Pepto Bismol) can help.  Can try an imodium/loperamide dose.  If this worsens or does not improve, please call us.  Wound Care   a. You have some fluffed cotton on top of the anus to help catch drainage and bleeding.  THERE IS NO  PACKING INSIDE THE RECTUM -Let the cotton fall off with the first bowel movement or shower.  It is okay to reinforce or replace as needed.  Bleeding is common at first and gradually slows down after a few weeks   b. Place soft cotton balls on the anus/wounds and use an absorbent pad in your underwear as needed to catch any drainage and help keep the area.  Try to use cotton balls or pads (regular gauze or toilet paper will stick and pull, causing pain.  Cotton will come off more easily).  OK to try dry powders such as cornstarch or baby powders   c. Keep the area clean and dry.  Bathe / shower every day.  Keep the area clean by showering / bathing over the incision / wound.   It is okay to soak an open wound to help wash it.  Consider using a squeeze bottle filled with warm water to gently wash the anal area.  Wet wipes or showers / gentle washing after bowel movements  is often less traumatic than regular toilet paper.           D.  Use a Sitz Bath 4-8 times a day for relief  A sitz bath is a warm water bath taken in the sitting position that covers only the hips and buttocks. It may be used for either healing or hygiene purposes. Sitz baths are also used to relieve pain, itching, or muscle spasms.  Gently cleaned the area and the heat will help lower spasm and offer better pain control.    Fill the bathtub half full with warm water. Sit in the water and open the drain a little. Turn on the warm water to keep the tub half full. Keep the water running constantly. Soak in the water for 15 to 20 minutes. After the sitz bath, pat the affected area dry first.   d. You will often notice bleeding, especially with bowel movements.  This should slow down by the end of the first week of surgery.  It can take over a month to more fully stop.  Sitting on an ice pack can help.   e. Expect some drainage.  You often will have some blood or yellow drainage with open wounds.  Sometimes she will get a little leaking  of liquid stool until the incision/wounds have fully close down.  This should slow down by the end of the first week of surgery, but you will have occasional bleeding or drainage up to a few months after surgery until all incisions & wounds have closed.  Wear an absorbent pad or soft cotton gauze in your underwear until the drainage stops.  ACTIVITIES as tolerated:    You may resume regular (light) daily activities beginning the next day--such as daily self-care, walking, climbing stairs--gradually increasing activities as tolerated.  If you can walk 30 minutes without difficulty, it is safe to try more intense activity such as jogging, treadmill, bicycling, low-impact aerobics, swimming, etc. Save the most intensive and strenuous activity for last such as sit-ups, heavy lifting, contact sports, etc  Refrain from any heavy lifting or straining until you are off narcotics for pain control.   DO NOT PUSH THROUGH PAIN.  Let pain be your guide: If it hurts to do something, don't do it.  Pain is your body warning you to avoid that activity for another week until the pain goes down. You may drive when you are no longer taking prescription pain medication, you can comfortably sit for long periods of time, and you can safely maneuver your car and apply brakes. You may have sexual intercourse when it is comfortable.   6.  FOLLOW UP in our office Please call [facility] at [phone] to set up an appointment to see your surgeon in the office for a follow-up appointment approximately 3 weeks after your surgery. If tissue is sent for pathology, it usually takes a week for pathology results to come back.  We will make an effort to call you with results as soon as we get them.  If you have not heard back in a week and wish to know the results before your postop visit, please call our office and we will see if we can give feedback on the results Make sure that you call for this appointment the day you arrive home to  ensure a convenient appointment time.  7. IF YOU HAVE DISABILITY OR FAMILY LEAVE FORMS, BRING THEM TO THE OFFICE FOR PROCESSING.  DO NOT GIVE THEM TO YOUR  DOCTOR.        WHEN TO CALL us [phone]: Poor pain control Reactions / problems with new medications (rash/itching, nausea, etc)  Fever over 101.5 F (38.5 C) Inability to urinate Nausea and/or vomiting Worsening swelling or bruising Continued bleeding from incision. Increased pain, redness, or drainage from the incision  The clinic staff is available to answer your questions during regular business hours (8:30am-5pm).  Please don't hesitate to call and ask to speak to one of our nurses for clinical concerns.   A surgeon from [facility] is always on call at the hospitals   If you have a medical emergency, go to the nearest emergency room or call 911.    [facility] [address], [place], [place]  [address] ? MAIN: [phone] ? TOLL FREE: [phone] ? FAX [phone] [email or web address]  #####################################################

## 2023-11-01 NOTE — ED Notes (Addendum)
 Pt expressed concern that foley is not draining.  325 output noted, will monitor for additional output.

## 2023-11-01 NOTE — ED Notes (Signed)
 ED Provider at bedside.

## 2023-11-01 NOTE — Progress Notes (Signed)
 Karl Flores  February 28, 1996 [id number]  CARE TEAM:  PCP: Graig Lawyer, MD  Outpatient Care Team: Patient Care Team: Graig Lawyer, MD as PCP - General (Family Medicine) Elois Hair, MD as Consulting Physician (Gastroenterology) Candyce Champagne, MD as Consulting Physician (General Surgery) Rozelle Corning Maricela Shoe, MD as Consulting Physician (Orthopedic Surgery)  Inpatient Treatment Team: Treatment Team:  Albertus Hughs, Reeda Canner, MD Watt Hackney, Paramedic Wendelyn Halter, NT Janalee Mcmurray, PA-C Michela Aguas, RN   This patient is a 28 y.o.male who presents today for surgical evaluation at the request of [facility].   Chief complaint / Reason for evaluation: Postop  Young male status post hemorrhoid ligation pexy hemorrhoidectomy at surgical center [facility] yesterday.  Difficulty urinating.  Foley catheter placed and feels much better overall.  No other concerns.  No uncontrolled bleeding or discharge.  Somewhat anxious but consolable.  I think it is safe for him to go home.  Follow-up in our office Monday morning for Foley removal and voiding trial.  I sent pain medications and confirmed the correct pharmacy in [place] on [address].  Hopefully that will help him get his medications and avoid future problems.    Discussed and updated ER team.  They agree with discharge.  Meds, instructions given.    I reviewed nursing notes, ED provider notes, last 24 h vitals and pain scores, last 48 h intake and output, last 24 h labs and trends, and last 24 h imaging results. I have reviewed this patient's available data, including medical history, events of note, test results, etc as part of my evaluation.  A significant portion of that time was spent in counseling.  Care during the described time interval was provided by me.  This care required straight-forward level of medical decision making.  11/01/2023  Eddye Goodie, MD, FACS, MASCRS Esophageal,  Gastrointestinal & Colorectal Surgery Robotic and Minimally Invasive Surgery  [facility] [address] [place], [place] [address] [phone] Fax [phone] Main  CONTACT INFORMATION: Weekday (9AM-5PM): Call [facility] main office at [phone] Weeknight (5PM-9AM) or Weekend/Holiday: Check EPIC "Web Links" tab & use "AMION" (password " [email or web address]") for General Surgery [facility] coverage  Please, DO NOT use SecureChat  (it is not reliable communication to reach operating surgeons & will lead to a delay in care).   Epic staff messaging available for outptient concerns needing 1-2 business day response.      11/01/2023      History reviewed. No pertinent past medical history.  Past Surgical History:  Procedure Laterality Date   ANTERIOR CRUCIATE LIGAMENT REPAIR Left 07/04/2023   Procedure: LEFT KNEE REVISION ACL RECONSTRUCTION, BONE PATELLA TENDON BONE AUTOGRAFT;  Surgeon: Jasmine Mesi, MD;  Location: [facility] OR;  Service: Orthopedics;  Laterality: Left;   COLONOSCOPY     HARDWARE REMOVAL Left 07/04/2023   Procedure: BILATERAL MENISCAL REPAIR;  Surgeon: Jasmine Mesi, MD;  Location: [facility] OR;  Service: Orthopedics;  Laterality: Left;   KNEE ARTHROSCOPY WITH ANTERIOR CRUCIATE LIGAMENT (ACL) REPAIR WITH HAMSTRING GRAFT Left 12/04/2016   Procedure: KNEE ARTHROSCOPY WITH ANTERIOR CRUCIATE LIGAMENT (ACL) REPAIR WITH HAMSTRING GRAFT WITH POSSIBLE POSTROLATERAL CORNER RECONSTRUCTION AND LATERAL MENISCAL REPAIR VERSUS RESECTION;  Surgeon: Jasmine Mesi, MD;  Location: [facility] OR;  Service: Orthopedics;  Laterality: Left;   KNEE ARTHROSCOPY WITH MENISCAL REPAIR Left 12/04/2016   Procedure: KNEE ARTHROSCOPY WITH MENISCAL REPAIR;  Surgeon: Rozelle Corning,  Maricela Shoe, MD;  Location: [facility] OR;  Service: Orthopedics;  Laterality: Left;   WISDOM TOOTH EXTRACTION      Social History   Socioeconomic History   Marital status: Single    Spouse  name: Not on file   Number of children: 0   Years of education: Not on file   Highest education level: Not on file  Occupational History   Occupation: Chief Executive Officer and Sales    Comment[place]  Tobacco Use   Smoking status: Former    Types: Cigarettes    Start date: 09/2023    Quit date: 2023    Years since quitting: 2.3   Smokeless tobacco: Never   Tobacco comments:    hookah once a week; smokes 1 cigarette a day  Vaping Use   Vaping status: Never Used  Substance and Sexual Activity   Alcohol use: Never   Drug use: No   Sexual activity: Never    Birth control/protection: None  Other Topics Concern   Not on file  Social History Narrative   Not on file   Social Drivers of Health   Financial Resource Strain: Not on file  Food Insecurity: Not on file  Transportation Needs: Not on file  Physical Activity: Not on file  Stress: Not on file  Social Connections: Not on file  Intimate Partner Violence: Not on file    Family History  Problem Relation Age of Onset   Diabetes Father    Hyperlipidemia Father    Hypertension Father    Heart disease Father    Ulcerative colitis Brother    Stroke Paternal Grandmother    Diabetes Paternal Grandfather    Heart disease Paternal Grandfather    Colon cancer Neg Hx    Esophageal cancer Neg Hx    Stomach cancer Neg Hx    Rectal cancer Neg Hx     Current Facility-Administered Medications  Medication Dose Route Frequency Provider Last Rate Last Admin   diazepam (VALIUM) tablet 5 mg  5 mg Oral Q6H PRN Candyce Champagne, MD       lidocaine  (XYLOCAINE ) 2 % jelly 1 Application  1 Application Urethral Once Rancour, Mara [place]in[place], MD       naproxen  (NAPROSYN ) tablet 500 mg  500 mg Oral BID WC Candyce Champagne, MD   500 mg at 11/01/23 7253   oxyCODONE  (Oxy IR/ROXICODONE ) immediate release tablet 5-15 mg  5-15 mg Oral Q4H PRN Candyce Champagne, MD       Current Outpatient Medications  Medication Sig Dispense Refill   diazepam (VALIUM) 5 MG tablet  Take 1 tablet (5 mg total) by mouth every 8 (eight) hours as needed for muscle spasms (difficulty urinating). 6 tablet 1   oxyCODONE -acetaminophen  (PERCOCET) 10-325 MG tablet Take 1 tablet by mouth every 6 (six) hours as needed for pain. 30 tablet 0   celecoxib  (CELEBREX ) 100 MG capsule Take 1 capsule (100 mg total) by mouth 2 (two) times daily. 20 capsule 2   hydrocortisone  (ANUSOL -HC) 25 MG suppository Place 1 suppository (25 mg total) rectally 2 (two) times daily. 12 suppository 0     Allergies  Allergen Reactions   Penicillins Other (See Comments)    Has patient had a PCN reaction causing immediate rash, facial/tongue/throat swelling, SOB or lightheadedness with hypotension: Unknown Has patient had a PCN reaction causing severe rash involving mucus membranes or skin necrosis: Unknown Has patient had a PCN reaction that required hospitalization: Unknown Has patient had a PCN reaction occurring within the  last 10 years: No Unknown Childhood reaction  If all of the above answers are "NO", then may proceed with Cephalosporin use.       BP (!) 118/55 (BP Location: Left Arm)   Pulse 77   Temp 97.9 F (36.6 C) (Oral)   Resp 18   Ht 6' (1.829 m)   Wt 104.3 kg   SpO2 100%   BMI 31.19 kg/m     Results:   Labs: Results for orders placed or performed during the hospital encounter of 10/31/23 (from the past 48 hours)  CBC with Differential     Status: Abnormal   Collection Time: 11/01/23 12:14 AM  Result Value Ref Range   WBC 13.3 (H) 4.0 - 10.5 K/uL   RBC 4.27 4.22 - 5.81 MIL/uL   Hemoglobin 11.9 (L) 13.0 - 17.0 g/dL   HCT 16.1 (L) 09.6 - 04.5 %   MCV 83.4 80.0 - 100.0 fL   MCH 27.9 26.0 - 34.0 pg   MCHC 33.4 30.0 - 36.0 g/dL   RDW 40.9 81.1 - 91.4 %   Platelets 233 150 - 400 K/uL   nRBC 0.0 0.0 - 0.2 %   Neutrophils Relative % 86 %   Neutro Abs 11.5 (H) 1.7 - 7.7 K/uL   Lymphocytes Relative 6 %   Lymphs Abs 0.8 0.7 - 4.0 K/uL   Monocytes Relative 7 %   Monocytes  Absolute 0.9 0.1 - 1.0 K/uL   Eosinophils Relative 0 %   Eosinophils Absolute 0.0 0.0 - 0.5 K/uL   Basophils Relative 0 %   Basophils Absolute 0.0 0.0 - 0.1 K/uL   Immature Granulocytes 1 %   Abs Immature Granulocytes 0.06 0.00 - 0.07 K/uL    Comment: Performed at [facility], [address] [place], [place] [address]  Comprehensive metabolic panel     Status: Abnormal   Collection Time: 11/01/23 12:14 AM  Result Value Ref Range   Sodium 139 135 - 145 mmol/L   Potassium 4.3 3.5 - 5.1 mmol/L    Comment: HEMOLYSIS AT THIS LEVEL MAY AFFECT RESULT   Chloride 101 98 - 111 mmol/L   CO2 24 22 - 32 mmol/L   Glucose, Bld 126 (H) 70 - 99 mg/dL    Comment: Glucose reference range applies only to samples taken after fasting for at least 8 hours.   BUN 12 6 - 20 mg/dL   Creatinine, Ser 6.21 0.61 - 1.24 mg/dL   Calcium 9.3 8.9 - 30.8 mg/dL   Total Protein 6.7 6.5 - 8.1 g/dL   Albumin 4.6 3.5 - 5.0 g/dL   AST 19 15 - 41 U/L   ALT 13 0 - 44 U/L   Alkaline Phosphatase 50 38 - 126 U/L   Total Bilirubin 0.3 0.0 - 1.2 mg/dL   GFR, Estimated >65 >78 mL/min    Comment: (NOTE) Calculated using the CKD-EPI Creatinine Equation (2021)    Anion gap 15 5 - 15    Comment: Performed at [facility], [address] [place], [place] [address]  Protime-INR     Status: Abnormal   Collection Time: 11/01/23 12:14 AM  Result Value Ref Range   Prothrombin Time 15.7 (H) 11.4 - 15.2 seconds   INR 1.2 0.8 - 1.2    Comment: (NOTE) INR goal varies based on device and disease states. Performed at [facility], [address] [place], [place] [address]   Lactic acid, plasma  Status: None   Collection Time: 11/01/23 12:14 AM  Result Value Ref Range   Lactic Acid, Venous 1.2 0.5 - 1.9 mmol/L    Comment: Performed at [facility], [address] [place], [place] [address]  Urinalysis, Routine w reflex microscopic -Urine, Clean Catch     Status: Abnormal    Collection Time: 11/01/23  1:56 AM  Result Value Ref Range   Color, Urine YELLOW YELLOW   APPearance CLEAR CLEAR   Specific Gravity, Urine 1.015 1.005 - 1.030   pH 7.0 5.0 - 8.0   Glucose, UA NEGATIVE NEGATIVE mg/dL   Hgb urine dipstick NEGATIVE NEGATIVE   Bilirubin Urine NEGATIVE NEGATIVE   Ketones, ur 40 (A) NEGATIVE mg/dL   Protein, ur NEGATIVE NEGATIVE mg/dL   Nitrite NEGATIVE NEGATIVE   Leukocytes,Ua NEGATIVE NEGATIVE    Comment: Microscopic not done on urines with negative protein, blood, leukocytes, nitrite, or glucose < 500 mg/dL. Performed at [facility], [address] [place], [place] [address]     Imaging / Studies: CT Angio Chest PE W and/or Wo Contrast Result Date: 11/01/2023 CLINICAL DATA:  Pulmonary embolism (PE) , high prob; Abdominal pain, post-op post op rectal pain, fever, syncope. Status post hemorrhoidectomy. Multiple syncopal episodes EXAM: CT ANGIOGRAPHY CHEST CT ABDOMEN AND PELVIS WITH CONTRAST TECHNIQUE: Multidetector CT imaging of the chest was performed using the standard protocol during bolus administration of intravenous contrast. Multiplanar CT image reconstructions and MIPs were obtained to evaluate the vascular anatomy. Multidetector CT imaging of the abdomen and pelvis was performed using the standard protocol during bolus administration of intravenous contrast. RADIATION DOSE REDUCTION: This exam was performed according to the departmental dose-optimization program which includes automated exposure control, adjustment of the mA and/or kV according to patient size and/or use of iterative reconstruction technique. CONTRAST:  100mL OMNIPAQUE IOHEXOL 350 MG/ML SOLN COMPARISON:  None Available. FINDINGS: CTA CHEST FINDINGS Cardiovascular: Adequate opacification of the pulmonary arterial tree. No intraluminal filling defect identified to suggest acute pulmonary embolism. Central pulmonary arteries are of normal caliber. No significant coronary artery  calcification. Cardiac size within normal limits. No pericardial effusion. Mild atherosclerotic calcification within the thoracic aorta. Shallow ductus diverticulum. No aortic aneurysm. Mediastinum/Nodes: No enlarged mediastinal, hilar, or axillary lymph nodes. Thyroid gland, trachea, and esophagus demonstrate no significant findings. Lungs/Pleura: Lungs are clear. No pleural effusion or pneumothorax. Musculoskeletal: No chest wall abnormality. No acute or significant osseous findings. Review of the MIP images confirms the above findings. CT ABDOMEN and PELVIS FINDINGS Hepatobiliary: No focal liver abnormality is seen. No gallstones, gallbladder wall thickening, or biliary dilatation. Pancreas: Unremarkable Spleen: Unremarkable Adrenals/Urinary Tract: Adrenal glands are unremarkable. Kidneys are normal, without renal calculi, focal lesion, or hydronephrosis. Bladder is unremarkable. Stomach/Bowel: Mild perirectal infiltration, nonspecific, possibly postsurgical in nature or reflective of mild underlying proctitis. No evidence of obstruction or perforation. Stomach, small bowel, and large bowel are otherwise unremarkable. Appendix normal. No free intraperitoneal gas or fluid. Vascular/Lymphatic: No significant vascular findings are present. No enlarged abdominal or pelvic lymph nodes. Reproductive: Prostate is unremarkable. Other: No abdominal wall hernia. Musculoskeletal: No acute or significant osseous findings. Review of the MIP images confirms the above findings. IMPRESSION: 1. No evidence of acute pulmonary embolism. 2. Mild perirectal infiltration, nonspecific, possibly postsurgical in nature or reflective of mild underlying proctitis. No evidence of obstruction or perforation. Electronically Signed   By: Worthy Heads M.D.   On: 11/01/2023 04:17   CT ABDOMEN PELVIS W CONTRAST  Result Date: 11/01/2023 CLINICAL DATA:  Pulmonary embolism (PE) , high prob; Abdominal pain, post-op post op rectal pain, fever,  syncope. Status post hemorrhoidectomy. Multiple syncopal episodes EXAM: CT ANGIOGRAPHY CHEST CT ABDOMEN AND PELVIS WITH CONTRAST TECHNIQUE: Multidetector CT imaging of the chest was performed using the standard protocol during bolus administration of intravenous contrast. Multiplanar CT image reconstructions and MIPs were obtained to evaluate the vascular anatomy. Multidetector CT imaging of the abdomen and pelvis was performed using the standard protocol during bolus administration of intravenous contrast. RADIATION DOSE REDUCTION: This exam was performed according to the departmental dose-optimization program which includes automated exposure control, adjustment of the mA and/or kV according to patient size and/or use of iterative reconstruction technique. CONTRAST:  100mL OMNIPAQUE IOHEXOL 350 MG/ML SOLN COMPARISON:  None Available. FINDINGS: CTA CHEST FINDINGS Cardiovascular: Adequate opacification of the pulmonary arterial tree. No intraluminal filling defect identified to suggest acute pulmonary embolism. Central pulmonary arteries are of normal caliber. No significant coronary artery calcification. Cardiac size within normal limits. No pericardial effusion. Mild atherosclerotic calcification within the thoracic aorta. Shallow ductus diverticulum. No aortic aneurysm. Mediastinum/Nodes: No enlarged mediastinal, hilar, or axillary lymph nodes. Thyroid gland, trachea, and esophagus demonstrate no significant findings. Lungs/Pleura: Lungs are clear. No pleural effusion or pneumothorax. Musculoskeletal: No chest wall abnormality. No acute or significant osseous findings. Review of the MIP images confirms the above findings. CT ABDOMEN and PELVIS FINDINGS Hepatobiliary: No focal liver abnormality is seen. No gallstones, gallbladder wall thickening, or biliary dilatation. Pancreas: Unremarkable Spleen: Unremarkable Adrenals/Urinary Tract: Adrenal glands are unremarkable. Kidneys are normal, without renal calculi,  focal lesion, or hydronephrosis. Bladder is unremarkable. Stomach/Bowel: Mild perirectal infiltration, nonspecific, possibly postsurgical in nature or reflective of mild underlying proctitis. No evidence of obstruction or perforation. Stomach, small bowel, and large bowel are otherwise unremarkable. Appendix normal. No free intraperitoneal gas or fluid. Vascular/Lymphatic: No significant vascular findings are present. No enlarged abdominal or pelvic lymph nodes. Reproductive: Prostate is unremarkable. Other: No abdominal wall hernia. Musculoskeletal: No acute or significant osseous findings. Review of the MIP images confirms the above findings. IMPRESSION: 1. No evidence of acute pulmonary embolism. 2. Mild perirectal infiltration, nonspecific, possibly postsurgical in nature or reflective of mild underlying proctitis. No evidence of obstruction or perforation. Electronically Signed   By: Worthy Heads M.D.   On: 11/01/2023 04:17    Medications / Allergies: per chart  Antibiotics: Anti-infectives (From admission, onward)    Start     Dose/Rate Route Frequency Ordered Stop   11/01/23 0230  ceFEPIme (MAXIPIME) 2 g in sodium chloride  0.9 % 100 mL IVPB        2 g 200 mL/hr over 30 Minutes Intravenous  Once 11/01/23 0224 11/01/23 0621   11/01/23 0230  metroNIDAZOLE (FLAGYL) IVPB 500 mg        500 mg 100 mL/hr over 60 Minutes Intravenous  Once 11/01/23 0224 11/01/23 0411         Note: Portions of this report may have been transcribed using voice recognition software. Every effort was made to ensure accuracy; however, inadvertent computerized transcription errors may be present.   Any transcriptional errors that result from this process are unintentional.    Eddye Goodie, MD, FACS, MASCRS Esophageal, Gastrointestinal & Colorectal Surgery Robotic and Minimally Invasive Surgery  [facility] [address] [place], [place]  [address] [phone] Fax [phone] Main  CONTACT INFORMATION: Weekday (9AM-5PM): Call [facility] main office at  [phone] Weeknight (5PM-9AM) or Weekend/Holiday: Check EPIC "Web Links" tab & use "AMION" (password " [email or web address]") for General Surgery [facility] coverage  Please, DO NOT use SecureChat  (it is not reliable communication to reach operating surgeons & will lead to a delay in care).   Epic staff messaging available for outptient concerns needing 1-2 business day response.       11/01/2023  8:29 AM

## 2023-11-01 NOTE — ED Provider Notes (Signed)
 [place] [facility] Provider Note   CSN: [id number] Arrival date & time: 10/31/23  2331     History  Chief Complaint  Patient presents with   Loss of Consciousness    Karl Flores is a 28 y.o. male.  Patient underwent hemorrhoidectomy today by Dr. Hershell Lose.  He comes in with severe pain, nausea, dizziness, lightheadedness, multiple syncopal episodes and fever to 101.  Taking oxycodone  at home for pain without relief.  Denies any significant bleeding.  States his surgery was completed this morning and he is felt poorly all day with increasing rectal pain and several syncopal episodes to the point where he stands up he gets dizzy and loses consciousness.  No head injury.  No chest pain or shortness of breath.  Denies any blood thinner use.  Has not noticed any significant bleeding.  no no runny nose or sore throat.  No pain with urination or blood in the urine.  No significant abdominal pain  The history is provided by the patient.  Loss of Consciousness Associated symptoms: dizziness and weakness   Associated symptoms: no chest pain, no fever, no headaches, no nausea and no vomiting        Home Medications Prior to Admission medications   Medication Sig Start Date End Date Taking? Authorizing Provider  celecoxib  (CELEBREX ) 100 MG capsule Take 1 capsule (100 mg total) by mouth 2 (two) times daily. 07/04/23 07/03/24  Magnant, Justice Olp, PA-C  hydrocortisone  (ANUSOL -HC) 25 MG suppository Place 1 suppository (25 mg total) rectally 2 (two) times daily. 07/29/23   Graig Lawyer, MD      Allergies    Penicillins    Review of Systems   Review of Systems  Constitutional:  Negative for activity change, appetite change and fever.  HENT:  Negative for congestion.   Respiratory:  Negative for cough and chest tightness.   Cardiovascular:  Positive for syncope. Negative for chest pain.  Gastrointestinal:  Positive for rectal pain. Negative for abdominal  pain, nausea and vomiting.  Genitourinary:  Negative for dysuria and hematuria.  Musculoskeletal:  Negative for arthralgias and myalgias.  Neurological:  Positive for dizziness, syncope, weakness and light-headedness. Negative for headaches.    all other systems are negative except as noted in the HPI and PMH.   Physical Exam Updated Vital Signs BP (!) 89/55 (BP Location: Left Arm)   Pulse (!) 51   Temp 99.9 F (37.7 C) (Oral)   Resp 12   Ht 6' (1.829 m)   Wt 104.3 kg   SpO2 97%   BMI 31.19 kg/m  Physical Exam Vitals and nursing note reviewed.  Constitutional:      General: He is not in acute distress.    Appearance: He is well-developed. He is ill-appearing.  HENT:     Head: Normocephalic and atraumatic.     Mouth/Throat:     Pharynx: No oropharyngeal exudate.  Eyes:     Conjunctiva/sclera: Conjunctivae normal.     Pupils: Pupils are equal, round, and reactive to light.  Neck:     Comments: No meningismus. Cardiovascular:     Rate and Rhythm: Normal rate and regular rhythm.     Heart sounds: Normal heart sounds. No murmur heard. Pulmonary:     Effort: Pulmonary effort is normal. No respiratory distress.     Breath sounds: Normal breath sounds.  Abdominal:     Palpations: Abdomen is soft.     Tenderness: There is no abdominal  tenderness. There is no guarding or rebound.  Genitourinary:    Comments: Declines digital exam.  There is erythema surrounding anus with postsurgical changes with minor purulent drainage Musculoskeletal:        General: No tenderness. Normal range of motion.     Cervical back: Normal range of motion and neck supple.  Skin:    General: Skin is warm.  Neurological:     Mental Status: He is alert and oriented to person, place, and time.     Cranial Nerves: No cranial nerve deficit.     Motor: No abnormal muscle tone.     Coordination: Coordination normal.     Comments:  5/5 strength throughout. CN 2-12 intact.Equal grip strength.    Psychiatric:        Behavior: Behavior normal.     ED Results / Procedures / Treatments   Labs (all labs ordered are listed, but only abnormal results are displayed) Labs Reviewed  CBC WITH DIFFERENTIAL/PLATELET - Abnormal; Notable for the following components:      Result Value   WBC 13.3 (*)    Hemoglobin 11.9 (*)    HCT 35.6 (*)    Neutro Abs 11.5 (*)    All other components within normal limits  COMPREHENSIVE METABOLIC PANEL WITH GFR - Abnormal; Notable for the following components:   Glucose, Bld 126 (*)    All other components within normal limits  PROTIME-INR - Abnormal; Notable for the following components:   Prothrombin Time 15.7 (*)    All other components within normal limits  URINALYSIS, ROUTINE W REFLEX MICROSCOPIC - Abnormal; Notable for the following components:   Ketones, ur 40 (*)    All other components within normal limits  CULTURE, BLOOD (ROUTINE X 2)  CULTURE, BLOOD (ROUTINE X 2)  LACTIC ACID, PLASMA  LACTIC ACID, PLASMA    EKG EKG Interpretation Date/Time:  Thursday Oct 31 2023 23:55:17 EDT Ventricular Rate:  63 PR Interval:  162 QRS Duration:  104 QT Interval:  424 QTC Calculation: 434 R Axis:   95  Text Interpretation: Sinus arrhythmia Borderline right axis deviation ST elev, probable normal early repol pattern No significant change was found Confirmed by Earma Gloss [phone]) on 11/01/2023 12:12:57 AM  Radiology CT Angio Chest PE W and/or Wo Contrast Result Date: 11/01/2023 CLINICAL DATA:  Pulmonary embolism (PE) , high prob; Abdominal pain, post-op post op rectal pain, fever, syncope. Status post hemorrhoidectomy. Multiple syncopal episodes EXAM: CT ANGIOGRAPHY CHEST CT ABDOMEN AND PELVIS WITH CONTRAST TECHNIQUE: Multidetector CT imaging of the chest was performed using the standard protocol during bolus administration of intravenous contrast. Multiplanar CT image reconstructions and MIPs were obtained to evaluate the vascular anatomy.  Multidetector CT imaging of the abdomen and pelvis was performed using the standard protocol during bolus administration of intravenous contrast. RADIATION DOSE REDUCTION: This exam was performed according to the departmental dose-optimization program which includes automated exposure control, adjustment of the mA and/or kV according to patient size and/or use of iterative reconstruction technique. CONTRAST:  100mL OMNIPAQUE IOHEXOL 350 MG/ML SOLN COMPARISON:  None Available. FINDINGS: CTA CHEST FINDINGS Cardiovascular: Adequate opacification of the pulmonary arterial tree. No intraluminal filling defect identified to suggest acute pulmonary embolism. Central pulmonary arteries are of normal caliber. No significant coronary artery calcification. Cardiac size within normal limits. No pericardial effusion. Mild atherosclerotic calcification within the thoracic aorta. Shallow ductus diverticulum. No aortic aneurysm. Mediastinum/Nodes: No enlarged mediastinal, hilar, or axillary lymph nodes. Thyroid gland, trachea, and esophagus demonstrate no  significant findings. Lungs/Pleura: Lungs are clear. No pleural effusion or pneumothorax. Musculoskeletal: No chest wall abnormality. No acute or significant osseous findings. Review of the MIP images confirms the above findings. CT ABDOMEN and PELVIS FINDINGS Hepatobiliary: No focal liver abnormality is seen. No gallstones, gallbladder wall thickening, or biliary dilatation. Pancreas: Unremarkable Spleen: Unremarkable Adrenals/Urinary Tract: Adrenal glands are unremarkable. Kidneys are normal, without renal calculi, focal lesion, or hydronephrosis. Bladder is unremarkable. Stomach/Bowel: Mild perirectal infiltration, nonspecific, possibly postsurgical in nature or reflective of mild underlying proctitis. No evidence of obstruction or perforation. Stomach, small bowel, and large bowel are otherwise unremarkable. Appendix normal. No free intraperitoneal gas or fluid.  Vascular/Lymphatic: No significant vascular findings are present. No enlarged abdominal or pelvic lymph nodes. Reproductive: Prostate is unremarkable. Other: No abdominal wall hernia. Musculoskeletal: No acute or significant osseous findings. Review of the MIP images confirms the above findings. IMPRESSION: 1. No evidence of acute pulmonary embolism. 2. Mild perirectal infiltration, nonspecific, possibly postsurgical in nature or reflective of mild underlying proctitis. No evidence of obstruction or perforation. Electronically Signed   By: Worthy Heads M.D.   On: 11/01/2023 04:17   CT ABDOMEN PELVIS W CONTRAST Result Date: 11/01/2023 CLINICAL DATA:  Pulmonary embolism (PE) , high prob; Abdominal pain, post-op post op rectal pain, fever, syncope. Status post hemorrhoidectomy. Multiple syncopal episodes EXAM: CT ANGIOGRAPHY CHEST CT ABDOMEN AND PELVIS WITH CONTRAST TECHNIQUE: Multidetector CT imaging of the chest was performed using the standard protocol during bolus administration of intravenous contrast. Multiplanar CT image reconstructions and MIPs were obtained to evaluate the vascular anatomy. Multidetector CT imaging of the abdomen and pelvis was performed using the standard protocol during bolus administration of intravenous contrast. RADIATION DOSE REDUCTION: This exam was performed according to the departmental dose-optimization program which includes automated exposure control, adjustment of the mA and/or kV according to patient size and/or use of iterative reconstruction technique. CONTRAST:  100mL OMNIPAQUE IOHEXOL 350 MG/ML SOLN COMPARISON:  None Available. FINDINGS: CTA CHEST FINDINGS Cardiovascular: Adequate opacification of the pulmonary arterial tree. No intraluminal filling defect identified to suggest acute pulmonary embolism. Central pulmonary arteries are of normal caliber. No significant coronary artery calcification. Cardiac size within normal limits. No pericardial effusion. Mild  atherosclerotic calcification within the thoracic aorta. Shallow ductus diverticulum. No aortic aneurysm. Mediastinum/Nodes: No enlarged mediastinal, hilar, or axillary lymph nodes. Thyroid gland, trachea, and esophagus demonstrate no significant findings. Lungs/Pleura: Lungs are clear. No pleural effusion or pneumothorax. Musculoskeletal: No chest wall abnormality. No acute or significant osseous findings. Review of the MIP images confirms the above findings. CT ABDOMEN and PELVIS FINDINGS Hepatobiliary: No focal liver abnormality is seen. No gallstones, gallbladder wall thickening, or biliary dilatation. Pancreas: Unremarkable Spleen: Unremarkable Adrenals/Urinary Tract: Adrenal glands are unremarkable. Kidneys are normal, without renal calculi, focal lesion, or hydronephrosis. Bladder is unremarkable. Stomach/Bowel: Mild perirectal infiltration, nonspecific, possibly postsurgical in nature or reflective of mild underlying proctitis. No evidence of obstruction or perforation. Stomach, small bowel, and large bowel are otherwise unremarkable. Appendix normal. No free intraperitoneal gas or fluid. Vascular/Lymphatic: No significant vascular findings are present. No enlarged abdominal or pelvic lymph nodes. Reproductive: Prostate is unremarkable. Other: No abdominal wall hernia. Musculoskeletal: No acute or significant osseous findings. Review of the MIP images confirms the above findings. IMPRESSION: 1. No evidence of acute pulmonary embolism. 2. Mild perirectal infiltration, nonspecific, possibly postsurgical in nature or reflective of mild underlying proctitis. No evidence of obstruction or perforation. Electronically Signed   By: Phylis Breeding.D.  On: 11/01/2023 04:17    Procedures .Critical Care  Performed by: Earma Gloss, MD Authorized by: Earma Gloss, MD   Critical care provider statement:    Critical care time (minutes):  35   Critical care time was exclusive of:  Separately billable  procedures and treating other patients   Critical care was necessary to treat or prevent imminent or life-threatening deterioration of the following conditions:  Sepsis   Critical care was time spent personally by me on the following activities:  Development of treatment plan with patient or surrogate, discussions with consultants, evaluation of patient's response to treatment, examination of patient, ordering and review of laboratory studies, ordering and review of radiographic studies, ordering and performing treatments and interventions, pulse oximetry, re-evaluation of patient's condition, review of old charts, blood draw for specimens and obtaining history from patient or surrogate   I assumed direction of critical care for this patient from another provider in my specialty: no     Care discussed with: admitting provider       Medications Ordered in ED Medications  sodium chloride  0.9 % bolus 1,000 mL (has no administration in time range)  ondansetron  (ZOFRAN ) injection 4 mg (has no administration in time range)  HYDROmorphone  (DILAUDID ) injection 1 mg (has no administration in time range)    ED Course/ Medical Decision Making/ A&P                                 Medical Decision Making Amount and/or Complexity of Data Reviewed Labs: ordered. Decision-making details documented in ED Course. Radiology: ordered and independent interpretation performed. Decision-making details documented in ED Course. ECG/medicine tests: ordered and independent interpretation performed. Decision-making details documented in ED Course.  Risk Prescription drug management.   Hemorrhoid surgery today here with syncope, fever, hypotension and pain.  Hypotensive on arrival.  Will hydrate, check labs, EKG.  EKG is sinus rhythm with no acute ST changes.  No Brugada, no prolonged QT.  White count 13.  Hemoglobin stable at 11.9. Will hydrate, check labs, likely CT imaging.  Prophylactic IV fluids and  antibiotics given.  Discussed with Dr. Ramiro Burly of surgery who is covering for Dr. Hershell Lose.  She agrees with CT scan and empiric antibiotics.  Question early sepsis.  Will hydrate.  Will give IV antibiotics and IV fluids after cultures are obtained.  Initial hypotension has improved with IV fluids.  Empiric workup pursued for possible infectious etiology.  Broad-spectrum antibiotics given after cultures are obtained.  Labs reassuring.  Syncopal episodes likely secondary to pain.  EKG without acute ischemia.  No Brugada, no prolonged QT.  CTPA only embolism study is negative.  CT abdomen pelvis shows postoperative changes without drainable abscess or fluid collection.  Some rectal inflammation consistent with proctitis.  Discussed with Dr. Ramiro Burly of surgery.  She request transfer to [facility] for Dr. Hershell Lose to evaluate later this morning.  Patient with difficulty with urination.  Foley catheter placed.  This may be causing some of his discomfort as well.  Patient to be transferred to [facility] for evaluation by general surgery later this morning.  Dr. Inga Manges accepting at [facility] long/         Final Clinical Impression(s) / ED Diagnoses Final diagnoses:  Postoperative pain  Post-procedural fever  Syncope, unspecified syncope type    Rx / DC Orders ED Discharge Orders     None  Earma Gloss, MD 11/01/23 [id number]

## 2023-11-01 NOTE — ED Provider Notes (Addendum)
 Transfer from [facility] ED by Dr. Earma Gloss. Please see prior provider note for more detail.   Briefly: Patient is 28 y.o. who underwent hemorrhoidectomy yesterday by Dr. Hershell Lose.  On initial presentation there, was concern for sepsis.  Was advised by surgery to transfer to [name] to be evaluated by their service today.  DDX: concern for post surgical complication, sepsis, other  Plan: Reach out to Dr. Hershell Lose of general surgery for further management and bedside eval   Physical Exam  BP (!) 118/55 (BP Location: Left Arm)   Pulse 77   Temp 97.9 F (36.6 C) (Oral)   Resp 18   Ht 6' (1.829 m)   Wt 104.3 kg   SpO2 100%   BMI 31.19 kg/m   Physical Exam  Procedures  Procedures  ED Course / MDM    Medical Decision Making Amount and/or Complexity of Data Reviewed Labs: ordered. Radiology: ordered. ECG/medicine tests: ordered.  Risk Prescription drug management.   On my assessment, patient was hemodynamically stable and well appearing, afebrile.  Dr. Avanell Bob was able to evaluate the patient at bedside and stated that he was appropriate for discharge.  States he would need to come back in a few days to remove the Foley.  We discussed return precautions.  Discharged.       Janalee Mcmurray, PA-C 11/01/23 0901    Janalee Mcmurray, PA-C 11/01/23 4540    Burnette Carte, MD 11/01/23 [phone]

## 2023-11-01 NOTE — ED Notes (Signed)
-  Called [facility] at 453am for transportation to [facility] ED - Dr. Inga Manges accepting.

## 2023-11-01 NOTE — ED Notes (Signed)
 Bladder scan completed. Pt retaining 405 ml. EDP aware. Will place indwelling foley.

## 2023-11-06 LAB — CULTURE, BLOOD (ROUTINE X 2)
Culture: NO GROWTH
Special Requests: ADEQUATE

## 2024-02-13 NOTE — Progress Notes (Deleted)
    Ben Jackson D.[facility] [address] [place] [address] Phone: [phone]   Assessment and Plan:     ***    Pertinent previous records reviewed include ***   Follow Up: ***     Subjective:   I, Karl Flores, am serving as a Neurosurgeon for Doctor Morene Mace   Chief Complaint: pectoralis pain    HPI:    05/04/21 Patient is a 28 year old male presenting with right sided chest pain from a previous injury in 2020. Patient states the he was doing a bench press when he heard a pop and then immediate pain on the right side of the chest wall that caused him to throw up. Patient states this area was severely bruised for awhile and since then has been having ongoing pain and weakness in this area that gets worse with pushing forward with right arm or chest flies. Patient was seen by his PCP 05/02/21 for this reason and referred to Sports medicine for further evaluation and treatment.    06/15/2021 Patient states that he is still doing pt , feels like healed wrong so he feels like no matter what he does its still going to be there. No pain at all, still doesn't have full strength yet  02/14/2024 Patient states   Relevant Historical Information: None pertinent  Additional pertinent review of systems negative.   Current Outpatient Medications:    celecoxib  (CELEBREX ) 100 MG capsule, Take 1 capsule (100 mg total) by mouth 2 (two) times daily., Disp: 20 capsule, Rfl: 2   diazepam  (VALIUM ) 5 MG tablet, Take 1 tablet (5 mg total) by mouth every 8 (eight) hours as needed for muscle spasms (difficulty urinating)., Disp: 6 tablet, Rfl: 1   hydrocortisone  (ANUSOL -HC) 25 MG suppository, Place 1 suppository (25 mg total) rectally 2 (two) times daily., Disp: 12 suppository, Rfl: 0   oxyCODONE -acetaminophen  (PERCOCET) 10-325 MG tablet, Take 1 tablet by mouth every 6 (six) hours as needed for pain., Disp: 30 tablet, Rfl: 0   Objective:     There  were no vitals filed for this visit.    There is no height or weight on file to calculate BMI.    Physical Exam:    ***   Electronically signed by:  Odis Mace D.[facility] 7:41 AM 02/13/24

## 2024-02-14 ENCOUNTER — Ambulatory Visit: Admitting: Sports Medicine

## 2024-02-18 ENCOUNTER — Ambulatory Visit: Admitting: Family Medicine

## 2024-03-01 ENCOUNTER — Other Ambulatory Visit: Payer: Self-pay

## 2024-03-01 ENCOUNTER — Encounter (HOSPITAL_BASED_OUTPATIENT_CLINIC_OR_DEPARTMENT_OTHER): Payer: Self-pay | Admitting: Emergency Medicine

## 2024-03-01 ENCOUNTER — Emergency Department (HOSPITAL_BASED_OUTPATIENT_CLINIC_OR_DEPARTMENT_OTHER)
Admission: EM | Admit: 2024-03-01 | Discharge: 2024-03-01 | Disposition: A | Discharge status: Home / Self Care | Attending: Emergency Medicine | Admitting: Emergency Medicine

## 2024-03-01 DIAGNOSIS — R0981 Nasal congestion: Secondary | ICD-10-CM | POA: Diagnosis present

## 2024-03-01 DIAGNOSIS — J069 Acute upper respiratory infection, unspecified: Secondary | ICD-10-CM | POA: Insufficient documentation

## 2024-03-01 NOTE — ED Provider Notes (Signed)
 [place] [facility] Provider Note   CSN: [id number] Arrival date & time: 03/01/24  9766     Patient presents with: Nasal Congestion   Karl Flores is a 28 y.o. male.   Presents to the emergency department for evaluation of elevated blood pressure.  Patient reports that he has been sick for a couple of days.  He took NyQuil before going to bed.  He reports that he then got into an argument with his wife and started feeling lightheaded and some pressure in his chest.  He took his blood pressure and it was elevated.  He does not normally take any blood pressure medications.       Prior to Admission medications   Medication Sig Start Date End Date Taking? Authorizing Provider  celecoxib  (CELEBREX ) 100 MG capsule Take 1 capsule (100 mg total) by mouth 2 (two) times daily. 11/01/23 10/31/24  Sheldon Standing, MD  diazepam  (VALIUM ) 5 MG tablet Take 1 tablet (5 mg total) by mouth every 8 (eight) hours as needed for muscle spasms (difficulty urinating). 11/01/23   Sheldon Standing, MD  hydrocortisone  (ANUSOL -HC) 25 MG suppository Place 1 suppository (25 mg total) rectally 2 (two) times daily. 07/29/23   Thedora Garnette HERO, MD  oxyCODONE -acetaminophen  (PERCOCET) 10-325 MG tablet Take 1 tablet by mouth every 6 (six) hours as needed for pain. 11/01/23   Sheldon Standing, MD    Allergies: Penicillins    Review of Systems  Updated Vital Signs BP 139/86   Pulse 61   Temp 97.7 F (36.5 C) (Oral)   Resp 20   Ht 6' (1.829 m)   SpO2 99%   BMI 31.19 kg/m   Physical Exam Vitals and nursing note reviewed.  Constitutional:      General: He is not in acute distress.    Appearance: He is well-developed.  HENT:     Head: Normocephalic and atraumatic.     Mouth/Throat:     Mouth: Mucous membranes are moist.  Eyes:     General: Vision grossly intact. Gaze aligned appropriately.     Extraocular Movements: Extraocular movements intact.     Conjunctiva/sclera:  Conjunctivae normal.  Cardiovascular:     Rate and Rhythm: Normal rate and regular rhythm.     Pulses: Normal pulses.     Heart sounds: Normal heart sounds, S1 normal and S2 normal. No murmur heard.    No friction rub. No gallop.  Pulmonary:     Effort: Pulmonary effort is normal. No respiratory distress.     Breath sounds: Normal breath sounds.  Abdominal:     Palpations: Abdomen is soft.     Tenderness: There is no abdominal tenderness. There is no guarding or rebound.     Hernia: No hernia is present.  Musculoskeletal:        General: No swelling.     Cervical back: Full passive range of motion without pain, normal range of motion and neck supple. No pain with movement, spinous process tenderness or muscular tenderness. Normal range of motion.     Right lower leg: No edema.     Left lower leg: No edema.  Skin:    General: Skin is warm and dry.     Capillary Refill: Capillary refill takes less than 2 seconds.     Findings: No ecchymosis, erythema, lesion or wound.  Neurological:     Mental Status: He is alert and oriented to person, place, and time.     GCS:  GCS eye subscore is 4. GCS verbal subscore is 5. GCS motor subscore is 6.     Cranial Nerves: Cranial nerves 2-12 are intact.     Sensory: Sensation is intact.     Motor: Motor function is intact. No weakness or abnormal muscle tone.     Coordination: Coordination is intact.  Psychiatric:        Mood and Affect: Mood normal.        Speech: Speech normal.        Behavior: Behavior normal.     (all labs ordered are listed, but only abnormal results are displayed) Labs Reviewed - No data to display  EKG: EKG Interpretation Date/Time:  Sunday March 01 2024 03:28:26 EDT Ventricular Rate:  60 PR Interval:  168 QRS Duration:  99 QT Interval:  415 QTC Calculation: 415 R Axis:   90  Text Interpretation: Sinus arrhythmia Borderline right axis deviation ST elev, probable normal early repol pattern No significant change  since last tracing Confirmed by Haze Lonni PARAS [phone]) on 03/01/2024 3:29:53 AM  Radiology: No results found.   Procedures   Medications Ordered in the ED - No data to display                                  Medical Decision Making  Presents with URI symptoms.  Lungs are clear, no clinical signs of pneumonia.  Patient reports that he acutely felt ill tonight after an argument with his wife.  He checked his blood pressure and it was elevated.  Blood pressures are coming down here in the ED without intervention.  He does not have a history of hypertension.  Mildly elevated pressures are multifactorial including possible decongestant in the NyQuil, stress from the argument and concomitant illness.  Does not require intervention currently.     Final diagnoses:  Upper respiratory tract infection, unspecified type    ED Discharge Orders     None          Haze Lonni PARAS, MD 03/01/24 0330

## 2024-03-01 NOTE — ED Triage Notes (Signed)
 Reports having cold symptoms and congestion for a few days. States he took Nyquil then got into an argument with his wife while trying to go to sleep. He states that when this happened he got a HA and his BP was elevated. Pt calm, and in NAD.

## 2024-03-13 ENCOUNTER — Encounter: Payer: Self-pay | Admitting: Family Medicine

## 2024-03-13 ENCOUNTER — Ambulatory Visit (INDEPENDENT_AMBULATORY_CARE_PROVIDER_SITE_OTHER): Admitting: Family Medicine

## 2024-03-13 VITALS — BP 132/78 | HR 62 | Temp 97.2°F | Ht 72.0 in | Wt 231.0 lb

## 2024-03-13 DIAGNOSIS — Z9189 Other specified personal risk factors, not elsewhere classified: Secondary | ICD-10-CM

## 2024-03-13 DIAGNOSIS — F410 Panic disorder [episodic paroxysmal anxiety] without agoraphobia: Secondary | ICD-10-CM | POA: Diagnosis not present

## 2024-03-13 NOTE — Patient Instructions (Signed)
Deep Breathing Technique ? ?1. Find a comfortable, quiet place to sit or lie down. Choose a spot where you know you won't be disturbed. If sitting, keep your back straight and your feet flat on the floor. Close your eyes. ? ?2. Place one hand on your belly, just below your ribs. Place the other hand on your chest. ? ?3. Take a regular breath. ? ?4. Now take a slow, deep breath over about 6 seconds. Breathe in slowly through your nose. Pay attention as your belly swells up under your hand. ? ?5. Holding your breath, pause for a four to five seconds. ? ?6. Slowly breathe out through your mouth over about 6 seconds. Pay attention as the hand on your belly goes in with the breath. ? ?7. Now add images to your breathing. As you inhale, imagine that the air you're breathing is spreading relaxation and calmness throughout your body. ? ?8. As you exhale, imagine that your breath is whooshing away stress and tension. ? ?9. Try to deep breathe for 6-10 cycles. ? ?

## 2024-03-13 NOTE — Assessment & Plan Note (Signed)
 Mr. Karl Flores BP is normal. His episodes are c/w acute anxiety attacks/panic. Discussed him avoiding nicotine use and keeping caffeine use moderated. I will check screening labs for other associated conditions. I reviewed with him about using deep breathing techniques to help him manage his panic.

## 2024-03-13 NOTE — Progress Notes (Signed)
 [facility] [address] [place] [address] Dept: [phone] Dept Fax: [phone]  Office Visit  Subjective:    Patient ID: Karl Flores Salvage, male    DOB: 1996-05-28, 28 y.o..   MRN: [id number]  Chief Complaint  Patient presents with   Follow-up    C/o that BP might be elevated and wants blood work.   Referral to urology for fertility.   History of Present Illness:  Patient is in today noting occasional episodes that he is concerned might represent increased blood pressure. He notes that multiple members of his family have hypertension and are on medication. When he is upset, he can experience sudden headache, chest tightness, and tingling in the skin of his chest and arms.  He was told in the past that he may have been experiencing acute anxiety.  Karl Flores notes he and his wife have been attempting to conceive a child for 18 months. He admits that part of the issue has been that she has dyspareunia. As such, he is not able to fully penetrate her vagina. She is engaging in pelvic floor PT and this is moderately improved. He was advised to get checked to see if his sperm counts are normal.  Past Medical History: Patient Active Problem List   Diagnosis Date Noted   Hemorrhoids 07/29/2023   Rupture of anterior cruciate ligament of right knee 07/18/2023   Acute medial meniscus tear of left knee 07/18/2023   Acute lateral meniscus tear of left knee 07/18/2023   Past Surgical History:  Procedure Laterality Date   ANTERIOR CRUCIATE LIGAMENT REPAIR Left 07/04/2023   Procedure: LEFT KNEE REVISION ACL RECONSTRUCTION, BONE PATELLA TENDON BONE AUTOGRAFT;  Surgeon: Addie Cordella Hamilton, MD;  Location: [facility] OR;  Service: Orthopedics;  Laterality: Left;   COLONOSCOPY     HARDWARE REMOVAL Left 07/04/2023   Procedure: BILATERAL MENISCAL REPAIR;  Surgeon: Addie Cordella Hamilton, MD;  Location: [facility] OR;  Service: Orthopedics;  Laterality: Left;    hemmorhoidectomy     KNEE ARTHROSCOPY WITH ANTERIOR CRUCIATE LIGAMENT (ACL) REPAIR WITH HAMSTRING GRAFT Left 12/04/2016   Procedure: KNEE ARTHROSCOPY WITH ANTERIOR CRUCIATE LIGAMENT (ACL) REPAIR WITH HAMSTRING GRAFT WITH POSSIBLE POSTROLATERAL CORNER RECONSTRUCTION AND LATERAL MENISCAL REPAIR VERSUS RESECTION;  Surgeon: Addie Hamilton Cordella, MD;  Location: [facility] OR;  Service: Orthopedics;  Laterality: Left;   KNEE ARTHROSCOPY WITH MENISCAL REPAIR Left 12/04/2016   Procedure: KNEE ARTHROSCOPY WITH MENISCAL REPAIR;  Surgeon: Addie Hamilton Cordella, MD;  Location: [facility] OR;  Service: Orthopedics;  Laterality: Left;   WISDOM TOOTH EXTRACTION     Family History  Problem Relation Age of Onset   Diabetes Father    Hyperlipidemia Father    Hypertension Father    Heart disease Father    Ulcerative colitis Brother    Stroke Paternal Grandmother    Diabetes Paternal Grandfather    Heart disease Paternal Grandfather    Colon cancer Neg Hx    Esophageal cancer Neg Hx    Stomach cancer Neg Hx    Rectal cancer Neg Hx    Outpatient Medications Prior to Visit  Medication Sig Dispense Refill   diazepam  (VALIUM ) 5 MG tablet Take 1 tablet (5 mg total) by mouth every 8 (eight) hours as needed for muscle spasms (difficulty urinating). 6 tablet 1   celecoxib  (CELEBREX ) 100 MG capsule Take 1 capsule (100 mg total) by mouth 2 (two) times daily. 20 capsule 2   hydrocortisone  (ANUSOL -HC) 25 MG suppository Place 1  suppository (25 mg total) rectally 2 (two) times daily. 12 suppository 0   oxyCODONE -acetaminophen  (PERCOCET) 10-325 MG tablet Take 1 tablet by mouth every 6 (six) hours as needed for pain. 30 tablet 0   No facility-administered medications prior to visit.   Allergies  Allergen Reactions   Penicillins Other (See Comments)    Has patient had a PCN reaction causing immediate rash, facial/tongue/throat swelling, SOB or lightheadedness with hypotension: Unknown Has patient had a PCN reaction causing severe rash  involving mucus membranes or skin necrosis: Unknown Has patient had a PCN reaction that required hospitalization: Unknown Has patient had a PCN reaction occurring within the last 10 years: No Unknown Childhood reaction  If all of the above answers are NO, then may proceed with Cephalosporin use.       Objective:   Today's Vitals   03/13/24 1541  BP: 132/78  Pulse: 62  Temp: (!) 97.2 F (36.2 C)  TempSrc: Temporal  SpO2: 97%  Weight: 231 lb (104.8 kg)  Height: 6' (1.829 m)   Body mass index is 31.33 kg/m.   General: Well developed, well nourished. No acute distress. Lungs: Clear to auscultation bilaterally. No wheezing, rales or rhonchi. CV: RRR without murmurs or rubs. Pulses 2+ bilaterally. Psych: Alert and oriented. Normal mood and affect.  Health Maintenance Due  Topic Date Due   Hepatitis B Vaccines 19-59 Average Risk (1 of 3 - 19+ 3-dose series) Never done     Assessment & Plan:   Problem List Items Addressed This Visit       Other   Panic attack - Primary   Karl Flores BP is normal. His episodes are c/w acute anxiety attacks/panic. Discussed him avoiding nicotine use and keeping caffeine use moderated. I will check screening labs for other associated conditions. I reviewed with him about using deep breathing techniques to help him manage his panic.      Relevant Orders   Comprehensive metabolic panel with GFR   CBC   TSH   Hemoglobin A1c   Other Visit Diagnoses       At risk for fertility problems       Agree with need for semen analysis. I will refer him to urology for this.   Relevant Orders   Ambulatory referral to Urology       Return if symptoms worsen or fail to improve.   Garnette CHRISTELLA Simpler, MD

## 2024-03-14 ENCOUNTER — Encounter: Payer: Self-pay | Admitting: Family Medicine

## 2024-03-14 LAB — COMPREHENSIVE METABOLIC PANEL WITH GFR
ALT: 18 IU/L (ref 0–44)
AST: 17 IU/L (ref 0–40)
Albumin: 4.8 g/dL (ref 4.3–5.2)
Alkaline Phosphatase: 59 IU/L (ref 47–123)
BUN/Creatinine Ratio: 19 (ref 9–20)
BUN: 16 mg/dL (ref 6–20)
Bilirubin Total: 0.3 mg/dL (ref 0.0–1.2)
CO2: 24 mmol/L (ref 20–29)
Calcium: 9.6 mg/dL (ref 8.7–10.2)
Chloride: 102 mmol/L (ref 96–106)
Creatinine, Ser: 0.83 mg/dL (ref 0.76–1.27)
Globulin, Total: 2.1 g/dL (ref 1.5–4.5)
Glucose: 85 mg/dL (ref 70–99)
Potassium: 4.7 mmol/L (ref 3.5–5.2)
Sodium: 142 mmol/L (ref 134–144)
Total Protein: 6.9 g/dL (ref 6.0–8.5)
eGFR: 122 mL/min/1.73 (ref >59)

## 2024-03-14 LAB — CBC
Hematocrit: 45 % (ref 37.5–51.0)
Hemoglobin: 14.2 g/dL (ref 13.0–17.7)
MCH: 27.7 pg (ref 26.6–33.0)
MCHC: 31.6 g/dL (ref 31.5–35.7)
MCV: 88 fL (ref 79–97)
Platelets: 283 x10E3/uL (ref 150–450)
RBC: 5.13 x10E6/uL (ref 4.14–5.80)
RDW: 13.2 % (ref 11.6–15.4)
WBC: 4.1 x10E3/uL (ref 3.4–10.8)

## 2024-03-14 LAB — HEMOGLOBIN A1C
Est. average glucose Bld gHb Est-mCnc: 108 mg/dL
Hgb A1c MFr Bld: 5.4 % (ref 4.8–5.6)

## 2024-03-14 LAB — TSH: TSH: 1.01 u[IU]/mL (ref 0.450–4.500)

## 2024-03-16 ENCOUNTER — Ambulatory Visit: Payer: Self-pay | Admitting: Family Medicine

## 2024-03-18 ENCOUNTER — Ambulatory Visit: Admitting: Family Medicine

## 2024-03-18 NOTE — Progress Notes (Deleted)
 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   History of Present Illness:  Karl Flores is a 28 y.o. male who is seen in consultation from Thedora Garnette HERO, MD for evaluation of possible male infertility.  History:  Duration: {gen duration 3:310515} Prior pregnancies: {YES/NO:21197} Previous treatments: {yes/no:20286} Evaluation/treatment of wife: {yes/no:20286}  Sexual history:  ED: {yes/no:20286} Lubricants: {yes/no:20286} Timing of intercourse: {Desc; regular/irreg:14544} Frequency of intercourse: {Intercourse frequency:28790} Frequency of masturbation: {BJSREGULARITY:28791}  Childhood and Development:  GU anomalies: {yes/no:20286} Undescended testicle or orchidopexy: {yes/no:20286} Herniorrhaphy: {yes/no:20286} Testicular torsion: {yes/no:20286} Testicular trauma: {yes/no:20286} Y-V plasty of bladder: {yes/no:20286} Onset of puberty: {BJSNORMAL:28792}  Medical History:  Systemic illness: {NONE:21772} Previous chemotherapy or radiation: {yes/no:20286}  Surgical History:  Orchiectomy: {yes/no:20286} Retroperitoneal surgery: {yes/no:20286} Pelvic injury: {yes/no:20286} Pelvic, inguinal, scrotal surgery: {yes/no:20286} Herniorrhaphy: {yes/no:20286} TURP: {yes/no:20286}  Infections:  Recent high fevers: {yes/no:20286} Mumps orchitis: {yes/no:20286} History of venereal disease: {yes/no:20286} Tuberculosis: {yes/no:20286}  Toxin exposures:  Chemicals (pesticides): {yes/no:20286} Drugs: ( chemotherapy, cimetidine, sulfasalazine, Nitrofurantoin ) {yes/no:20286} Illicit drugs: (alcohol, marijuana, steroids) {yes/no:20286} Thermal exposure: {yes/no:20286} Radiation exposure: {yes/no:20286} Smoking: {yes/no:20286}  Family History:  Cystic fibrosis: {yes/no:20286} Androgen receptor deficiency: {yes/no:20286} Infertile first-degree relatives: {yes/no:20286}  Review of Systems:  Respiratory  infections: {yes/no:20286} Anosmia: {yes/no:20286} Galactorrhea: {yes/no:20286} Impaired visual fields: {yes/no:20286}    Past Medical History:  No past medical history on file.  Past Surgical History:  Past Surgical History:  Procedure Laterality Date   ANTERIOR CRUCIATE LIGAMENT REPAIR Left 07/04/2023   Procedure: LEFT KNEE REVISION ACL RECONSTRUCTION, BONE PATELLA TENDON BONE AUTOGRAFT;  Surgeon: Addie Cordella Hamilton, MD;  Location: [facility] OR;  Service: Orthopedics;  Laterality: Left;   COLONOSCOPY     HARDWARE REMOVAL Left 07/04/2023   Procedure: BILATERAL MENISCAL REPAIR;  Surgeon: Addie Cordella Hamilton, MD;  Location: [facility] OR;  Service: Orthopedics;  Laterality: Left;   hemmorhoidectomy     KNEE ARTHROSCOPY WITH ANTERIOR CRUCIATE LIGAMENT (ACL) REPAIR WITH HAMSTRING GRAFT Left 12/04/2016   Procedure: KNEE ARTHROSCOPY WITH ANTERIOR CRUCIATE LIGAMENT (ACL) REPAIR WITH HAMSTRING GRAFT WITH POSSIBLE POSTROLATERAL CORNER RECONSTRUCTION AND LATERAL MENISCAL REPAIR VERSUS RESECTION;  Surgeon: Addie Hamilton Cordella, MD;  Location: [facility] OR;  Service: Orthopedics;  Laterality: Left;   KNEE ARTHROSCOPY WITH MENISCAL REPAIR Left 12/04/2016   Procedure: KNEE ARTHROSCOPY WITH MENISCAL REPAIR;  Surgeon: Addie Hamilton Cordella, MD;  Location: [facility] OR;  Service: Orthopedics;  Laterality: Left;   WISDOM TOOTH EXTRACTION      Allergies:  Allergies  Allergen Reactions   Penicillins Other (See Comments)    Has patient had a PCN reaction causing immediate rash, facial/tongue/throat swelling, SOB or lightheadedness with hypotension: Unknown Has patient had a PCN reaction causing severe rash involving mucus membranes or skin necrosis: Unknown Has patient had a PCN reaction that required hospitalization: Unknown Has patient had a PCN reaction occurring within the last 10 years: No Unknown Childhood reaction  If all of the above answers are NO, then may proceed with Cephalosporin use.      Family History:   Family History  Problem Relation Age of Onset   Diabetes Father    Hyperlipidemia Father    Hypertension Father    Heart disease Father    Ulcerative colitis Brother    Stroke Paternal Grandmother    Diabetes Paternal Grandfather    Heart disease Paternal Grandfather    Colon cancer Neg Hx    Esophageal cancer Neg Hx    Stomach cancer Neg  Hx    Rectal cancer Neg Hx     Social History:  Social History   Tobacco Use   Smoking status: Former    Types: Cigarettes    Start date: 09/2023    Quit date: 2023    Years since quitting: 2.7   Smokeless tobacco: Never   Tobacco comments:    hookah once a week; smokes 1 cigarette a day  Vaping Use   Vaping status: Never Used  Substance Use Topics   Alcohol use: Never   Drug use: No    Review of symptoms:  Constitutional:  Negative for unexplained weight loss, night sweats, fever, chills ENT:  Negative for nose bleeds, sinus pain, painful swallowing CV:  Negative for chest pain, shortness of breath, exercise intolerance, palpitations, loss of consciousness Resp:  Negative for cough, wheezing, shortness of breath GI:  Negative for nausea, vomiting, diarrhea, bloody stools GU:  Positives noted in HPI; otherwise negative for gross hematuria, dysuria, urinary incontinence Neuro:  Negative for seizures, poor balance, limb weakness, slurred speech Psych:  Negative for lack of energy, depression, anxiety Endocrine:  Negative for polydipsia, polyuria, symptoms of hypoglycemia (dizziness, hunger, sweating) Hematologic:  Negative for anemia, purpura, petechia, prolonged or excessive bleeding, use of anticoagulants  Allergic:  Negative for difficulty breathing or choking as a result of exposure to anything; no shellfish allergy; no allergic response (rash/itch) to materials, foods  Physical exam: There were no vitals taken for this visit. GENERAL APPEARANCE:  Well appearing, well developed, well nourished, NAD HEENT: Atraumatic,  Normocephalic, oropharynx clear. NECK: Supple without lymphadenopathy or thyromegaly. LUNGS: Clear to auscultation bilaterally. HEART: Regular Rate and Rhythm without murmurs, gallops, or rubs. ABDOMEN: Soft, non-tender, No Masses. EXTREMITIES: Moves all extremities well.  Without clubbing, cyanosis, or edema. NEUROLOGIC:  Alert and oriented x 3, normal gait, CN II-XII grossly intact.  MENTAL STATUS:  Appropriate. BACK:  Non-tender to palpation.  No CVAT SKIN:  Warm, dry and intact.  GU: Penis:  {Exam; penis:5791} Meatus: {Meatus:15530} Scrotum: {pe scrotum:310183} Testis: {Exam; testicles:5790} Epididymis: {epididymis zkjf:688612}    Results: None

## 2024-03-19 ENCOUNTER — Ambulatory Visit: Admitting: Urology

## 2024-03-19 DIAGNOSIS — N469 Male infertility, unspecified: Secondary | ICD-10-CM

## 2024-03-31 NOTE — Progress Notes (Incomplete)
 Chief Complaint: No chief complaint on file.   History of Present Illness:  Karl Flores is a 28 y.o. male who is seen in consultation from Thedora Garnette HERO, MD for evaluation of ***.   Past Medical History:  No past medical history on file.  Past Surgical History:  Past Surgical History:  Procedure Laterality Date   ANTERIOR CRUCIATE LIGAMENT REPAIR Left 07/04/2023   Procedure: LEFT KNEE REVISION ACL RECONSTRUCTION, BONE PATELLA TENDON BONE AUTOGRAFT;  Surgeon: Addie Cordella Hamilton, MD;  Location: [facility] OR;  Service: Orthopedics;  Laterality: Left;   COLONOSCOPY     HARDWARE REMOVAL Left 07/04/2023   Procedure: BILATERAL MENISCAL REPAIR;  Surgeon: Addie Cordella Hamilton, MD;  Location: [facility] OR;  Service: Orthopedics;  Laterality: Left;   hemmorhoidectomy     KNEE ARTHROSCOPY WITH ANTERIOR CRUCIATE LIGAMENT (ACL) REPAIR WITH HAMSTRING GRAFT Left 12/04/2016   Procedure: KNEE ARTHROSCOPY WITH ANTERIOR CRUCIATE LIGAMENT (ACL) REPAIR WITH HAMSTRING GRAFT WITH POSSIBLE POSTROLATERAL CORNER RECONSTRUCTION AND LATERAL MENISCAL REPAIR VERSUS RESECTION;  Surgeon: Addie Hamilton Cordella, MD;  Location: [facility] OR;  Service: Orthopedics;  Laterality: Left;   KNEE ARTHROSCOPY WITH MENISCAL REPAIR Left 12/04/2016   Procedure: KNEE ARTHROSCOPY WITH MENISCAL REPAIR;  Surgeon: Addie Hamilton Cordella, MD;  Location: [facility] OR;  Service: Orthopedics;  Laterality: Left;   WISDOM TOOTH EXTRACTION      Allergies:  Allergies  Allergen Reactions   Penicillins Other (See Comments)    Has patient had a PCN reaction causing immediate rash, facial/tongue/throat swelling, SOB or lightheadedness with hypotension: Unknown Has patient had a PCN reaction causing severe rash involving mucus membranes or skin necrosis: Unknown Has patient had a PCN reaction that required hospitalization: Unknown Has patient had a PCN reaction occurring within the last 10 years: No Unknown Childhood reaction  If all of the above answers are NO,  then may proceed with Cephalosporin use.      Family History:  Family History  Problem Relation Age of Onset   Diabetes Father    Hyperlipidemia Father    Hypertension Father    Heart disease Father    Ulcerative colitis Brother    Stroke Paternal Grandmother    Diabetes Paternal Grandfather    Heart disease Paternal Grandfather    Colon cancer Neg Hx    Esophageal cancer Neg Hx    Stomach cancer Neg Hx    Rectal cancer Neg Hx     Social History:  Social History   Tobacco Use   Smoking status: Former    Types: Cigarettes    Start date: 09/2023    Quit date: 2023    Years since quitting: 2.7   Smokeless tobacco: Never   Tobacco comments:    hookah once a week; smokes 1 cigarette a day  Vaping Use   Vaping status: Never Used  Substance Use Topics   Alcohol use: Never   Drug use: No    Review of symptoms:  Constitutional:  Negative for unexplained weight loss, night sweats, fever, chills ENT:  Negative for nose bleeds, sinus pain, painful swallowing CV:  Negative for chest pain, shortness of breath, exercise intolerance, palpitations, loss of consciousness Resp:  Negative for cough, wheezing, shortness of breath GI:  Negative for nausea, vomiting, diarrhea, bloody stools GU:  Positives noted in HPI; otherwise negative for gross hematuria, dysuria, urinary incontinence Neuro:  Negative for seizures, poor balance, limb weakness, slurred speech Psych:  Negative for lack of energy, depression, anxiety Endocrine:  Negative for  polydipsia, polyuria, symptoms of hypoglycemia (dizziness, hunger, sweating) Hematologic:  Negative for anemia, purpura, petechia, prolonged or excessive bleeding, use of anticoagulants  Allergic:  Negative for difficulty breathing or choking as a result of exposure to anything; no shellfish allergy; no allergic response (rash/itch) to materials, foods  Physical exam: There were no vitals taken for this visit. GENERAL APPEARANCE:  Well appearing,  well developed, well nourished, NAD HEENT: Atraumatic, Normocephalic. NECK: Normal appearance LUNGS: Normal inspiratory and expiratory excursion HEART: Regular Rate ABDOMEN: ***. GU: Phallus normal, no lesions. Scrotal skin normal. Testicles/epididymal structures normal. Meatus normal. Normal anal sphincter tone, prostate ***mL, symmetric, non nodular, non tender. EXTREMITIES: Moves all extremities well.  Without clubbing, cyanosis, or edema. NEUROLOGIC:  Alert and oriented x 3, normal gait, CN II-XII grossly intact.  MENTAL STATUS:  Appropriate. SKIN:  Warm, dry and intact.    Results: No results found for this or any previous visit (from the past 24 hours).  I have reviewed referring/prior physicians notes  I have reviewed urinalysis  I have reviewed PSA results  I have reviewed prior imaging  I have reviewed urine culture results  Assessment: ***   Plan: ***

## 2024-04-01 ENCOUNTER — Ambulatory Visit (INDEPENDENT_AMBULATORY_CARE_PROVIDER_SITE_OTHER): Admitting: Urology

## 2024-04-01 VITALS — BP 120/87 | HR 90 | Ht 72.0 in | Wt 228.0 lb

## 2024-04-01 DIAGNOSIS — N469 Male infertility, unspecified: Secondary | ICD-10-CM

## 2024-04-01 DIAGNOSIS — Z3141 Encounter for fertility testing: Secondary | ICD-10-CM | POA: Diagnosis not present

## 2024-04-01 LAB — URINALYSIS, ROUTINE W REFLEX MICROSCOPIC
Bilirubin, UA: NEGATIVE
Glucose, UA: NEGATIVE
Ketones, UA: NEGATIVE
Leukocytes,UA: NEGATIVE
Nitrite, UA: NEGATIVE
Protein,UA: NEGATIVE
RBC, UA: NEGATIVE
Specific Gravity, UA: 1.025 (ref 1.005–1.030)
Urobilinogen, Ur: 0.2 mg/dL (ref 0.2–1.0)
pH, UA: 6 (ref 5.0–7.5)

## 2024-04-01 NOTE — Progress Notes (Signed)
 Chief Complaint:  Chief Complaint  Patient presents with   Infertility    History of Present Illness:  Karl Flores is a 28 y.o. male who is seen in consultation from Thedora Garnette HERO, MD for evaluation of infertility.  He and his wife have been married for about a year.  She is 70, he is 27.  They have been trying to have a pregnancy for about a year.  The overriding factor involved is significant dyspareunia.  Currently his wife is getting pelvic floor therapy for that.  He has had almost no ability to penetrate since they started having sex a year ago.  He is just here for infertility testing.  No prior pregnancies.  No problems obtaining or maintaining erections, no problems with ejaculation.  No prior scrotal trauma.  He wears boxer briefs.  He takes showers.   Past Medical History:  No past medical history on file.  Past Surgical History:  Past Surgical History:  Procedure Laterality Date   ANTERIOR CRUCIATE LIGAMENT REPAIR Left 07/04/2023   Procedure: LEFT KNEE REVISION ACL RECONSTRUCTION, BONE PATELLA TENDON BONE AUTOGRAFT;  Surgeon: Addie Cordella Hamilton, MD;  Location: [facility] OR;  Service: Orthopedics;  Laterality: Left;   COLONOSCOPY     HARDWARE REMOVAL Left 07/04/2023   Procedure: BILATERAL MENISCAL REPAIR;  Surgeon: Addie Cordella Hamilton, MD;  Location: [facility] OR;  Service: Orthopedics;  Laterality: Left;   hemmorhoidectomy     KNEE ARTHROSCOPY WITH ANTERIOR CRUCIATE LIGAMENT (ACL) REPAIR WITH HAMSTRING GRAFT Left 12/04/2016   Procedure: KNEE ARTHROSCOPY WITH ANTERIOR CRUCIATE LIGAMENT (ACL) REPAIR WITH HAMSTRING GRAFT WITH POSSIBLE POSTROLATERAL CORNER RECONSTRUCTION AND LATERAL MENISCAL REPAIR VERSUS RESECTION;  Surgeon: Addie Hamilton Cordella, MD;  Location: [facility] OR;  Service: Orthopedics;  Laterality: Left;   KNEE ARTHROSCOPY WITH MENISCAL REPAIR Left 12/04/2016   Procedure: KNEE ARTHROSCOPY WITH MENISCAL REPAIR;  Surgeon: Addie Hamilton Cordella, MD;  Location: [facility] OR;  Service:  Orthopedics;  Laterality: Left;   WISDOM TOOTH EXTRACTION      Allergies:  Allergies  Allergen Reactions   Penicillins Other (See Comments)    Has patient had a PCN reaction causing immediate rash, facial/tongue/throat swelling, SOB or lightheadedness with hypotension: Unknown Has patient had a PCN reaction causing severe rash involving mucus membranes or skin necrosis: Unknown Has patient had a PCN reaction that required hospitalization: Unknown Has patient had a PCN reaction occurring within the last 10 years: No Unknown Childhood reaction  If all of the above answers are NO, then may proceed with Cephalosporin use.      Family History:  Family History  Problem Relation Age of Onset   Diabetes Father    Hyperlipidemia Father    Hypertension Father    Heart disease Father    Ulcerative colitis Brother    Stroke Paternal Grandmother    Diabetes Paternal Grandfather    Heart disease Paternal Grandfather    Colon cancer Neg Hx    Esophageal cancer Neg Hx    Stomach cancer Neg Hx    Rectal cancer Neg Hx     Social History:  Social History   Tobacco Use   Smoking status: Former    Types: Cigarettes    Start date: 09/2023    Quit date: 2023    Years since quitting: 2.7   Smokeless tobacco: Never   Tobacco comments:    hookah once a week; smokes 1 cigarette a day  Vaping Use   Vaping status: Never Used  Substance  Use Topics   Alcohol use: Never   Drug use: No    Review of symptoms:  Constitutional:  Negative for unexplained weight loss, night sweats, fever, chills ENT:  Negative for nose bleeds, sinus pain, painful swallowing CV:  Negative for chest pain, shortness of breath, exercise intolerance, palpitations, loss of consciousness Resp:  Negative for cough, wheezing, shortness of breath GI:  Negative for nausea, vomiting, diarrhea, bloody stools GU:  Positives noted in HPI; otherwise negative for gross hematuria, dysuria, urinary incontinence Neuro:   Negative for seizures, poor balance, limb weakness, slurred speech Psych:  Negative for lack of energy, depression, anxiety Endocrine:  Negative for polydipsia, polyuria, symptoms of hypoglycemia (dizziness, hunger, sweating) Hematologic:  Negative for anemia, purpura, petechia, prolonged or excessive bleeding, use of anticoagulants  Allergic:  Negative for difficulty breathing or choking as a result of exposure to anything; no shellfish allergy; no allergic response (rash/itch) to materials, foods  Physical exam: BP 120/87   Pulse 90   Ht 6' (1.829 m)   Wt 228 lb (103.4 kg)   BMI 30.92 kg/m  GENERAL APPEARANCE:  Well appearing, well developed, well nourished, NAD HEENT: Atraumatic, Normocephalic. NECK: Normal appearance LUNGS: Normal inspiratory and expiratory excursion HEART: Regular Rate ABDOMEN: Mildly obese, no hernias GU: Phallus normal, no lesions. Scrotal skin normal. Testicles/epididymal structures normal. Meatus normal; EXTREMITIES: Moves all extremities well.  Without clubbing, cyanosis, or edema. NEUROLOGIC:  Alert and oriented x 3, normal gait, CN II-XII grossly intact.  MENTAL STATUS:  Appropriate. SKIN:  Warm, dry and intact.    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  We will send him results

## 2024-04-09 ENCOUNTER — Encounter: Payer: Self-pay | Admitting: Urology

## 2024-04-14 ENCOUNTER — Telehealth: Payer: Self-pay

## 2024-04-14 NOTE — Telephone Encounter (Signed)
-----   Message from Garnette HERO Dahlstedt sent at 04/13/2024 12:36 PM EDT ----- Please call patient-his sperm count is totally normal.  Okay to mail him his results. ----- Message ----- From: Carolee Madelin HERO Sent: 04/09/2024   9:15 AM EDT To: Garnette Shack, MD

## 2024-04-14 NOTE — Telephone Encounter (Signed)
 Spoke with pt in reference to semen analysis. Pt voiced understanding. Results mailed to pt.

## 2024-04-20 ENCOUNTER — Encounter: Payer: Self-pay | Admitting: Radiology

## 2024-07-22 ENCOUNTER — Ambulatory Visit: Admitting: Family Medicine

## 2024-07-24 ENCOUNTER — Encounter: Payer: Self-pay | Admitting: Family Medicine

## 2024-07-24 ENCOUNTER — Ambulatory Visit: Admitting: Family Medicine

## 2024-07-24 VITALS — BP 118/72 | HR 70 | Temp 97.2°F | Ht 72.0 in | Wt 234.8 lb

## 2024-07-24 DIAGNOSIS — E66811 Obesity, class 1: Secondary | ICD-10-CM | POA: Insufficient documentation

## 2024-07-24 DIAGNOSIS — R5383 Other fatigue: Secondary | ICD-10-CM | POA: Insufficient documentation

## 2024-07-24 NOTE — Progress Notes (Signed)
 " [facility] [address] [place] [address] Dept: [phone] Dept Fax: [phone]  Office Visit  Subjective:    Patient ID: Karl Flores, male    DOB: [birth year], 29 y.o..   MRN: [id number]  Chief Complaint  Patient presents with   Follow-up    C/o having some weight gain, feeling hungry all the time, and fatigue.     History of Present Illness:  Patient is in today noting issues with generalized fatigue and concern about progressive weight gain. he associates this with feeling hungry much of the time and binge eating behaviors. he notes his brother was recently diagnosed with low testosterone levels. he has not noted erectile issues. He did have fertility testing this past year, which showed normal sperm counts (and his wife has subsequently become pregnant). He is not exercising as much as in the past, partially due to the lack of energy.  Past Medical History: Patient Active Problem List   Diagnosis Date Noted   Panic attack 03/13/2024   Hemorrhoids 07/29/2023   Rupture of anterior cruciate ligament of right knee 07/18/2023   Acute medial meniscus tear of left knee 07/18/2023   Acute lateral meniscus tear of left knee 07/18/2023   Past Surgical History:  Procedure Laterality Date   ANTERIOR CRUCIATE LIGAMENT REPAIR Left 07/04/2023   Procedure: LEFT KNEE REVISION ACL RECONSTRUCTION, BONE PATELLA TENDON BONE AUTOGRAFT;  Surgeon: Addie Cordella Hamilton, MD;  Location: [facility] OR;  Service: Orthopedics;  Laterality: Left;   COLONOSCOPY     HARDWARE REMOVAL Left 07/04/2023   Procedure: BILATERAL MENISCAL REPAIR;  Surgeon: Addie Cordella Hamilton, MD;  Location: [facility] OR;  Service: Orthopedics;  Laterality: Left;   hemmorhoidectomy     KNEE ARTHROSCOPY WITH ANTERIOR CRUCIATE LIGAMENT (ACL) REPAIR WITH HAMSTRING GRAFT Left 12/04/2016   Procedure: KNEE ARTHROSCOPY WITH ANTERIOR CRUCIATE LIGAMENT (ACL) REPAIR WITH HAMSTRING GRAFT WITH  POSSIBLE POSTROLATERAL CORNER RECONSTRUCTION AND LATERAL MENISCAL REPAIR VERSUS RESECTION;  Surgeon: Addie Hamilton Cordella, MD;  Location: [facility] OR;  Service: Orthopedics;  Laterality: Left;   KNEE ARTHROSCOPY WITH MENISCAL REPAIR Left 12/04/2016   Procedure: KNEE ARTHROSCOPY WITH MENISCAL REPAIR;  Surgeon: Addie Hamilton Cordella, MD;  Location: [facility] OR;  Service: Orthopedics;  Laterality: Left;   WISDOM TOOTH EXTRACTION     Family History  Problem Relation Age of Onset   Diabetes Father    Hyperlipidemia Father    Hypertension Father    Heart disease Father    Ulcerative colitis Brother    Stroke Paternal Grandmother    Diabetes Paternal Grandfather    Heart disease Paternal Grandfather    Colon cancer Neg Hx    Esophageal cancer Neg Hx    Stomach cancer Neg Hx    Rectal cancer Neg Hx    No outpatient medications prior to visit.   No facility-administered medications prior to visit.   Allergies[1]   Objective:   Today's Vitals   07/24/24 1036  BP: 118/72  Pulse: 70  Temp: (!) 97.2 F (36.2 C)  TempSrc: Temporal  SpO2: 98%  Weight: 234 lb 12.8 oz (106.5 kg)  Height: 6' (1.829 m)   Body mass index is 31.84 kg/m.   General: Well developed, well nourished. No acute distress. Psych: Alert and oriented. Normal mood and affect.  Health Maintenance Due  Topic Date Due   Hepatitis B Vaccines 19-59 Average Risk (1 of 3 - 19+ 3-dose series) Never done     Assessment & Plan:  Problem List Items Addressed This Visit       Other   Class 1 obesity due to excess calories without serious comorbidity with body mass index (BMI) of 31.0 to 31.9 in adult   Discussed principles of weight management, including a foundation of a healthy, calorie-controlled diet and regular exercise. I recommend the patient achieve 150 minutes of moderate-intensity exercise weekly. We did discuss the role of medication to augment dietary and exercise efforts.       Other fatigue - Primary   I will request  albs to screen for common secondary causes of fatigue. We did discuss the importance of getting 7-9 hours of sleep each night, eating healthy meals regularly, doing regular exercise,a dn stress reduction. I will check a testosterone level and we did discuss two step testing if the first is low.      Relevant Orders   TSH   Comprehensive metabolic panel with GFR   CBC   Testosterone    Return if symptoms worsen or fail to improve.    Garnette CHRISTELLA Simpler, MD  I,Emily Lagle,acting as a scribe for Garnette CHRISTELLA Simpler, MD.,have documented all relevant documentation on the behalf of Garnette CHRISTELLA Simpler, MD.  I, Garnette CHRISTELLA Simpler, MD, have reviewed all documentation for this visit. The documentation on 07/24/2024 for the exam, diagnosis, procedures, and orders are all accurate and complete.      [1]  Allergies Allergen Reactions   Penicillins Other (See Comments)    Has patient had a PCN reaction causing immediate rash, facial/tongue/throat swelling, SOB or lightheadedness with hypotension: Unknown Has patient had a PCN reaction causing severe rash involving mucus membranes or skin necrosis: Unknown Has patient had a PCN reaction that required hospitalization: Unknown Has patient had a PCN reaction occurring within the last 10 years: No Unknown Childhood reaction  If all of the above answers are NO, then may proceed with Cephalosporin use.     "

## 2024-07-24 NOTE — Assessment & Plan Note (Signed)
 I will request albs to screen for common secondary causes of fatigue. We did discuss the importance of getting 7-9 hours of sleep each night, eating healthy meals regularly, doing regular exercise,a dn stress reduction. I will check a testosterone level and we did discuss two step testing if the first is low.

## 2024-07-24 NOTE — Assessment & Plan Note (Signed)
 Discussed principles of weight management, including a foundation of a healthy, calorie-controlled diet and regular exercise. I recommend the patient achieve 150 minutes of moderate-intensity exercise weekly. We did discuss the role of medication to augment dietary and exercise efforts.

## 2024-07-27 ENCOUNTER — Other Ambulatory Visit
